# Patient Record
Sex: Female | Born: 1935 | Race: White | Hispanic: No | Smoking: Current some day smoker
Health system: Southern US, Community
[De-identification: ages and names within clinical notes are randomized; demographics above are authoritative.]

## PROBLEM LIST (undated history)

## (undated) DIAGNOSIS — N390 Urinary tract infection, site not specified: Secondary | ICD-10-CM

## (undated) DIAGNOSIS — C50919 Malignant neoplasm of unspecified site of unspecified female breast: Secondary | ICD-10-CM

## (undated) DIAGNOSIS — I10 Essential (primary) hypertension: Secondary | ICD-10-CM

## (undated) DIAGNOSIS — J449 Chronic obstructive pulmonary disease, unspecified: Secondary | ICD-10-CM

## (undated) DIAGNOSIS — K449 Diaphragmatic hernia without obstruction or gangrene: Secondary | ICD-10-CM

## (undated) DIAGNOSIS — R011 Cardiac murmur, unspecified: Secondary | ICD-10-CM

## (undated) HISTORY — DX: Diaphragmatic hernia without obstruction or gangrene: K44.9

## (undated) HISTORY — DX: Essential (primary) hypertension: I10

## (undated) HISTORY — DX: Urinary tract infection, site not specified: N39.0

## (undated) HISTORY — PX: MASTECTOMY: SHX3

## (undated) HISTORY — DX: Malignant neoplasm of unspecified site of unspecified female breast: C50.919

## (undated) HISTORY — PX: FOOT SURGERY: SHX648

## (undated) HISTORY — PX: ABDOMINAL HYSTERECTOMY: SHX81

---

## 2004-09-05 ENCOUNTER — Ambulatory Visit: Payer: Self-pay | Admitting: Oncology

## 2004-09-18 ENCOUNTER — Ambulatory Visit: Payer: Self-pay | Admitting: Oncology

## 2004-10-08 ENCOUNTER — Ambulatory Visit: Payer: Self-pay | Admitting: General Practice

## 2005-03-06 ENCOUNTER — Ambulatory Visit: Payer: Self-pay | Admitting: Oncology

## 2005-03-18 ENCOUNTER — Ambulatory Visit: Payer: Self-pay | Admitting: Oncology

## 2005-07-25 ENCOUNTER — Ambulatory Visit: Payer: Self-pay | Admitting: Internal Medicine

## 2005-09-04 ENCOUNTER — Ambulatory Visit: Payer: Self-pay | Admitting: Oncology

## 2005-09-18 ENCOUNTER — Ambulatory Visit: Payer: Self-pay | Admitting: Oncology

## 2005-10-31 ENCOUNTER — Ambulatory Visit: Payer: Self-pay | Admitting: Gastroenterology

## 2005-11-08 ENCOUNTER — Ambulatory Visit: Payer: Self-pay | Admitting: Gastroenterology

## 2006-03-03 ENCOUNTER — Ambulatory Visit: Payer: Self-pay | Admitting: Oncology

## 2006-03-18 ENCOUNTER — Ambulatory Visit: Payer: Self-pay | Admitting: Oncology

## 2006-07-29 ENCOUNTER — Ambulatory Visit: Payer: Self-pay | Admitting: Internal Medicine

## 2006-09-03 ENCOUNTER — Ambulatory Visit: Payer: Self-pay | Admitting: Oncology

## 2006-09-18 ENCOUNTER — Ambulatory Visit: Payer: Self-pay | Admitting: Oncology

## 2007-02-17 ENCOUNTER — Ambulatory Visit: Payer: Self-pay | Admitting: Oncology

## 2007-03-04 ENCOUNTER — Ambulatory Visit: Payer: Self-pay | Admitting: Oncology

## 2007-03-19 ENCOUNTER — Ambulatory Visit: Payer: Self-pay | Admitting: Oncology

## 2007-04-21 ENCOUNTER — Ambulatory Visit: Payer: Self-pay | Admitting: Internal Medicine

## 2007-08-19 ENCOUNTER — Ambulatory Visit: Payer: Self-pay | Admitting: Oncology

## 2007-08-21 ENCOUNTER — Ambulatory Visit: Payer: Self-pay | Admitting: Oncology

## 2007-08-27 ENCOUNTER — Ambulatory Visit: Payer: Self-pay | Admitting: Oncology

## 2007-09-19 ENCOUNTER — Ambulatory Visit: Payer: Self-pay | Admitting: Oncology

## 2007-09-24 ENCOUNTER — Ambulatory Visit: Payer: Self-pay | Admitting: Internal Medicine

## 2007-11-17 ENCOUNTER — Ambulatory Visit: Payer: Self-pay | Admitting: Gastroenterology

## 2008-02-23 ENCOUNTER — Ambulatory Visit: Payer: Self-pay | Admitting: Oncology

## 2008-03-18 ENCOUNTER — Ambulatory Visit: Payer: Self-pay | Admitting: Oncology

## 2008-08-24 ENCOUNTER — Ambulatory Visit: Payer: Self-pay | Admitting: Oncology

## 2008-08-30 ENCOUNTER — Ambulatory Visit: Payer: Self-pay | Admitting: Oncology

## 2008-09-18 ENCOUNTER — Ambulatory Visit: Payer: Self-pay | Admitting: Oncology

## 2009-01-09 ENCOUNTER — Ambulatory Visit: Payer: Self-pay | Admitting: Internal Medicine

## 2009-02-16 ENCOUNTER — Ambulatory Visit: Payer: Self-pay | Admitting: Oncology

## 2009-02-28 ENCOUNTER — Ambulatory Visit: Payer: Self-pay | Admitting: Oncology

## 2009-03-18 ENCOUNTER — Ambulatory Visit: Payer: Self-pay | Admitting: Oncology

## 2009-08-18 ENCOUNTER — Ambulatory Visit: Payer: Self-pay | Admitting: Oncology

## 2009-08-29 ENCOUNTER — Ambulatory Visit: Payer: Self-pay | Admitting: Oncology

## 2009-09-18 ENCOUNTER — Ambulatory Visit: Payer: Self-pay | Admitting: Oncology

## 2010-02-16 ENCOUNTER — Ambulatory Visit: Payer: Self-pay | Admitting: Oncology

## 2010-02-26 ENCOUNTER — Ambulatory Visit: Payer: Self-pay | Admitting: Oncology

## 2010-03-15 ENCOUNTER — Ambulatory Visit: Payer: Self-pay | Admitting: Internal Medicine

## 2010-03-18 ENCOUNTER — Ambulatory Visit: Payer: Self-pay | Admitting: Oncology

## 2010-08-18 ENCOUNTER — Ambulatory Visit: Payer: Self-pay | Admitting: Oncology

## 2010-08-28 ENCOUNTER — Ambulatory Visit: Payer: Self-pay | Admitting: Radiation Oncology

## 2010-09-18 ENCOUNTER — Ambulatory Visit: Payer: Self-pay | Admitting: Radiation Oncology

## 2011-03-20 ENCOUNTER — Ambulatory Visit: Payer: Self-pay | Admitting: Oncology

## 2011-03-21 ENCOUNTER — Ambulatory Visit: Payer: Self-pay | Admitting: Oncology

## 2011-04-19 ENCOUNTER — Ambulatory Visit: Payer: Self-pay | Admitting: Oncology

## 2011-05-19 ENCOUNTER — Ambulatory Visit: Payer: Self-pay | Admitting: Oncology

## 2011-06-19 ENCOUNTER — Ambulatory Visit: Payer: Self-pay | Admitting: Oncology

## 2011-07-20 ENCOUNTER — Ambulatory Visit: Payer: Self-pay | Admitting: Oncology

## 2011-08-29 ENCOUNTER — Ambulatory Visit: Payer: Self-pay | Admitting: Oncology

## 2011-09-19 ENCOUNTER — Ambulatory Visit: Payer: Self-pay | Admitting: Oncology

## 2011-10-24 ENCOUNTER — Ambulatory Visit: Payer: Self-pay | Admitting: Oncology

## 2011-11-19 ENCOUNTER — Ambulatory Visit: Payer: Self-pay | Admitting: Oncology

## 2011-12-19 DIAGNOSIS — L570 Actinic keratosis: Secondary | ICD-10-CM | POA: Diagnosis not present

## 2011-12-19 DIAGNOSIS — Z85828 Personal history of other malignant neoplasm of skin: Secondary | ICD-10-CM | POA: Diagnosis not present

## 2012-01-28 ENCOUNTER — Ambulatory Visit: Payer: Self-pay | Admitting: Oncology

## 2012-01-28 DIAGNOSIS — D649 Anemia, unspecified: Secondary | ICD-10-CM | POA: Diagnosis not present

## 2012-01-28 LAB — CANCER CENTER HEMOGLOBIN: HGB: 9.5 g/dL — ABNORMAL LOW (ref 12.0–16.0)

## 2012-02-07 DIAGNOSIS — M546 Pain in thoracic spine: Secondary | ICD-10-CM | POA: Diagnosis not present

## 2012-02-07 DIAGNOSIS — M5020 Other cervical disc displacement, unspecified cervical region: Secondary | ICD-10-CM | POA: Diagnosis not present

## 2012-02-07 DIAGNOSIS — M999 Biomechanical lesion, unspecified: Secondary | ICD-10-CM | POA: Diagnosis not present

## 2012-02-07 DIAGNOSIS — M9981 Other biomechanical lesions of cervical region: Secondary | ICD-10-CM | POA: Diagnosis not present

## 2012-02-10 DIAGNOSIS — M546 Pain in thoracic spine: Secondary | ICD-10-CM | POA: Diagnosis not present

## 2012-02-10 DIAGNOSIS — M9981 Other biomechanical lesions of cervical region: Secondary | ICD-10-CM | POA: Diagnosis not present

## 2012-02-10 DIAGNOSIS — M5020 Other cervical disc displacement, unspecified cervical region: Secondary | ICD-10-CM | POA: Diagnosis not present

## 2012-02-10 DIAGNOSIS — M999 Biomechanical lesion, unspecified: Secondary | ICD-10-CM | POA: Diagnosis not present

## 2012-02-11 DIAGNOSIS — M546 Pain in thoracic spine: Secondary | ICD-10-CM | POA: Diagnosis not present

## 2012-02-11 DIAGNOSIS — M999 Biomechanical lesion, unspecified: Secondary | ICD-10-CM | POA: Diagnosis not present

## 2012-02-11 DIAGNOSIS — M5020 Other cervical disc displacement, unspecified cervical region: Secondary | ICD-10-CM | POA: Diagnosis not present

## 2012-02-11 DIAGNOSIS — M9981 Other biomechanical lesions of cervical region: Secondary | ICD-10-CM | POA: Diagnosis not present

## 2012-02-12 DIAGNOSIS — M546 Pain in thoracic spine: Secondary | ICD-10-CM | POA: Diagnosis not present

## 2012-02-12 DIAGNOSIS — M999 Biomechanical lesion, unspecified: Secondary | ICD-10-CM | POA: Diagnosis not present

## 2012-02-12 DIAGNOSIS — M5020 Other cervical disc displacement, unspecified cervical region: Secondary | ICD-10-CM | POA: Diagnosis not present

## 2012-02-12 DIAGNOSIS — M9981 Other biomechanical lesions of cervical region: Secondary | ICD-10-CM | POA: Diagnosis not present

## 2012-02-17 ENCOUNTER — Ambulatory Visit: Payer: Self-pay | Admitting: Oncology

## 2012-02-18 DIAGNOSIS — M9981 Other biomechanical lesions of cervical region: Secondary | ICD-10-CM | POA: Diagnosis not present

## 2012-02-18 DIAGNOSIS — M546 Pain in thoracic spine: Secondary | ICD-10-CM | POA: Diagnosis not present

## 2012-02-18 DIAGNOSIS — M5020 Other cervical disc displacement, unspecified cervical region: Secondary | ICD-10-CM | POA: Diagnosis not present

## 2012-02-18 DIAGNOSIS — M999 Biomechanical lesion, unspecified: Secondary | ICD-10-CM | POA: Diagnosis not present

## 2012-02-19 DIAGNOSIS — M546 Pain in thoracic spine: Secondary | ICD-10-CM | POA: Diagnosis not present

## 2012-02-19 DIAGNOSIS — M9981 Other biomechanical lesions of cervical region: Secondary | ICD-10-CM | POA: Diagnosis not present

## 2012-02-19 DIAGNOSIS — M999 Biomechanical lesion, unspecified: Secondary | ICD-10-CM | POA: Diagnosis not present

## 2012-02-19 DIAGNOSIS — M5020 Other cervical disc displacement, unspecified cervical region: Secondary | ICD-10-CM | POA: Diagnosis not present

## 2012-02-21 DIAGNOSIS — M9981 Other biomechanical lesions of cervical region: Secondary | ICD-10-CM | POA: Diagnosis not present

## 2012-02-21 DIAGNOSIS — M999 Biomechanical lesion, unspecified: Secondary | ICD-10-CM | POA: Diagnosis not present

## 2012-02-21 DIAGNOSIS — M546 Pain in thoracic spine: Secondary | ICD-10-CM | POA: Diagnosis not present

## 2012-02-21 DIAGNOSIS — M5020 Other cervical disc displacement, unspecified cervical region: Secondary | ICD-10-CM | POA: Diagnosis not present

## 2012-02-24 DIAGNOSIS — M9981 Other biomechanical lesions of cervical region: Secondary | ICD-10-CM | POA: Diagnosis not present

## 2012-02-24 DIAGNOSIS — M999 Biomechanical lesion, unspecified: Secondary | ICD-10-CM | POA: Diagnosis not present

## 2012-02-24 DIAGNOSIS — M546 Pain in thoracic spine: Secondary | ICD-10-CM | POA: Diagnosis not present

## 2012-02-24 DIAGNOSIS — M5020 Other cervical disc displacement, unspecified cervical region: Secondary | ICD-10-CM | POA: Diagnosis not present

## 2012-02-26 DIAGNOSIS — M5020 Other cervical disc displacement, unspecified cervical region: Secondary | ICD-10-CM | POA: Diagnosis not present

## 2012-02-26 DIAGNOSIS — M546 Pain in thoracic spine: Secondary | ICD-10-CM | POA: Diagnosis not present

## 2012-02-26 DIAGNOSIS — M999 Biomechanical lesion, unspecified: Secondary | ICD-10-CM | POA: Diagnosis not present

## 2012-02-26 DIAGNOSIS — M9981 Other biomechanical lesions of cervical region: Secondary | ICD-10-CM | POA: Diagnosis not present

## 2012-02-28 DIAGNOSIS — M5020 Other cervical disc displacement, unspecified cervical region: Secondary | ICD-10-CM | POA: Diagnosis not present

## 2012-02-28 DIAGNOSIS — M546 Pain in thoracic spine: Secondary | ICD-10-CM | POA: Diagnosis not present

## 2012-02-28 DIAGNOSIS — M9981 Other biomechanical lesions of cervical region: Secondary | ICD-10-CM | POA: Diagnosis not present

## 2012-02-28 DIAGNOSIS — M999 Biomechanical lesion, unspecified: Secondary | ICD-10-CM | POA: Diagnosis not present

## 2012-03-02 DIAGNOSIS — M5020 Other cervical disc displacement, unspecified cervical region: Secondary | ICD-10-CM | POA: Diagnosis not present

## 2012-03-02 DIAGNOSIS — M9981 Other biomechanical lesions of cervical region: Secondary | ICD-10-CM | POA: Diagnosis not present

## 2012-03-02 DIAGNOSIS — M546 Pain in thoracic spine: Secondary | ICD-10-CM | POA: Diagnosis not present

## 2012-03-02 DIAGNOSIS — M999 Biomechanical lesion, unspecified: Secondary | ICD-10-CM | POA: Diagnosis not present

## 2012-03-04 DIAGNOSIS — M999 Biomechanical lesion, unspecified: Secondary | ICD-10-CM | POA: Diagnosis not present

## 2012-03-04 DIAGNOSIS — D649 Anemia, unspecified: Secondary | ICD-10-CM | POA: Diagnosis not present

## 2012-03-04 DIAGNOSIS — M5020 Other cervical disc displacement, unspecified cervical region: Secondary | ICD-10-CM | POA: Diagnosis not present

## 2012-03-04 DIAGNOSIS — M546 Pain in thoracic spine: Secondary | ICD-10-CM | POA: Diagnosis not present

## 2012-03-04 DIAGNOSIS — E785 Hyperlipidemia, unspecified: Secondary | ICD-10-CM | POA: Diagnosis not present

## 2012-03-04 DIAGNOSIS — E878 Other disorders of electrolyte and fluid balance, not elsewhere classified: Secondary | ICD-10-CM | POA: Diagnosis not present

## 2012-03-04 DIAGNOSIS — E039 Hypothyroidism, unspecified: Secondary | ICD-10-CM | POA: Diagnosis not present

## 2012-03-04 DIAGNOSIS — M9981 Other biomechanical lesions of cervical region: Secondary | ICD-10-CM | POA: Diagnosis not present

## 2012-03-04 DIAGNOSIS — E7800 Pure hypercholesterolemia, unspecified: Secondary | ICD-10-CM | POA: Diagnosis not present

## 2012-03-04 DIAGNOSIS — M1990 Unspecified osteoarthritis, unspecified site: Secondary | ICD-10-CM | POA: Diagnosis not present

## 2012-03-06 DIAGNOSIS — M999 Biomechanical lesion, unspecified: Secondary | ICD-10-CM | POA: Diagnosis not present

## 2012-03-06 DIAGNOSIS — M5020 Other cervical disc displacement, unspecified cervical region: Secondary | ICD-10-CM | POA: Diagnosis not present

## 2012-03-06 DIAGNOSIS — M9981 Other biomechanical lesions of cervical region: Secondary | ICD-10-CM | POA: Diagnosis not present

## 2012-03-06 DIAGNOSIS — M546 Pain in thoracic spine: Secondary | ICD-10-CM | POA: Diagnosis not present

## 2012-03-09 DIAGNOSIS — M5020 Other cervical disc displacement, unspecified cervical region: Secondary | ICD-10-CM | POA: Diagnosis not present

## 2012-03-09 DIAGNOSIS — M9981 Other biomechanical lesions of cervical region: Secondary | ICD-10-CM | POA: Diagnosis not present

## 2012-03-09 DIAGNOSIS — M999 Biomechanical lesion, unspecified: Secondary | ICD-10-CM | POA: Diagnosis not present

## 2012-03-09 DIAGNOSIS — M546 Pain in thoracic spine: Secondary | ICD-10-CM | POA: Diagnosis not present

## 2012-03-11 DIAGNOSIS — M999 Biomechanical lesion, unspecified: Secondary | ICD-10-CM | POA: Diagnosis not present

## 2012-03-11 DIAGNOSIS — M5020 Other cervical disc displacement, unspecified cervical region: Secondary | ICD-10-CM | POA: Diagnosis not present

## 2012-03-11 DIAGNOSIS — M9981 Other biomechanical lesions of cervical region: Secondary | ICD-10-CM | POA: Diagnosis not present

## 2012-03-11 DIAGNOSIS — M546 Pain in thoracic spine: Secondary | ICD-10-CM | POA: Diagnosis not present

## 2012-03-13 DIAGNOSIS — M5020 Other cervical disc displacement, unspecified cervical region: Secondary | ICD-10-CM | POA: Diagnosis not present

## 2012-03-13 DIAGNOSIS — M546 Pain in thoracic spine: Secondary | ICD-10-CM | POA: Diagnosis not present

## 2012-03-13 DIAGNOSIS — M9981 Other biomechanical lesions of cervical region: Secondary | ICD-10-CM | POA: Diagnosis not present

## 2012-03-13 DIAGNOSIS — M999 Biomechanical lesion, unspecified: Secondary | ICD-10-CM | POA: Diagnosis not present

## 2012-03-16 DIAGNOSIS — M9981 Other biomechanical lesions of cervical region: Secondary | ICD-10-CM | POA: Diagnosis not present

## 2012-03-16 DIAGNOSIS — M546 Pain in thoracic spine: Secondary | ICD-10-CM | POA: Diagnosis not present

## 2012-03-16 DIAGNOSIS — M5020 Other cervical disc displacement, unspecified cervical region: Secondary | ICD-10-CM | POA: Diagnosis not present

## 2012-03-16 DIAGNOSIS — M999 Biomechanical lesion, unspecified: Secondary | ICD-10-CM | POA: Diagnosis not present

## 2012-03-18 DIAGNOSIS — M546 Pain in thoracic spine: Secondary | ICD-10-CM | POA: Diagnosis not present

## 2012-03-18 DIAGNOSIS — M5020 Other cervical disc displacement, unspecified cervical region: Secondary | ICD-10-CM | POA: Diagnosis not present

## 2012-03-18 DIAGNOSIS — M999 Biomechanical lesion, unspecified: Secondary | ICD-10-CM | POA: Diagnosis not present

## 2012-03-18 DIAGNOSIS — M9981 Other biomechanical lesions of cervical region: Secondary | ICD-10-CM | POA: Diagnosis not present

## 2012-03-20 DIAGNOSIS — M999 Biomechanical lesion, unspecified: Secondary | ICD-10-CM | POA: Diagnosis not present

## 2012-03-20 DIAGNOSIS — M9981 Other biomechanical lesions of cervical region: Secondary | ICD-10-CM | POA: Diagnosis not present

## 2012-03-20 DIAGNOSIS — M546 Pain in thoracic spine: Secondary | ICD-10-CM | POA: Diagnosis not present

## 2012-03-20 DIAGNOSIS — M5020 Other cervical disc displacement, unspecified cervical region: Secondary | ICD-10-CM | POA: Diagnosis not present

## 2012-03-23 DIAGNOSIS — M999 Biomechanical lesion, unspecified: Secondary | ICD-10-CM | POA: Diagnosis not present

## 2012-03-23 DIAGNOSIS — M9981 Other biomechanical lesions of cervical region: Secondary | ICD-10-CM | POA: Diagnosis not present

## 2012-03-23 DIAGNOSIS — M5020 Other cervical disc displacement, unspecified cervical region: Secondary | ICD-10-CM | POA: Diagnosis not present

## 2012-03-23 DIAGNOSIS — M546 Pain in thoracic spine: Secondary | ICD-10-CM | POA: Diagnosis not present

## 2012-03-25 DIAGNOSIS — M9981 Other biomechanical lesions of cervical region: Secondary | ICD-10-CM | POA: Diagnosis not present

## 2012-03-25 DIAGNOSIS — M546 Pain in thoracic spine: Secondary | ICD-10-CM | POA: Diagnosis not present

## 2012-03-25 DIAGNOSIS — M999 Biomechanical lesion, unspecified: Secondary | ICD-10-CM | POA: Diagnosis not present

## 2012-03-25 DIAGNOSIS — M5020 Other cervical disc displacement, unspecified cervical region: Secondary | ICD-10-CM | POA: Diagnosis not present

## 2012-03-27 DIAGNOSIS — M9981 Other biomechanical lesions of cervical region: Secondary | ICD-10-CM | POA: Diagnosis not present

## 2012-03-27 DIAGNOSIS — M5020 Other cervical disc displacement, unspecified cervical region: Secondary | ICD-10-CM | POA: Diagnosis not present

## 2012-03-27 DIAGNOSIS — M546 Pain in thoracic spine: Secondary | ICD-10-CM | POA: Diagnosis not present

## 2012-03-27 DIAGNOSIS — M999 Biomechanical lesion, unspecified: Secondary | ICD-10-CM | POA: Diagnosis not present

## 2012-03-30 DIAGNOSIS — M5020 Other cervical disc displacement, unspecified cervical region: Secondary | ICD-10-CM | POA: Diagnosis not present

## 2012-03-30 DIAGNOSIS — M999 Biomechanical lesion, unspecified: Secondary | ICD-10-CM | POA: Diagnosis not present

## 2012-03-30 DIAGNOSIS — M9981 Other biomechanical lesions of cervical region: Secondary | ICD-10-CM | POA: Diagnosis not present

## 2012-03-30 DIAGNOSIS — M546 Pain in thoracic spine: Secondary | ICD-10-CM | POA: Diagnosis not present

## 2012-03-31 DIAGNOSIS — N3946 Mixed incontinence: Secondary | ICD-10-CM | POA: Diagnosis not present

## 2012-03-31 DIAGNOSIS — N8111 Cystocele, midline: Secondary | ICD-10-CM | POA: Diagnosis not present

## 2012-03-31 DIAGNOSIS — R351 Nocturia: Secondary | ICD-10-CM | POA: Diagnosis not present

## 2012-03-31 DIAGNOSIS — R339 Retention of urine, unspecified: Secondary | ICD-10-CM | POA: Diagnosis not present

## 2012-04-01 DIAGNOSIS — M9981 Other biomechanical lesions of cervical region: Secondary | ICD-10-CM | POA: Diagnosis not present

## 2012-04-01 DIAGNOSIS — M5020 Other cervical disc displacement, unspecified cervical region: Secondary | ICD-10-CM | POA: Diagnosis not present

## 2012-04-01 DIAGNOSIS — M999 Biomechanical lesion, unspecified: Secondary | ICD-10-CM | POA: Diagnosis not present

## 2012-04-01 DIAGNOSIS — M546 Pain in thoracic spine: Secondary | ICD-10-CM | POA: Diagnosis not present

## 2012-04-23 ENCOUNTER — Ambulatory Visit: Payer: Self-pay | Admitting: Oncology

## 2012-04-23 DIAGNOSIS — I10 Essential (primary) hypertension: Secondary | ICD-10-CM | POA: Diagnosis not present

## 2012-04-23 DIAGNOSIS — Z9223 Personal history of estrogen therapy: Secondary | ICD-10-CM | POA: Diagnosis not present

## 2012-04-23 DIAGNOSIS — Z9010 Acquired absence of unspecified breast and nipple: Secondary | ICD-10-CM | POA: Diagnosis not present

## 2012-04-23 DIAGNOSIS — D649 Anemia, unspecified: Secondary | ICD-10-CM | POA: Diagnosis not present

## 2012-04-23 DIAGNOSIS — Z853 Personal history of malignant neoplasm of breast: Secondary | ICD-10-CM | POA: Diagnosis not present

## 2012-04-23 DIAGNOSIS — Z79899 Other long term (current) drug therapy: Secondary | ICD-10-CM | POA: Diagnosis not present

## 2012-04-23 LAB — CBC CANCER CENTER
Basophil #: 0 x10 3/mm (ref 0.0–0.1)
Basophil %: 0.3 %
Eosinophil %: 0.4 %
HCT: 28.8 % — ABNORMAL LOW (ref 35.0–47.0)
Lymphocyte #: 1 x10 3/mm (ref 1.0–3.6)
Lymphocyte %: 39.2 %
MCV: 87 fL (ref 80–100)
Monocyte #: 0.3 x10 3/mm (ref 0.2–0.9)
RDW: 14.8 % — ABNORMAL HIGH (ref 11.5–14.5)
WBC: 2.5 x10 3/mm — ABNORMAL LOW (ref 3.6–11.0)

## 2012-04-24 DIAGNOSIS — D638 Anemia in other chronic diseases classified elsewhere: Secondary | ICD-10-CM | POA: Diagnosis not present

## 2012-04-24 DIAGNOSIS — Q842 Other congenital malformations of hair: Secondary | ICD-10-CM | POA: Diagnosis not present

## 2012-04-24 DIAGNOSIS — M5030 Other cervical disc degeneration, unspecified cervical region: Secondary | ICD-10-CM | POA: Diagnosis not present

## 2012-05-14 ENCOUNTER — Ambulatory Visit: Payer: Self-pay | Admitting: Internal Medicine

## 2012-05-14 DIAGNOSIS — Z1231 Encounter for screening mammogram for malignant neoplasm of breast: Secondary | ICD-10-CM | POA: Diagnosis not present

## 2012-05-14 DIAGNOSIS — Z853 Personal history of malignant neoplasm of breast: Secondary | ICD-10-CM | POA: Diagnosis not present

## 2012-05-18 ENCOUNTER — Ambulatory Visit: Payer: Self-pay | Admitting: Oncology

## 2012-06-25 DIAGNOSIS — R5381 Other malaise: Secondary | ICD-10-CM | POA: Diagnosis not present

## 2012-06-25 DIAGNOSIS — G4700 Insomnia, unspecified: Secondary | ICD-10-CM | POA: Diagnosis not present

## 2012-06-25 DIAGNOSIS — D638 Anemia in other chronic diseases classified elsewhere: Secondary | ICD-10-CM | POA: Diagnosis not present

## 2012-07-30 ENCOUNTER — Ambulatory Visit: Payer: Self-pay | Admitting: Oncology

## 2012-07-30 DIAGNOSIS — Z9010 Acquired absence of unspecified breast and nipple: Secondary | ICD-10-CM | POA: Diagnosis not present

## 2012-07-30 DIAGNOSIS — Z79899 Other long term (current) drug therapy: Secondary | ICD-10-CM | POA: Diagnosis not present

## 2012-07-30 DIAGNOSIS — Z853 Personal history of malignant neoplasm of breast: Secondary | ICD-10-CM | POA: Diagnosis not present

## 2012-07-30 DIAGNOSIS — Z9223 Personal history of estrogen therapy: Secondary | ICD-10-CM | POA: Diagnosis not present

## 2012-07-30 DIAGNOSIS — D649 Anemia, unspecified: Secondary | ICD-10-CM | POA: Diagnosis not present

## 2012-07-30 DIAGNOSIS — I10 Essential (primary) hypertension: Secondary | ICD-10-CM | POA: Diagnosis not present

## 2012-07-30 LAB — CANCER CENTER HEMOGLOBIN: HGB: 9.1 g/dL — ABNORMAL LOW (ref 12.0–16.0)

## 2012-08-06 DIAGNOSIS — H2510 Age-related nuclear cataract, unspecified eye: Secondary | ICD-10-CM | POA: Diagnosis not present

## 2012-08-18 ENCOUNTER — Ambulatory Visit: Payer: Self-pay | Admitting: Oncology

## 2012-08-18 DIAGNOSIS — Z85828 Personal history of other malignant neoplasm of skin: Secondary | ICD-10-CM | POA: Diagnosis not present

## 2012-08-18 DIAGNOSIS — D649 Anemia, unspecified: Secondary | ICD-10-CM | POA: Diagnosis not present

## 2012-08-18 DIAGNOSIS — Z853 Personal history of malignant neoplasm of breast: Secondary | ICD-10-CM | POA: Diagnosis not present

## 2012-08-18 DIAGNOSIS — D485 Neoplasm of uncertain behavior of skin: Secondary | ICD-10-CM | POA: Diagnosis not present

## 2012-09-10 LAB — CANCER CENTER HEMOGLOBIN: HGB: 10.1 g/dL — ABNORMAL LOW (ref 12.0–16.0)

## 2012-09-18 ENCOUNTER — Ambulatory Visit: Payer: Self-pay | Admitting: Oncology

## 2012-09-21 DIAGNOSIS — C44611 Basal cell carcinoma of skin of unspecified upper limb, including shoulder: Secondary | ICD-10-CM | POA: Diagnosis not present

## 2012-09-21 DIAGNOSIS — L570 Actinic keratosis: Secondary | ICD-10-CM | POA: Diagnosis not present

## 2012-09-21 DIAGNOSIS — C44320 Squamous cell carcinoma of skin of unspecified parts of face: Secondary | ICD-10-CM | POA: Diagnosis not present

## 2012-09-22 DIAGNOSIS — M5030 Other cervical disc degeneration, unspecified cervical region: Secondary | ICD-10-CM | POA: Diagnosis not present

## 2012-09-22 DIAGNOSIS — Z23 Encounter for immunization: Secondary | ICD-10-CM | POA: Diagnosis not present

## 2012-09-22 DIAGNOSIS — IMO0001 Reserved for inherently not codable concepts without codable children: Secondary | ICD-10-CM | POA: Diagnosis not present

## 2012-09-22 DIAGNOSIS — I10 Essential (primary) hypertension: Secondary | ICD-10-CM | POA: Diagnosis not present

## 2012-09-22 DIAGNOSIS — D638 Anemia in other chronic diseases classified elsewhere: Secondary | ICD-10-CM | POA: Diagnosis not present

## 2012-09-29 DIAGNOSIS — R82998 Other abnormal findings in urine: Secondary | ICD-10-CM | POA: Diagnosis not present

## 2012-09-29 DIAGNOSIS — N3946 Mixed incontinence: Secondary | ICD-10-CM | POA: Diagnosis not present

## 2012-09-29 DIAGNOSIS — R339 Retention of urine, unspecified: Secondary | ICD-10-CM | POA: Diagnosis not present

## 2012-10-22 ENCOUNTER — Ambulatory Visit: Payer: Self-pay | Admitting: Oncology

## 2012-10-22 DIAGNOSIS — Z853 Personal history of malignant neoplasm of breast: Secondary | ICD-10-CM | POA: Diagnosis not present

## 2012-10-22 DIAGNOSIS — D649 Anemia, unspecified: Secondary | ICD-10-CM | POA: Diagnosis not present

## 2012-11-18 ENCOUNTER — Ambulatory Visit: Payer: Self-pay | Admitting: Oncology

## 2012-11-18 DIAGNOSIS — D649 Anemia, unspecified: Secondary | ICD-10-CM | POA: Diagnosis not present

## 2012-11-25 DIAGNOSIS — M5020 Other cervical disc displacement, unspecified cervical region: Secondary | ICD-10-CM | POA: Diagnosis not present

## 2012-11-25 DIAGNOSIS — M999 Biomechanical lesion, unspecified: Secondary | ICD-10-CM | POA: Diagnosis not present

## 2012-11-25 DIAGNOSIS — M9981 Other biomechanical lesions of cervical region: Secondary | ICD-10-CM | POA: Diagnosis not present

## 2012-11-25 DIAGNOSIS — M5126 Other intervertebral disc displacement, lumbar region: Secondary | ICD-10-CM | POA: Diagnosis not present

## 2012-11-30 DIAGNOSIS — M5020 Other cervical disc displacement, unspecified cervical region: Secondary | ICD-10-CM | POA: Diagnosis not present

## 2012-11-30 DIAGNOSIS — M5126 Other intervertebral disc displacement, lumbar region: Secondary | ICD-10-CM | POA: Diagnosis not present

## 2012-11-30 DIAGNOSIS — M9981 Other biomechanical lesions of cervical region: Secondary | ICD-10-CM | POA: Diagnosis not present

## 2012-11-30 DIAGNOSIS — M999 Biomechanical lesion, unspecified: Secondary | ICD-10-CM | POA: Diagnosis not present

## 2012-11-30 DIAGNOSIS — M546 Pain in thoracic spine: Secondary | ICD-10-CM | POA: Diagnosis not present

## 2012-12-02 DIAGNOSIS — M9981 Other biomechanical lesions of cervical region: Secondary | ICD-10-CM | POA: Diagnosis not present

## 2012-12-02 DIAGNOSIS — M999 Biomechanical lesion, unspecified: Secondary | ICD-10-CM | POA: Diagnosis not present

## 2012-12-02 DIAGNOSIS — M5126 Other intervertebral disc displacement, lumbar region: Secondary | ICD-10-CM | POA: Diagnosis not present

## 2012-12-02 DIAGNOSIS — M5020 Other cervical disc displacement, unspecified cervical region: Secondary | ICD-10-CM | POA: Diagnosis not present

## 2012-12-03 LAB — CANCER CENTER HEMOGLOBIN: HGB: 9.8 g/dL — ABNORMAL LOW (ref 12.0–16.0)

## 2012-12-09 DIAGNOSIS — M5020 Other cervical disc displacement, unspecified cervical region: Secondary | ICD-10-CM | POA: Diagnosis not present

## 2012-12-09 DIAGNOSIS — M5126 Other intervertebral disc displacement, lumbar region: Secondary | ICD-10-CM | POA: Diagnosis not present

## 2012-12-09 DIAGNOSIS — M9981 Other biomechanical lesions of cervical region: Secondary | ICD-10-CM | POA: Diagnosis not present

## 2012-12-09 DIAGNOSIS — M999 Biomechanical lesion, unspecified: Secondary | ICD-10-CM | POA: Diagnosis not present

## 2012-12-18 DIAGNOSIS — M5020 Other cervical disc displacement, unspecified cervical region: Secondary | ICD-10-CM | POA: Diagnosis not present

## 2012-12-18 DIAGNOSIS — M5126 Other intervertebral disc displacement, lumbar region: Secondary | ICD-10-CM | POA: Diagnosis not present

## 2012-12-18 DIAGNOSIS — M999 Biomechanical lesion, unspecified: Secondary | ICD-10-CM | POA: Diagnosis not present

## 2012-12-18 DIAGNOSIS — M9981 Other biomechanical lesions of cervical region: Secondary | ICD-10-CM | POA: Diagnosis not present

## 2012-12-19 ENCOUNTER — Ambulatory Visit: Payer: Self-pay | Admitting: Oncology

## 2012-12-19 DIAGNOSIS — D649 Anemia, unspecified: Secondary | ICD-10-CM | POA: Diagnosis not present

## 2012-12-21 DIAGNOSIS — M5126 Other intervertebral disc displacement, lumbar region: Secondary | ICD-10-CM | POA: Diagnosis not present

## 2012-12-21 DIAGNOSIS — M999 Biomechanical lesion, unspecified: Secondary | ICD-10-CM | POA: Diagnosis not present

## 2012-12-21 DIAGNOSIS — M9981 Other biomechanical lesions of cervical region: Secondary | ICD-10-CM | POA: Diagnosis not present

## 2012-12-21 DIAGNOSIS — M5020 Other cervical disc displacement, unspecified cervical region: Secondary | ICD-10-CM | POA: Diagnosis not present

## 2012-12-22 DIAGNOSIS — L570 Actinic keratosis: Secondary | ICD-10-CM | POA: Diagnosis not present

## 2012-12-22 DIAGNOSIS — M5030 Other cervical disc degeneration, unspecified cervical region: Secondary | ICD-10-CM | POA: Diagnosis not present

## 2012-12-22 DIAGNOSIS — D638 Anemia in other chronic diseases classified elsewhere: Secondary | ICD-10-CM | POA: Diagnosis not present

## 2012-12-22 DIAGNOSIS — I10 Essential (primary) hypertension: Secondary | ICD-10-CM | POA: Diagnosis not present

## 2012-12-22 DIAGNOSIS — Z85828 Personal history of other malignant neoplasm of skin: Secondary | ICD-10-CM | POA: Diagnosis not present

## 2012-12-23 DIAGNOSIS — M5126 Other intervertebral disc displacement, lumbar region: Secondary | ICD-10-CM | POA: Diagnosis not present

## 2012-12-23 DIAGNOSIS — M5020 Other cervical disc displacement, unspecified cervical region: Secondary | ICD-10-CM | POA: Diagnosis not present

## 2012-12-23 DIAGNOSIS — M9981 Other biomechanical lesions of cervical region: Secondary | ICD-10-CM | POA: Diagnosis not present

## 2012-12-23 DIAGNOSIS — M999 Biomechanical lesion, unspecified: Secondary | ICD-10-CM | POA: Diagnosis not present

## 2012-12-28 DIAGNOSIS — M9981 Other biomechanical lesions of cervical region: Secondary | ICD-10-CM | POA: Diagnosis not present

## 2012-12-28 DIAGNOSIS — M999 Biomechanical lesion, unspecified: Secondary | ICD-10-CM | POA: Diagnosis not present

## 2012-12-28 DIAGNOSIS — M5126 Other intervertebral disc displacement, lumbar region: Secondary | ICD-10-CM | POA: Diagnosis not present

## 2012-12-28 DIAGNOSIS — M5020 Other cervical disc displacement, unspecified cervical region: Secondary | ICD-10-CM | POA: Diagnosis not present

## 2012-12-30 DIAGNOSIS — M5020 Other cervical disc displacement, unspecified cervical region: Secondary | ICD-10-CM | POA: Diagnosis not present

## 2012-12-30 DIAGNOSIS — M999 Biomechanical lesion, unspecified: Secondary | ICD-10-CM | POA: Diagnosis not present

## 2012-12-30 DIAGNOSIS — M5126 Other intervertebral disc displacement, lumbar region: Secondary | ICD-10-CM | POA: Diagnosis not present

## 2012-12-30 DIAGNOSIS — M9981 Other biomechanical lesions of cervical region: Secondary | ICD-10-CM | POA: Diagnosis not present

## 2013-01-04 DIAGNOSIS — M999 Biomechanical lesion, unspecified: Secondary | ICD-10-CM | POA: Diagnosis not present

## 2013-01-04 DIAGNOSIS — M9981 Other biomechanical lesions of cervical region: Secondary | ICD-10-CM | POA: Diagnosis not present

## 2013-01-04 DIAGNOSIS — M5126 Other intervertebral disc displacement, lumbar region: Secondary | ICD-10-CM | POA: Diagnosis not present

## 2013-01-04 DIAGNOSIS — M5020 Other cervical disc displacement, unspecified cervical region: Secondary | ICD-10-CM | POA: Diagnosis not present

## 2013-01-06 DIAGNOSIS — M9981 Other biomechanical lesions of cervical region: Secondary | ICD-10-CM | POA: Diagnosis not present

## 2013-01-06 DIAGNOSIS — M5126 Other intervertebral disc displacement, lumbar region: Secondary | ICD-10-CM | POA: Diagnosis not present

## 2013-01-06 DIAGNOSIS — M999 Biomechanical lesion, unspecified: Secondary | ICD-10-CM | POA: Diagnosis not present

## 2013-01-06 DIAGNOSIS — M5020 Other cervical disc displacement, unspecified cervical region: Secondary | ICD-10-CM | POA: Diagnosis not present

## 2013-01-12 DIAGNOSIS — M2040 Other hammer toe(s) (acquired), unspecified foot: Secondary | ICD-10-CM | POA: Diagnosis not present

## 2013-01-12 DIAGNOSIS — M216X9 Other acquired deformities of unspecified foot: Secondary | ICD-10-CM | POA: Diagnosis not present

## 2013-01-13 DIAGNOSIS — M5020 Other cervical disc displacement, unspecified cervical region: Secondary | ICD-10-CM | POA: Diagnosis not present

## 2013-01-13 DIAGNOSIS — M999 Biomechanical lesion, unspecified: Secondary | ICD-10-CM | POA: Diagnosis not present

## 2013-01-13 DIAGNOSIS — M5126 Other intervertebral disc displacement, lumbar region: Secondary | ICD-10-CM | POA: Diagnosis not present

## 2013-01-13 DIAGNOSIS — M9981 Other biomechanical lesions of cervical region: Secondary | ICD-10-CM | POA: Diagnosis not present

## 2013-01-14 LAB — CANCER CENTER HEMOGLOBIN: HGB: 9.9 g/dL — ABNORMAL LOW (ref 12.0–16.0)

## 2013-01-16 ENCOUNTER — Ambulatory Visit: Payer: Self-pay | Admitting: Oncology

## 2013-01-16 DIAGNOSIS — Z9010 Acquired absence of unspecified breast and nipple: Secondary | ICD-10-CM | POA: Diagnosis not present

## 2013-01-16 DIAGNOSIS — D649 Anemia, unspecified: Secondary | ICD-10-CM | POA: Diagnosis not present

## 2013-01-16 DIAGNOSIS — Z79899 Other long term (current) drug therapy: Secondary | ICD-10-CM | POA: Diagnosis not present

## 2013-01-16 DIAGNOSIS — Z853 Personal history of malignant neoplasm of breast: Secondary | ICD-10-CM | POA: Diagnosis not present

## 2013-01-16 DIAGNOSIS — Z90710 Acquired absence of both cervix and uterus: Secondary | ICD-10-CM | POA: Diagnosis not present

## 2013-01-16 DIAGNOSIS — I10 Essential (primary) hypertension: Secondary | ICD-10-CM | POA: Diagnosis not present

## 2013-01-26 DIAGNOSIS — K219 Gastro-esophageal reflux disease without esophagitis: Secondary | ICD-10-CM | POA: Diagnosis not present

## 2013-01-26 DIAGNOSIS — M2040 Other hammer toe(s) (acquired), unspecified foot: Secondary | ICD-10-CM | POA: Diagnosis not present

## 2013-02-02 DIAGNOSIS — M2040 Other hammer toe(s) (acquired), unspecified foot: Secondary | ICD-10-CM | POA: Diagnosis not present

## 2013-02-10 DIAGNOSIS — H2510 Age-related nuclear cataract, unspecified eye: Secondary | ICD-10-CM | POA: Diagnosis not present

## 2013-02-11 DIAGNOSIS — Z853 Personal history of malignant neoplasm of breast: Secondary | ICD-10-CM | POA: Diagnosis not present

## 2013-02-11 DIAGNOSIS — D649 Anemia, unspecified: Secondary | ICD-10-CM | POA: Diagnosis not present

## 2013-02-11 DIAGNOSIS — Z9010 Acquired absence of unspecified breast and nipple: Secondary | ICD-10-CM | POA: Diagnosis not present

## 2013-02-16 ENCOUNTER — Ambulatory Visit: Payer: Self-pay | Admitting: Oncology

## 2013-03-02 DIAGNOSIS — M2040 Other hammer toe(s) (acquired), unspecified foot: Secondary | ICD-10-CM | POA: Diagnosis not present

## 2013-03-12 ENCOUNTER — Encounter: Payer: Self-pay | Admitting: Podiatry

## 2013-03-23 DIAGNOSIS — IMO0001 Reserved for inherently not codable concepts without codable children: Secondary | ICD-10-CM | POA: Diagnosis not present

## 2013-03-23 DIAGNOSIS — D638 Anemia in other chronic diseases classified elsewhere: Secondary | ICD-10-CM | POA: Diagnosis not present

## 2013-03-23 DIAGNOSIS — M5030 Other cervical disc degeneration, unspecified cervical region: Secondary | ICD-10-CM | POA: Diagnosis not present

## 2013-03-25 ENCOUNTER — Ambulatory Visit: Payer: Self-pay | Admitting: Oncology

## 2013-03-25 DIAGNOSIS — D638 Anemia in other chronic diseases classified elsewhere: Secondary | ICD-10-CM | POA: Diagnosis not present

## 2013-03-25 LAB — CANCER CENTER HEMOGLOBIN: HGB: 9.8 g/dL — ABNORMAL LOW (ref 12.0–16.0)

## 2013-03-29 ENCOUNTER — Ambulatory Visit: Payer: Self-pay | Admitting: Internal Medicine

## 2013-03-29 DIAGNOSIS — M949 Disorder of cartilage, unspecified: Secondary | ICD-10-CM | POA: Diagnosis not present

## 2013-03-29 DIAGNOSIS — M899 Disorder of bone, unspecified: Secondary | ICD-10-CM | POA: Diagnosis not present

## 2013-03-30 DIAGNOSIS — R339 Retention of urine, unspecified: Secondary | ICD-10-CM | POA: Diagnosis not present

## 2013-03-30 DIAGNOSIS — R82998 Other abnormal findings in urine: Secondary | ICD-10-CM | POA: Diagnosis not present

## 2013-04-06 DIAGNOSIS — M2040 Other hammer toe(s) (acquired), unspecified foot: Secondary | ICD-10-CM | POA: Diagnosis not present

## 2013-04-06 DIAGNOSIS — M79609 Pain in unspecified limb: Secondary | ICD-10-CM | POA: Diagnosis not present

## 2013-04-06 DIAGNOSIS — B351 Tinea unguium: Secondary | ICD-10-CM | POA: Diagnosis not present

## 2013-04-18 ENCOUNTER — Ambulatory Visit: Payer: Self-pay | Admitting: Oncology

## 2013-04-18 DIAGNOSIS — Z79899 Other long term (current) drug therapy: Secondary | ICD-10-CM | POA: Diagnosis not present

## 2013-04-18 DIAGNOSIS — D638 Anemia in other chronic diseases classified elsewhere: Secondary | ICD-10-CM | POA: Diagnosis not present

## 2013-04-26 DIAGNOSIS — D638 Anemia in other chronic diseases classified elsewhere: Secondary | ICD-10-CM | POA: Diagnosis not present

## 2013-04-26 DIAGNOSIS — N3020 Other chronic cystitis without hematuria: Secondary | ICD-10-CM | POA: Diagnosis not present

## 2013-04-26 DIAGNOSIS — M5030 Other cervical disc degeneration, unspecified cervical region: Secondary | ICD-10-CM | POA: Diagnosis not present

## 2013-05-06 LAB — CANCER CENTER HEMOGLOBIN: HGB: 9.4 g/dL — ABNORMAL LOW (ref 12.0–16.0)

## 2013-05-17 DIAGNOSIS — D638 Anemia in other chronic diseases classified elsewhere: Secondary | ICD-10-CM | POA: Diagnosis not present

## 2013-05-17 DIAGNOSIS — M5030 Other cervical disc degeneration, unspecified cervical region: Secondary | ICD-10-CM | POA: Diagnosis not present

## 2013-05-17 DIAGNOSIS — I10 Essential (primary) hypertension: Secondary | ICD-10-CM | POA: Diagnosis not present

## 2013-05-17 DIAGNOSIS — G4700 Insomnia, unspecified: Secondary | ICD-10-CM | POA: Diagnosis not present

## 2013-05-24 ENCOUNTER — Ambulatory Visit: Payer: Self-pay | Admitting: Internal Medicine

## 2013-05-24 DIAGNOSIS — Z853 Personal history of malignant neoplasm of breast: Secondary | ICD-10-CM | POA: Diagnosis not present

## 2013-05-24 DIAGNOSIS — N63 Unspecified lump in unspecified breast: Secondary | ICD-10-CM | POA: Diagnosis not present

## 2013-06-17 ENCOUNTER — Ambulatory Visit: Payer: Self-pay | Admitting: Oncology

## 2013-06-17 DIAGNOSIS — D638 Anemia in other chronic diseases classified elsewhere: Secondary | ICD-10-CM | POA: Diagnosis not present

## 2013-06-17 LAB — CANCER CENTER HEMOGLOBIN: HGB: 9.4 g/dL — ABNORMAL LOW (ref 12.0–16.0)

## 2013-06-18 ENCOUNTER — Ambulatory Visit: Payer: Self-pay | Admitting: Oncology

## 2013-07-29 ENCOUNTER — Ambulatory Visit: Payer: Self-pay | Admitting: Oncology

## 2013-07-29 DIAGNOSIS — D638 Anemia in other chronic diseases classified elsewhere: Secondary | ICD-10-CM | POA: Diagnosis not present

## 2013-07-29 DIAGNOSIS — Z79899 Other long term (current) drug therapy: Secondary | ICD-10-CM | POA: Diagnosis not present

## 2013-07-29 LAB — CANCER CENTER HEMOGLOBIN: HGB: 9.2 g/dL — ABNORMAL LOW (ref 12.0–16.0)

## 2013-08-17 DIAGNOSIS — Z23 Encounter for immunization: Secondary | ICD-10-CM | POA: Diagnosis not present

## 2013-08-17 DIAGNOSIS — M5030 Other cervical disc degeneration, unspecified cervical region: Secondary | ICD-10-CM | POA: Diagnosis not present

## 2013-08-17 DIAGNOSIS — N3020 Other chronic cystitis without hematuria: Secondary | ICD-10-CM | POA: Diagnosis not present

## 2013-08-17 DIAGNOSIS — D638 Anemia in other chronic diseases classified elsewhere: Secondary | ICD-10-CM | POA: Diagnosis not present

## 2013-08-18 ENCOUNTER — Ambulatory Visit: Payer: Self-pay | Admitting: Oncology

## 2013-08-18 DIAGNOSIS — Z87891 Personal history of nicotine dependence: Secondary | ICD-10-CM | POA: Diagnosis not present

## 2013-08-18 DIAGNOSIS — Z853 Personal history of malignant neoplasm of breast: Secondary | ICD-10-CM | POA: Diagnosis not present

## 2013-08-18 DIAGNOSIS — Z90710 Acquired absence of both cervix and uterus: Secondary | ICD-10-CM | POA: Diagnosis not present

## 2013-08-18 DIAGNOSIS — Z79899 Other long term (current) drug therapy: Secondary | ICD-10-CM | POA: Diagnosis not present

## 2013-08-18 DIAGNOSIS — Z7982 Long term (current) use of aspirin: Secondary | ICD-10-CM | POA: Diagnosis not present

## 2013-08-18 DIAGNOSIS — D649 Anemia, unspecified: Secondary | ICD-10-CM | POA: Diagnosis not present

## 2013-08-18 DIAGNOSIS — Z23 Encounter for immunization: Secondary | ICD-10-CM | POA: Diagnosis not present

## 2013-08-18 DIAGNOSIS — Z0000 Encounter for general adult medical examination without abnormal findings: Secondary | ICD-10-CM | POA: Diagnosis not present

## 2013-08-18 DIAGNOSIS — I10 Essential (primary) hypertension: Secondary | ICD-10-CM | POA: Diagnosis not present

## 2013-08-24 DIAGNOSIS — M5030 Other cervical disc degeneration, unspecified cervical region: Secondary | ICD-10-CM | POA: Diagnosis not present

## 2013-08-24 DIAGNOSIS — N3020 Other chronic cystitis without hematuria: Secondary | ICD-10-CM | POA: Diagnosis not present

## 2013-09-09 DIAGNOSIS — Z853 Personal history of malignant neoplasm of breast: Secondary | ICD-10-CM | POA: Diagnosis not present

## 2013-09-09 DIAGNOSIS — Z79899 Other long term (current) drug therapy: Secondary | ICD-10-CM | POA: Diagnosis not present

## 2013-09-09 DIAGNOSIS — D649 Anemia, unspecified: Secondary | ICD-10-CM | POA: Diagnosis not present

## 2013-09-18 ENCOUNTER — Ambulatory Visit: Payer: Self-pay | Admitting: Oncology

## 2013-09-18 DIAGNOSIS — D638 Anemia in other chronic diseases classified elsewhere: Secondary | ICD-10-CM | POA: Diagnosis not present

## 2013-09-18 DIAGNOSIS — Z79899 Other long term (current) drug therapy: Secondary | ICD-10-CM | POA: Diagnosis not present

## 2013-10-07 LAB — CANCER CENTER HEMOGLOBIN: HGB: 9.2 g/dL — ABNORMAL LOW (ref 12.0–16.0)

## 2013-10-18 ENCOUNTER — Ambulatory Visit: Payer: Self-pay | Admitting: Oncology

## 2013-10-18 DIAGNOSIS — Z79899 Other long term (current) drug therapy: Secondary | ICD-10-CM | POA: Diagnosis not present

## 2013-10-18 DIAGNOSIS — D638 Anemia in other chronic diseases classified elsewhere: Secondary | ICD-10-CM | POA: Diagnosis not present

## 2013-11-04 LAB — CANCER CENTER HEMOGLOBIN: HGB: 9.2 g/dL — ABNORMAL LOW (ref 12.0–16.0)

## 2013-11-10 DIAGNOSIS — H2510 Age-related nuclear cataract, unspecified eye: Secondary | ICD-10-CM | POA: Diagnosis not present

## 2013-11-10 DIAGNOSIS — Z961 Presence of intraocular lens: Secondary | ICD-10-CM | POA: Diagnosis not present

## 2013-11-18 ENCOUNTER — Ambulatory Visit: Payer: Self-pay | Admitting: Oncology

## 2013-11-18 DIAGNOSIS — I10 Essential (primary) hypertension: Secondary | ICD-10-CM | POA: Diagnosis not present

## 2013-11-18 DIAGNOSIS — Z90710 Acquired absence of both cervix and uterus: Secondary | ICD-10-CM | POA: Diagnosis not present

## 2013-11-18 DIAGNOSIS — Z87891 Personal history of nicotine dependence: Secondary | ICD-10-CM | POA: Diagnosis not present

## 2013-11-18 DIAGNOSIS — D649 Anemia, unspecified: Secondary | ICD-10-CM | POA: Diagnosis not present

## 2013-11-18 DIAGNOSIS — Z853 Personal history of malignant neoplasm of breast: Secondary | ICD-10-CM | POA: Diagnosis not present

## 2013-11-30 DIAGNOSIS — D649 Anemia, unspecified: Secondary | ICD-10-CM | POA: Diagnosis not present

## 2013-11-30 DIAGNOSIS — N181 Chronic kidney disease, stage 1: Secondary | ICD-10-CM | POA: Diagnosis not present

## 2013-11-30 DIAGNOSIS — I129 Hypertensive chronic kidney disease with stage 1 through stage 4 chronic kidney disease, or unspecified chronic kidney disease: Secondary | ICD-10-CM | POA: Diagnosis not present

## 2013-11-30 DIAGNOSIS — F17200 Nicotine dependence, unspecified, uncomplicated: Secondary | ICD-10-CM | POA: Diagnosis not present

## 2013-12-02 DIAGNOSIS — Z853 Personal history of malignant neoplasm of breast: Secondary | ICD-10-CM | POA: Diagnosis not present

## 2013-12-02 DIAGNOSIS — D649 Anemia, unspecified: Secondary | ICD-10-CM | POA: Diagnosis not present

## 2013-12-02 LAB — CBC CANCER CENTER
BASOS ABS: 0 x10 3/mm (ref 0.0–0.1)
Basophil %: 0.3 %
Eosinophil #: 0 x10 3/mm (ref 0.0–0.7)
Eosinophil %: 0.1 %
HCT: 26.3 % — AB (ref 35.0–47.0)
HGB: 8.9 g/dL — AB (ref 12.0–16.0)
LYMPHS ABS: 1.3 x10 3/mm (ref 1.0–3.6)
LYMPHS PCT: 34.7 %
MCH: 31.4 pg (ref 26.0–34.0)
MCHC: 33.9 g/dL (ref 32.0–36.0)
MCV: 93 fL (ref 80–100)
MONOS PCT: 7.3 %
Monocyte #: 0.3 x10 3/mm (ref 0.2–0.9)
NEUTROS ABS: 2.1 x10 3/mm (ref 1.4–6.5)
NEUTROS PCT: 57.6 %
Platelet: 168 x10 3/mm (ref 150–440)
RBC: 2.84 10*6/uL — AB (ref 3.80–5.20)
RDW: 16.3 % — AB (ref 11.5–14.5)
WBC: 3.6 x10 3/mm (ref 3.6–11.0)

## 2013-12-19 ENCOUNTER — Ambulatory Visit: Payer: Self-pay | Admitting: Oncology

## 2013-12-19 ENCOUNTER — Ambulatory Visit: Payer: Self-pay

## 2013-12-19 DIAGNOSIS — D649 Anemia, unspecified: Secondary | ICD-10-CM | POA: Diagnosis not present

## 2013-12-22 DIAGNOSIS — L570 Actinic keratosis: Secondary | ICD-10-CM | POA: Diagnosis not present

## 2013-12-22 DIAGNOSIS — R209 Unspecified disturbances of skin sensation: Secondary | ICD-10-CM | POA: Diagnosis not present

## 2013-12-22 DIAGNOSIS — Z85828 Personal history of other malignant neoplasm of skin: Secondary | ICD-10-CM | POA: Diagnosis not present

## 2013-12-30 LAB — CANCER CENTER HEMOGLOBIN: HGB: 8.8 g/dL — ABNORMAL LOW (ref 12.0–16.0)

## 2014-01-16 ENCOUNTER — Ambulatory Visit: Payer: Self-pay | Admitting: Oncology

## 2014-01-16 DIAGNOSIS — D649 Anemia, unspecified: Secondary | ICD-10-CM | POA: Diagnosis not present

## 2014-01-27 LAB — CANCER CENTER HEMOGLOBIN: HGB: 8.6 g/dL — AB (ref 12.0–16.0)

## 2014-02-16 ENCOUNTER — Ambulatory Visit: Payer: Self-pay | Admitting: Oncology

## 2014-02-16 DIAGNOSIS — Z90710 Acquired absence of both cervix and uterus: Secondary | ICD-10-CM | POA: Diagnosis not present

## 2014-02-16 DIAGNOSIS — Z79899 Other long term (current) drug therapy: Secondary | ICD-10-CM | POA: Diagnosis not present

## 2014-02-16 DIAGNOSIS — Z853 Personal history of malignant neoplasm of breast: Secondary | ICD-10-CM | POA: Diagnosis not present

## 2014-02-16 DIAGNOSIS — Z7982 Long term (current) use of aspirin: Secondary | ICD-10-CM | POA: Diagnosis not present

## 2014-02-16 DIAGNOSIS — Z801 Family history of malignant neoplasm of trachea, bronchus and lung: Secondary | ICD-10-CM | POA: Diagnosis not present

## 2014-02-16 DIAGNOSIS — Z807 Family history of other malignant neoplasms of lymphoid, hematopoietic and related tissues: Secondary | ICD-10-CM | POA: Diagnosis not present

## 2014-02-16 DIAGNOSIS — Z832 Family history of diseases of the blood and blood-forming organs and certain disorders involving the immune mechanism: Secondary | ICD-10-CM | POA: Diagnosis not present

## 2014-02-16 DIAGNOSIS — D649 Anemia, unspecified: Secondary | ICD-10-CM | POA: Diagnosis not present

## 2014-02-16 DIAGNOSIS — I10 Essential (primary) hypertension: Secondary | ICD-10-CM | POA: Diagnosis not present

## 2014-02-16 DIAGNOSIS — K449 Diaphragmatic hernia without obstruction or gangrene: Secondary | ICD-10-CM | POA: Diagnosis not present

## 2014-02-16 DIAGNOSIS — Z8249 Family history of ischemic heart disease and other diseases of the circulatory system: Secondary | ICD-10-CM | POA: Diagnosis not present

## 2014-02-16 DIAGNOSIS — Z9010 Acquired absence of unspecified breast and nipple: Secondary | ICD-10-CM | POA: Diagnosis not present

## 2014-02-24 DIAGNOSIS — D649 Anemia, unspecified: Secondary | ICD-10-CM | POA: Diagnosis not present

## 2014-02-24 LAB — CBC CANCER CENTER
BASOS ABS: 0 x10 3/mm (ref 0.0–0.1)
Basophil %: 0.3 %
EOS ABS: 0 x10 3/mm (ref 0.0–0.7)
EOS PCT: 0.1 %
HCT: 25.8 % — ABNORMAL LOW (ref 35.0–47.0)
HGB: 8.4 g/dL — ABNORMAL LOW (ref 12.0–16.0)
LYMPHS ABS: 1.4 x10 3/mm (ref 1.0–3.6)
LYMPHS PCT: 51.1 %
MCH: 30.9 pg (ref 26.0–34.0)
MCHC: 32.7 g/dL (ref 32.0–36.0)
MCV: 95 fL (ref 80–100)
Monocyte #: 0.2 x10 3/mm (ref 0.2–0.9)
Monocyte %: 7.4 %
Neutrophil #: 1.1 x10 3/mm — ABNORMAL LOW (ref 1.4–6.5)
Neutrophil %: 41.1 %
Platelet: 151 x10 3/mm (ref 150–440)
RBC: 2.73 10*6/uL — ABNORMAL LOW (ref 3.80–5.20)
RDW: 16 % — ABNORMAL HIGH (ref 11.5–14.5)
WBC: 2.7 x10 3/mm — AB (ref 3.6–11.0)

## 2014-02-24 LAB — IRON AND TIBC
Iron Bind.Cap.(Total): 283 ug/dL (ref 250–450)
Iron Saturation: 37 %
Iron: 104 ug/dL (ref 50–170)
Unbound Iron-Bind.Cap.: 179 ug/dL

## 2014-02-24 LAB — FERRITIN: Ferritin (ARMC): 218 ng/mL (ref 8–388)

## 2014-03-14 DIAGNOSIS — IMO0001 Reserved for inherently not codable concepts without codable children: Secondary | ICD-10-CM | POA: Diagnosis not present

## 2014-03-14 DIAGNOSIS — M5030 Other cervical disc degeneration, unspecified cervical region: Secondary | ICD-10-CM | POA: Diagnosis not present

## 2014-03-14 DIAGNOSIS — D638 Anemia in other chronic diseases classified elsewhere: Secondary | ICD-10-CM | POA: Diagnosis not present

## 2014-03-14 DIAGNOSIS — N3020 Other chronic cystitis without hematuria: Secondary | ICD-10-CM | POA: Diagnosis not present

## 2014-03-17 LAB — CANCER CENTER HEMOGLOBIN: HGB: 8.6 g/dL — ABNORMAL LOW (ref 12.0–16.0)

## 2014-03-18 ENCOUNTER — Ambulatory Visit: Payer: Self-pay | Admitting: Oncology

## 2014-03-18 DIAGNOSIS — D649 Anemia, unspecified: Secondary | ICD-10-CM | POA: Diagnosis not present

## 2014-04-07 LAB — CANCER CENTER HEMOGLOBIN: HGB: 9 g/dL — ABNORMAL LOW (ref 12.0–16.0)

## 2014-04-12 DIAGNOSIS — IMO0001 Reserved for inherently not codable concepts without codable children: Secondary | ICD-10-CM | POA: Diagnosis not present

## 2014-04-12 DIAGNOSIS — N3020 Other chronic cystitis without hematuria: Secondary | ICD-10-CM | POA: Diagnosis not present

## 2014-04-12 DIAGNOSIS — I10 Essential (primary) hypertension: Secondary | ICD-10-CM | POA: Diagnosis not present

## 2014-04-12 DIAGNOSIS — R197 Diarrhea, unspecified: Secondary | ICD-10-CM | POA: Diagnosis not present

## 2014-04-18 ENCOUNTER — Ambulatory Visit: Payer: Self-pay | Admitting: Oncology

## 2014-04-18 DIAGNOSIS — D649 Anemia, unspecified: Secondary | ICD-10-CM | POA: Diagnosis not present

## 2014-04-28 LAB — CANCER CENTER HEMOGLOBIN: HGB: 8.7 g/dL — ABNORMAL LOW (ref 12.0–16.0)

## 2014-05-18 ENCOUNTER — Ambulatory Visit: Payer: Self-pay | Admitting: Oncology

## 2014-05-18 ENCOUNTER — Ambulatory Visit: Payer: Self-pay | Admitting: Hematology and Oncology

## 2014-05-18 DIAGNOSIS — D649 Anemia, unspecified: Secondary | ICD-10-CM | POA: Diagnosis not present

## 2014-05-18 DIAGNOSIS — Z90710 Acquired absence of both cervix and uterus: Secondary | ICD-10-CM | POA: Diagnosis not present

## 2014-05-18 DIAGNOSIS — F17200 Nicotine dependence, unspecified, uncomplicated: Secondary | ICD-10-CM | POA: Diagnosis not present

## 2014-05-18 DIAGNOSIS — Z832 Family history of diseases of the blood and blood-forming organs and certain disorders involving the immune mechanism: Secondary | ICD-10-CM | POA: Diagnosis not present

## 2014-05-18 DIAGNOSIS — Z87440 Personal history of urinary (tract) infections: Secondary | ICD-10-CM | POA: Diagnosis not present

## 2014-05-18 DIAGNOSIS — R059 Cough, unspecified: Secondary | ICD-10-CM | POA: Diagnosis not present

## 2014-05-18 DIAGNOSIS — Z79899 Other long term (current) drug therapy: Secondary | ICD-10-CM | POA: Diagnosis not present

## 2014-05-18 DIAGNOSIS — K449 Diaphragmatic hernia without obstruction or gangrene: Secondary | ICD-10-CM | POA: Diagnosis not present

## 2014-05-18 DIAGNOSIS — Z7982 Long term (current) use of aspirin: Secondary | ICD-10-CM | POA: Diagnosis not present

## 2014-05-18 DIAGNOSIS — J449 Chronic obstructive pulmonary disease, unspecified: Secondary | ICD-10-CM | POA: Diagnosis not present

## 2014-05-18 DIAGNOSIS — Z9010 Acquired absence of unspecified breast and nipple: Secondary | ICD-10-CM | POA: Diagnosis not present

## 2014-05-18 DIAGNOSIS — R634 Abnormal weight loss: Secondary | ICD-10-CM | POA: Diagnosis not present

## 2014-05-18 DIAGNOSIS — Z801 Family history of malignant neoplasm of trachea, bronchus and lung: Secondary | ICD-10-CM | POA: Diagnosis not present

## 2014-05-18 DIAGNOSIS — Z808 Family history of malignant neoplasm of other organs or systems: Secondary | ICD-10-CM | POA: Diagnosis not present

## 2014-05-18 DIAGNOSIS — Z8249 Family history of ischemic heart disease and other diseases of the circulatory system: Secondary | ICD-10-CM | POA: Diagnosis not present

## 2014-05-18 DIAGNOSIS — Z853 Personal history of malignant neoplasm of breast: Secondary | ICD-10-CM | POA: Diagnosis not present

## 2014-05-18 DIAGNOSIS — M549 Dorsalgia, unspecified: Secondary | ICD-10-CM | POA: Diagnosis not present

## 2014-05-18 DIAGNOSIS — I10 Essential (primary) hypertension: Secondary | ICD-10-CM | POA: Diagnosis not present

## 2014-05-18 DIAGNOSIS — R05 Cough: Secondary | ICD-10-CM | POA: Diagnosis not present

## 2014-05-18 DIAGNOSIS — R5381 Other malaise: Secondary | ICD-10-CM | POA: Diagnosis not present

## 2014-05-27 ENCOUNTER — Ambulatory Visit: Payer: Self-pay | Admitting: Internal Medicine

## 2014-05-27 DIAGNOSIS — R634 Abnormal weight loss: Secondary | ICD-10-CM | POA: Diagnosis not present

## 2014-05-27 DIAGNOSIS — M549 Dorsalgia, unspecified: Secondary | ICD-10-CM | POA: Diagnosis not present

## 2014-05-27 DIAGNOSIS — Z7982 Long term (current) use of aspirin: Secondary | ICD-10-CM | POA: Diagnosis not present

## 2014-05-27 DIAGNOSIS — R079 Chest pain, unspecified: Secondary | ICD-10-CM | POA: Diagnosis not present

## 2014-05-27 DIAGNOSIS — R05 Cough: Secondary | ICD-10-CM | POA: Diagnosis not present

## 2014-05-27 DIAGNOSIS — R059 Cough, unspecified: Secondary | ICD-10-CM | POA: Diagnosis not present

## 2014-05-27 DIAGNOSIS — M47817 Spondylosis without myelopathy or radiculopathy, lumbosacral region: Secondary | ICD-10-CM | POA: Diagnosis not present

## 2014-05-27 DIAGNOSIS — Z79899 Other long term (current) drug therapy: Secondary | ICD-10-CM | POA: Diagnosis not present

## 2014-05-27 DIAGNOSIS — D649 Anemia, unspecified: Secondary | ICD-10-CM | POA: Diagnosis not present

## 2014-05-27 DIAGNOSIS — J449 Chronic obstructive pulmonary disease, unspecified: Secondary | ICD-10-CM | POA: Diagnosis not present

## 2014-05-27 DIAGNOSIS — R627 Adult failure to thrive: Secondary | ICD-10-CM | POA: Diagnosis not present

## 2014-05-27 DIAGNOSIS — I10 Essential (primary) hypertension: Secondary | ICD-10-CM | POA: Diagnosis not present

## 2014-05-27 LAB — CBC CANCER CENTER
Basophil #: 0.2 x10 3/mm — ABNORMAL HIGH (ref 0.0–0.1)
Basophil %: 1.3 %
Eosinophil #: 0 x10 3/mm (ref 0.0–0.7)
Eosinophil %: 0.1 %
HCT: 25.7 % — ABNORMAL LOW (ref 35.0–47.0)
HGB: 8.6 g/dL — ABNORMAL LOW (ref 12.0–16.0)
Lymphocyte #: 0.7 x10 3/mm — ABNORMAL LOW (ref 1.0–3.6)
Lymphocyte %: 5.5 %
MCH: 31 pg (ref 26.0–34.0)
MCHC: 33.4 g/dL (ref 32.0–36.0)
MCV: 93 fL (ref 80–100)
Monocyte #: 0.4 x10 3/mm (ref 0.2–0.9)
Monocyte %: 3.1 %
NEUTROS PCT: 90 %
Neutrophil #: 11.4 x10 3/mm — ABNORMAL HIGH (ref 1.4–6.5)
PLATELETS: 133 x10 3/mm — AB (ref 150–440)
RBC: 2.78 10*6/uL — AB (ref 3.80–5.20)
RDW: 17.2 % — ABNORMAL HIGH (ref 11.5–14.5)
WBC: 12.7 x10 3/mm — AB (ref 3.6–11.0)

## 2014-05-30 DIAGNOSIS — I10 Essential (primary) hypertension: Secondary | ICD-10-CM | POA: Diagnosis not present

## 2014-05-30 DIAGNOSIS — S2239XA Fracture of one rib, unspecified side, initial encounter for closed fracture: Secondary | ICD-10-CM | POA: Diagnosis not present

## 2014-05-30 DIAGNOSIS — M5030 Other cervical disc degeneration, unspecified cervical region: Secondary | ICD-10-CM | POA: Diagnosis not present

## 2014-05-30 DIAGNOSIS — D638 Anemia in other chronic diseases classified elsewhere: Secondary | ICD-10-CM | POA: Diagnosis not present

## 2014-05-31 DIAGNOSIS — R634 Abnormal weight loss: Secondary | ICD-10-CM | POA: Diagnosis not present

## 2014-05-31 DIAGNOSIS — R059 Cough, unspecified: Secondary | ICD-10-CM | POA: Diagnosis not present

## 2014-05-31 DIAGNOSIS — Z79899 Other long term (current) drug therapy: Secondary | ICD-10-CM | POA: Diagnosis not present

## 2014-05-31 DIAGNOSIS — R05 Cough: Secondary | ICD-10-CM | POA: Diagnosis not present

## 2014-05-31 DIAGNOSIS — M549 Dorsalgia, unspecified: Secondary | ICD-10-CM | POA: Diagnosis not present

## 2014-05-31 DIAGNOSIS — D649 Anemia, unspecified: Secondary | ICD-10-CM | POA: Diagnosis not present

## 2014-05-31 DIAGNOSIS — J984 Other disorders of lung: Secondary | ICD-10-CM | POA: Diagnosis not present

## 2014-05-31 DIAGNOSIS — Z7982 Long term (current) use of aspirin: Secondary | ICD-10-CM | POA: Diagnosis not present

## 2014-06-02 DIAGNOSIS — I10 Essential (primary) hypertension: Secondary | ICD-10-CM | POA: Diagnosis not present

## 2014-06-02 DIAGNOSIS — J159 Unspecified bacterial pneumonia: Secondary | ICD-10-CM | POA: Diagnosis not present

## 2014-06-02 DIAGNOSIS — D638 Anemia in other chronic diseases classified elsewhere: Secondary | ICD-10-CM | POA: Diagnosis not present

## 2014-06-02 DIAGNOSIS — M5030 Other cervical disc degeneration, unspecified cervical region: Secondary | ICD-10-CM | POA: Diagnosis not present

## 2014-06-05 ENCOUNTER — Inpatient Hospital Stay: Payer: Self-pay | Admitting: Specialist

## 2014-06-05 DIAGNOSIS — D469 Myelodysplastic syndrome, unspecified: Secondary | ICD-10-CM | POA: Diagnosis not present

## 2014-06-05 DIAGNOSIS — Z880 Allergy status to penicillin: Secondary | ICD-10-CM | POA: Diagnosis not present

## 2014-06-05 DIAGNOSIS — E43 Unspecified severe protein-calorie malnutrition: Secondary | ICD-10-CM | POA: Diagnosis not present

## 2014-06-05 DIAGNOSIS — J189 Pneumonia, unspecified organism: Secondary | ICD-10-CM | POA: Diagnosis not present

## 2014-06-05 DIAGNOSIS — R0602 Shortness of breath: Secondary | ICD-10-CM | POA: Diagnosis not present

## 2014-06-05 DIAGNOSIS — Q068 Other specified congenital malformations of spinal cord: Secondary | ICD-10-CM | POA: Diagnosis not present

## 2014-06-05 DIAGNOSIS — E871 Hypo-osmolality and hyponatremia: Secondary | ICD-10-CM | POA: Diagnosis not present

## 2014-06-05 DIAGNOSIS — Z87891 Personal history of nicotine dependence: Secondary | ICD-10-CM | POA: Diagnosis not present

## 2014-06-05 DIAGNOSIS — IMO0002 Reserved for concepts with insufficient information to code with codable children: Secondary | ICD-10-CM | POA: Diagnosis not present

## 2014-06-05 DIAGNOSIS — R0902 Hypoxemia: Secondary | ICD-10-CM | POA: Diagnosis not present

## 2014-06-05 DIAGNOSIS — Z882 Allergy status to sulfonamides status: Secondary | ICD-10-CM | POA: Diagnosis not present

## 2014-06-05 DIAGNOSIS — I10 Essential (primary) hypertension: Secondary | ICD-10-CM | POA: Diagnosis not present

## 2014-06-05 DIAGNOSIS — D649 Anemia, unspecified: Secondary | ICD-10-CM | POA: Diagnosis not present

## 2014-06-05 DIAGNOSIS — G589 Mononeuropathy, unspecified: Secondary | ICD-10-CM | POA: Diagnosis present

## 2014-06-05 DIAGNOSIS — Z853 Personal history of malignant neoplasm of breast: Secondary | ICD-10-CM | POA: Diagnosis not present

## 2014-06-05 LAB — COMPREHENSIVE METABOLIC PANEL
ALBUMIN: 2.2 g/dL — AB (ref 3.4–5.0)
Alkaline Phosphatase: 68 U/L
Anion Gap: 8 (ref 7–16)
BUN: 17 mg/dL (ref 7–18)
Bilirubin,Total: 0.6 mg/dL (ref 0.2–1.0)
CALCIUM: 9.6 mg/dL (ref 8.5–10.1)
CHLORIDE: 93 mmol/L — AB (ref 98–107)
CO2: 27 mmol/L (ref 21–32)
Creatinine: 1.12 mg/dL (ref 0.60–1.30)
EGFR (African American): 52 — ABNORMAL LOW
GFR CALC NON AF AMER: 45 — AB
GLUCOSE: 94 mg/dL (ref 65–99)
Osmolality: 258 (ref 275–301)
Potassium: 4 mmol/L (ref 3.5–5.1)
SGOT(AST): 21 U/L (ref 15–37)
SGPT (ALT): 16 U/L (ref 12–78)
Sodium: 128 mmol/L — ABNORMAL LOW (ref 136–145)
TOTAL PROTEIN: 6.8 g/dL (ref 6.4–8.2)

## 2014-06-05 LAB — CBC
HCT: 21.2 % — AB (ref 35.0–47.0)
HGB: 7.3 g/dL — ABNORMAL LOW (ref 12.0–16.0)
MCH: 31.2 pg (ref 26.0–34.0)
MCHC: 34.5 g/dL (ref 32.0–36.0)
MCV: 90 fL (ref 80–100)
Platelet: 215 10*3/uL (ref 150–440)
RBC: 2.35 10*6/uL — ABNORMAL LOW (ref 3.80–5.20)
RDW: 17.5 % — AB (ref 11.5–14.5)
WBC: 4.5 10*3/uL (ref 3.6–11.0)

## 2014-06-05 LAB — URINALYSIS, COMPLETE
BACTERIA: NONE SEEN
BLOOD: NEGATIVE
Bilirubin,UR: NEGATIVE
Glucose,UR: NEGATIVE mg/dL (ref 0–75)
Ketone: NEGATIVE
LEUKOCYTE ESTERASE: NEGATIVE
NITRITE: NEGATIVE
PH: 6 (ref 4.5–8.0)
Protein: NEGATIVE
RBC,UR: 1 /HPF (ref 0–5)
Specific Gravity: 1.015 (ref 1.003–1.030)
WBC UR: 4 /HPF (ref 0–5)

## 2014-06-05 LAB — TROPONIN I

## 2014-06-05 LAB — CK TOTAL AND CKMB (NOT AT ARMC)
CK, TOTAL: 31 U/L
CK-MB: 0.8 ng/mL (ref 0.5–3.6)

## 2014-06-06 LAB — BASIC METABOLIC PANEL
ANION GAP: 7 (ref 7–16)
BUN: 15 mg/dL (ref 7–18)
Calcium, Total: 9 mg/dL (ref 8.5–10.1)
Chloride: 94 mmol/L — ABNORMAL LOW (ref 98–107)
Co2: 28 mmol/L (ref 21–32)
Creatinine: 1.04 mg/dL (ref 0.60–1.30)
EGFR (Non-African Amer.): 49 — ABNORMAL LOW
GFR CALC AF AMER: 57 — AB
GLUCOSE: 91 mg/dL (ref 65–99)
Osmolality: 259 (ref 275–301)
Potassium: 3.8 mmol/L (ref 3.5–5.1)
SODIUM: 129 mmol/L — AB (ref 136–145)

## 2014-06-06 LAB — CBC WITH DIFFERENTIAL/PLATELET
BASOS ABS: 0 10*3/uL (ref 0.0–0.1)
Basophil %: 0.2 %
EOS PCT: 0.1 %
Eosinophil #: 0 10*3/uL (ref 0.0–0.7)
HCT: 18.6 % — ABNORMAL LOW (ref 35.0–47.0)
HGB: 6.2 g/dL — ABNORMAL LOW (ref 12.0–16.0)
LYMPHS PCT: 35.5 %
Lymphocyte #: 1 10*3/uL (ref 1.0–3.6)
MCH: 30.8 pg (ref 26.0–34.0)
MCHC: 33.5 g/dL (ref 32.0–36.0)
MCV: 92 fL (ref 80–100)
MONOS PCT: 11 %
Monocyte #: 0.3 x10 3/mm (ref 0.2–0.9)
NEUTROS ABS: 1.5 10*3/uL (ref 1.4–6.5)
NEUTROS PCT: 53.2 %
PLATELETS: 175 10*3/uL (ref 150–440)
RBC: 2.02 10*6/uL — AB (ref 3.80–5.20)
RDW: 16.8 % — ABNORMAL HIGH (ref 11.5–14.5)
WBC: 2.8 10*3/uL — ABNORMAL LOW (ref 3.6–11.0)

## 2014-06-07 LAB — BASIC METABOLIC PANEL
ANION GAP: 7 (ref 7–16)
BUN: 12 mg/dL (ref 7–18)
CO2: 28 mmol/L (ref 21–32)
Calcium, Total: 9.2 mg/dL (ref 8.5–10.1)
Chloride: 96 mmol/L — ABNORMAL LOW (ref 98–107)
Creatinine: 0.97 mg/dL (ref 0.60–1.30)
EGFR (African American): >60
EGFR (Non-African Amer.): 53 — ABNORMAL LOW
Glucose: 90 mg/dL (ref 65–99)
Osmolality: 262 (ref 275–301)
Potassium: 3.9 mmol/L (ref 3.5–5.1)
Sodium: 131 mmol/L — ABNORMAL LOW (ref 136–145)

## 2014-06-07 LAB — CBC WITH DIFFERENTIAL/PLATELET
BASOS PCT: 0.7 %
Basophil #: 0 10*3/uL (ref 0.0–0.1)
EOS ABS: 0 10*3/uL (ref 0.0–0.7)
Eosinophil %: 0 %
HCT: 22.9 % — AB (ref 35.0–47.0)
HGB: 7.7 g/dL — AB (ref 12.0–16.0)
LYMPHS ABS: 1 10*3/uL (ref 1.0–3.6)
LYMPHS PCT: 42.3 %
MCH: 30.3 pg (ref 26.0–34.0)
MCHC: 33.8 g/dL (ref 32.0–36.0)
MCV: 90 fL (ref 80–100)
MONO ABS: 0.3 x10 3/mm (ref 0.2–0.9)
MONOS PCT: 10.9 %
NEUTROS ABS: 1.1 10*3/uL — AB (ref 1.4–6.5)
Neutrophil %: 45.9 %
Platelet: 163 10*3/uL (ref 150–440)
RBC: 2.55 10*6/uL — AB (ref 3.80–5.20)
RDW: 16.7 % — AB (ref 11.5–14.5)
WBC: 2.4 10*3/uL — ABNORMAL LOW (ref 3.6–11.0)

## 2014-06-07 LAB — OCCULT BLOOD X 1 CARD TO LAB, STOOL: OCCULT BLOOD, FECES: NEGATIVE

## 2014-06-08 LAB — CBC WITH DIFFERENTIAL/PLATELET
BASOS ABS: 0 10*3/uL (ref 0.0–0.1)
Basophil %: 0.3 %
Eosinophil #: 0 10*3/uL (ref 0.0–0.7)
Eosinophil %: 0 %
HCT: 23.3 % — ABNORMAL LOW (ref 35.0–47.0)
HGB: 8.1 g/dL — ABNORMAL LOW (ref 12.0–16.0)
LYMPHS ABS: 1.2 10*3/uL (ref 1.0–3.6)
Lymphocyte %: 36.3 %
MCH: 30.9 pg (ref 26.0–34.0)
MCHC: 34.8 g/dL (ref 32.0–36.0)
MCV: 89 fL (ref 80–100)
MONO ABS: 0.4 x10 3/mm (ref 0.2–0.9)
Monocyte %: 13.1 %
Neutrophil #: 1.6 10*3/uL (ref 1.4–6.5)
Neutrophil %: 50.3 %
Platelet: 151 10*3/uL (ref 150–440)
RBC: 2.63 10*6/uL — AB (ref 3.80–5.20)
RDW: 16.5 % — ABNORMAL HIGH (ref 11.5–14.5)
WBC: 3.2 10*3/uL — ABNORMAL LOW (ref 3.6–11.0)

## 2014-06-09 DIAGNOSIS — J189 Pneumonia, unspecified organism: Secondary | ICD-10-CM | POA: Diagnosis not present

## 2014-06-09 DIAGNOSIS — M6281 Muscle weakness (generalized): Secondary | ICD-10-CM | POA: Diagnosis not present

## 2014-06-09 DIAGNOSIS — R262 Difficulty in walking, not elsewhere classified: Secondary | ICD-10-CM | POA: Diagnosis not present

## 2014-06-09 DIAGNOSIS — I10 Essential (primary) hypertension: Secondary | ICD-10-CM | POA: Diagnosis not present

## 2014-06-09 DIAGNOSIS — D469 Myelodysplastic syndrome, unspecified: Secondary | ICD-10-CM | POA: Diagnosis not present

## 2014-06-10 DIAGNOSIS — R059 Cough, unspecified: Secondary | ICD-10-CM | POA: Diagnosis not present

## 2014-06-10 DIAGNOSIS — Z7982 Long term (current) use of aspirin: Secondary | ICD-10-CM | POA: Diagnosis not present

## 2014-06-10 DIAGNOSIS — D649 Anemia, unspecified: Secondary | ICD-10-CM | POA: Diagnosis not present

## 2014-06-10 DIAGNOSIS — M549 Dorsalgia, unspecified: Secondary | ICD-10-CM | POA: Diagnosis not present

## 2014-06-10 DIAGNOSIS — Z79899 Other long term (current) drug therapy: Secondary | ICD-10-CM | POA: Diagnosis not present

## 2014-06-10 DIAGNOSIS — R634 Abnormal weight loss: Secondary | ICD-10-CM | POA: Diagnosis not present

## 2014-06-10 DIAGNOSIS — R05 Cough: Secondary | ICD-10-CM | POA: Diagnosis not present

## 2014-06-10 LAB — CBC CANCER CENTER
BASOS ABS: 0 x10 3/mm (ref 0.0–0.1)
BASOS PCT: 0.3 %
Eosinophil #: 0 x10 3/mm (ref 0.0–0.7)
Eosinophil %: 0.1 %
HCT: 25.7 % — AB (ref 35.0–47.0)
HGB: 8.4 g/dL — AB (ref 12.0–16.0)
LYMPHS PCT: 15.3 %
Lymphocyte #: 0.9 x10 3/mm — ABNORMAL LOW (ref 1.0–3.6)
MCH: 29.7 pg (ref 26.0–34.0)
MCHC: 32.8 g/dL (ref 32.0–36.0)
MCV: 91 fL (ref 80–100)
MONO ABS: 0.4 x10 3/mm (ref 0.2–0.9)
MONOS PCT: 7.3 %
Neutrophil #: 4.7 x10 3/mm (ref 1.4–6.5)
Neutrophil %: 77 %
PLATELETS: 197 x10 3/mm (ref 150–440)
RBC: 2.83 10*6/uL — ABNORMAL LOW (ref 3.80–5.20)
RDW: 16.6 % — ABNORMAL HIGH (ref 11.5–14.5)
WBC: 6.1 x10 3/mm (ref 3.6–11.0)

## 2014-06-10 LAB — COMPREHENSIVE METABOLIC PANEL
ALBUMIN: 2.3 g/dL — AB (ref 3.4–5.0)
ANION GAP: 10 (ref 7–16)
Alkaline Phosphatase: 70 U/L
BUN: 22 mg/dL — AB (ref 7–18)
Bilirubin,Total: 0.4 mg/dL (ref 0.2–1.0)
CHLORIDE: 96 mmol/L — AB (ref 98–107)
CREATININE: 1.24 mg/dL (ref 0.60–1.30)
Calcium, Total: 10.3 mg/dL — ABNORMAL HIGH (ref 8.5–10.1)
Co2: 23 mmol/L (ref 21–32)
EGFR (African American): 46 — ABNORMAL LOW
GFR CALC NON AF AMER: 40 — AB
Glucose: 140 mg/dL — ABNORMAL HIGH (ref 65–99)
Osmolality: 265 (ref 275–301)
Potassium: 4.3 mmol/L (ref 3.5–5.1)
SGOT(AST): 11 U/L — ABNORMAL LOW (ref 15–37)
SGPT (ALT): 13 U/L — ABNORMAL LOW
Sodium: 129 mmol/L — ABNORMAL LOW (ref 136–145)
TOTAL PROTEIN: 7.1 g/dL (ref 6.4–8.2)

## 2014-06-10 LAB — IRON AND TIBC
Iron Bind.Cap.(Total): 208 ug/dL — ABNORMAL LOW (ref 250–450)
Iron Saturation: 13 %
Iron: 26 ug/dL — ABNORMAL LOW (ref 50–170)
UNBOUND IRON-BIND. CAP.: 182 ug/dL

## 2014-06-10 LAB — FERRITIN: Ferritin (ARMC): 347 ng/mL (ref 8–388)

## 2014-06-10 LAB — CULTURE, BLOOD (SINGLE)

## 2014-06-10 LAB — TSH: Thyroid Stimulating Horm: 0.571 u[IU]/mL

## 2014-06-13 ENCOUNTER — Ambulatory Visit: Payer: Self-pay | Admitting: Internal Medicine

## 2014-06-13 DIAGNOSIS — IMO0002 Reserved for concepts with insufficient information to code with codable children: Secondary | ICD-10-CM | POA: Diagnosis not present

## 2014-06-13 DIAGNOSIS — J189 Pneumonia, unspecified organism: Secondary | ICD-10-CM | POA: Diagnosis not present

## 2014-06-13 DIAGNOSIS — F341 Dysthymic disorder: Secondary | ICD-10-CM | POA: Diagnosis not present

## 2014-06-13 DIAGNOSIS — R918 Other nonspecific abnormal finding of lung field: Secondary | ICD-10-CM | POA: Diagnosis not present

## 2014-06-13 DIAGNOSIS — M1710 Unilateral primary osteoarthritis, unspecified knee: Secondary | ICD-10-CM | POA: Diagnosis not present

## 2014-06-13 DIAGNOSIS — F29 Unspecified psychosis not due to a substance or known physiological condition: Secondary | ICD-10-CM | POA: Diagnosis not present

## 2014-06-13 DIAGNOSIS — K589 Irritable bowel syndrome without diarrhea: Secondary | ICD-10-CM | POA: Diagnosis not present

## 2014-06-13 DIAGNOSIS — D649 Anemia, unspecified: Secondary | ICD-10-CM | POA: Diagnosis not present

## 2014-06-13 DIAGNOSIS — M5030 Other cervical disc degeneration, unspecified cervical region: Secondary | ICD-10-CM | POA: Diagnosis not present

## 2014-06-14 DIAGNOSIS — I10 Essential (primary) hypertension: Secondary | ICD-10-CM | POA: Diagnosis not present

## 2014-06-14 DIAGNOSIS — R262 Difficulty in walking, not elsewhere classified: Secondary | ICD-10-CM | POA: Diagnosis not present

## 2014-06-14 DIAGNOSIS — M6281 Muscle weakness (generalized): Secondary | ICD-10-CM | POA: Diagnosis not present

## 2014-06-14 DIAGNOSIS — J189 Pneumonia, unspecified organism: Secondary | ICD-10-CM | POA: Diagnosis not present

## 2014-06-14 DIAGNOSIS — D469 Myelodysplastic syndrome, unspecified: Secondary | ICD-10-CM | POA: Diagnosis not present

## 2014-06-15 DIAGNOSIS — N184 Chronic kidney disease, stage 4 (severe): Secondary | ICD-10-CM | POA: Diagnosis not present

## 2014-06-15 DIAGNOSIS — M6281 Muscle weakness (generalized): Secondary | ICD-10-CM | POA: Diagnosis not present

## 2014-06-15 DIAGNOSIS — M1710 Unilateral primary osteoarthritis, unspecified knee: Secondary | ICD-10-CM | POA: Diagnosis not present

## 2014-06-15 DIAGNOSIS — J159 Unspecified bacterial pneumonia: Secondary | ICD-10-CM | POA: Diagnosis not present

## 2014-06-15 DIAGNOSIS — M25569 Pain in unspecified knee: Secondary | ICD-10-CM | POA: Diagnosis not present

## 2014-06-15 DIAGNOSIS — R262 Difficulty in walking, not elsewhere classified: Secondary | ICD-10-CM | POA: Diagnosis not present

## 2014-06-15 DIAGNOSIS — J189 Pneumonia, unspecified organism: Secondary | ICD-10-CM | POA: Diagnosis not present

## 2014-06-15 DIAGNOSIS — D469 Myelodysplastic syndrome, unspecified: Secondary | ICD-10-CM | POA: Diagnosis not present

## 2014-06-15 DIAGNOSIS — I10 Essential (primary) hypertension: Secondary | ICD-10-CM | POA: Diagnosis not present

## 2014-06-17 DIAGNOSIS — I10 Essential (primary) hypertension: Secondary | ICD-10-CM | POA: Diagnosis not present

## 2014-06-17 DIAGNOSIS — M6281 Muscle weakness (generalized): Secondary | ICD-10-CM | POA: Diagnosis not present

## 2014-06-17 DIAGNOSIS — R262 Difficulty in walking, not elsewhere classified: Secondary | ICD-10-CM | POA: Diagnosis not present

## 2014-06-17 DIAGNOSIS — J189 Pneumonia, unspecified organism: Secondary | ICD-10-CM | POA: Diagnosis not present

## 2014-06-17 DIAGNOSIS — D469 Myelodysplastic syndrome, unspecified: Secondary | ICD-10-CM | POA: Diagnosis not present

## 2014-06-18 ENCOUNTER — Ambulatory Visit: Payer: Self-pay | Admitting: Hematology and Oncology

## 2014-06-20 ENCOUNTER — Ambulatory Visit: Payer: Self-pay | Admitting: Internal Medicine

## 2014-06-20 DIAGNOSIS — M6281 Muscle weakness (generalized): Secondary | ICD-10-CM | POA: Diagnosis not present

## 2014-06-20 DIAGNOSIS — I10 Essential (primary) hypertension: Secondary | ICD-10-CM | POA: Diagnosis not present

## 2014-06-20 DIAGNOSIS — J90 Pleural effusion, not elsewhere classified: Secondary | ICD-10-CM | POA: Diagnosis not present

## 2014-06-20 DIAGNOSIS — J189 Pneumonia, unspecified organism: Secondary | ICD-10-CM | POA: Diagnosis not present

## 2014-06-20 DIAGNOSIS — D469 Myelodysplastic syndrome, unspecified: Secondary | ICD-10-CM | POA: Diagnosis not present

## 2014-06-20 DIAGNOSIS — R262 Difficulty in walking, not elsewhere classified: Secondary | ICD-10-CM | POA: Diagnosis not present

## 2014-06-21 DIAGNOSIS — J159 Unspecified bacterial pneumonia: Secondary | ICD-10-CM | POA: Diagnosis not present

## 2014-06-21 DIAGNOSIS — R262 Difficulty in walking, not elsewhere classified: Secondary | ICD-10-CM | POA: Diagnosis not present

## 2014-06-21 DIAGNOSIS — I10 Essential (primary) hypertension: Secondary | ICD-10-CM | POA: Diagnosis not present

## 2014-06-21 DIAGNOSIS — F341 Dysthymic disorder: Secondary | ICD-10-CM | POA: Diagnosis not present

## 2014-06-21 DIAGNOSIS — N184 Chronic kidney disease, stage 4 (severe): Secondary | ICD-10-CM | POA: Diagnosis not present

## 2014-06-21 DIAGNOSIS — J189 Pneumonia, unspecified organism: Secondary | ICD-10-CM | POA: Diagnosis not present

## 2014-06-21 DIAGNOSIS — D469 Myelodysplastic syndrome, unspecified: Secondary | ICD-10-CM | POA: Diagnosis not present

## 2014-06-21 DIAGNOSIS — M6281 Muscle weakness (generalized): Secondary | ICD-10-CM | POA: Diagnosis not present

## 2014-06-22 ENCOUNTER — Ambulatory Visit: Payer: Self-pay | Admitting: Oncology

## 2014-06-22 DIAGNOSIS — Z808 Family history of malignant neoplasm of other organs or systems: Secondary | ICD-10-CM | POA: Diagnosis not present

## 2014-06-22 DIAGNOSIS — R059 Cough, unspecified: Secondary | ICD-10-CM | POA: Diagnosis not present

## 2014-06-22 DIAGNOSIS — Z87440 Personal history of urinary (tract) infections: Secondary | ICD-10-CM | POA: Diagnosis not present

## 2014-06-22 DIAGNOSIS — K449 Diaphragmatic hernia without obstruction or gangrene: Secondary | ICD-10-CM | POA: Diagnosis not present

## 2014-06-22 DIAGNOSIS — I10 Essential (primary) hypertension: Secondary | ICD-10-CM | POA: Diagnosis not present

## 2014-06-22 DIAGNOSIS — Z832 Family history of diseases of the blood and blood-forming organs and certain disorders involving the immune mechanism: Secondary | ICD-10-CM | POA: Diagnosis not present

## 2014-06-22 DIAGNOSIS — Z801 Family history of malignant neoplasm of trachea, bronchus and lung: Secondary | ICD-10-CM | POA: Diagnosis not present

## 2014-06-22 DIAGNOSIS — Z8249 Family history of ischemic heart disease and other diseases of the circulatory system: Secondary | ICD-10-CM | POA: Diagnosis not present

## 2014-06-22 DIAGNOSIS — Z79899 Other long term (current) drug therapy: Secondary | ICD-10-CM | POA: Diagnosis not present

## 2014-06-22 DIAGNOSIS — Z7982 Long term (current) use of aspirin: Secondary | ICD-10-CM | POA: Diagnosis not present

## 2014-06-22 DIAGNOSIS — Z853 Personal history of malignant neoplasm of breast: Secondary | ICD-10-CM | POA: Diagnosis not present

## 2014-06-22 DIAGNOSIS — D649 Anemia, unspecified: Secondary | ICD-10-CM | POA: Diagnosis not present

## 2014-06-22 DIAGNOSIS — R634 Abnormal weight loss: Secondary | ICD-10-CM | POA: Diagnosis not present

## 2014-06-22 DIAGNOSIS — Z90710 Acquired absence of both cervix and uterus: Secondary | ICD-10-CM | POA: Diagnosis not present

## 2014-06-22 DIAGNOSIS — Z9010 Acquired absence of unspecified breast and nipple: Secondary | ICD-10-CM | POA: Diagnosis not present

## 2014-06-22 DIAGNOSIS — J449 Chronic obstructive pulmonary disease, unspecified: Secondary | ICD-10-CM | POA: Diagnosis not present

## 2014-06-22 DIAGNOSIS — M549 Dorsalgia, unspecified: Secondary | ICD-10-CM | POA: Diagnosis not present

## 2014-06-22 DIAGNOSIS — F17200 Nicotine dependence, unspecified, uncomplicated: Secondary | ICD-10-CM | POA: Diagnosis not present

## 2014-06-22 DIAGNOSIS — R5381 Other malaise: Secondary | ICD-10-CM | POA: Diagnosis not present

## 2014-06-23 DIAGNOSIS — R262 Difficulty in walking, not elsewhere classified: Secondary | ICD-10-CM | POA: Diagnosis not present

## 2014-06-23 DIAGNOSIS — M6281 Muscle weakness (generalized): Secondary | ICD-10-CM | POA: Diagnosis not present

## 2014-06-23 DIAGNOSIS — D469 Myelodysplastic syndrome, unspecified: Secondary | ICD-10-CM | POA: Diagnosis not present

## 2014-06-23 DIAGNOSIS — I10 Essential (primary) hypertension: Secondary | ICD-10-CM | POA: Diagnosis not present

## 2014-06-23 DIAGNOSIS — J189 Pneumonia, unspecified organism: Secondary | ICD-10-CM | POA: Diagnosis not present

## 2014-06-24 ENCOUNTER — Ambulatory Visit: Payer: Self-pay | Admitting: Internal Medicine

## 2014-06-24 DIAGNOSIS — R0989 Other specified symptoms and signs involving the circulatory and respiratory systems: Secondary | ICD-10-CM | POA: Diagnosis not present

## 2014-06-24 DIAGNOSIS — R05 Cough: Secondary | ICD-10-CM | POA: Diagnosis not present

## 2014-06-24 DIAGNOSIS — R059 Cough, unspecified: Secondary | ICD-10-CM | POA: Diagnosis not present

## 2014-06-24 LAB — CANCER CENTER HEMOGLOBIN: HGB: 7.4 g/dL — AB (ref 12.0–16.0)

## 2014-06-27 DIAGNOSIS — D638 Anemia in other chronic diseases classified elsewhere: Secondary | ICD-10-CM | POA: Diagnosis not present

## 2014-06-27 DIAGNOSIS — J159 Unspecified bacterial pneumonia: Secondary | ICD-10-CM | POA: Diagnosis not present

## 2014-06-27 DIAGNOSIS — D469 Myelodysplastic syndrome, unspecified: Secondary | ICD-10-CM | POA: Diagnosis not present

## 2014-06-27 DIAGNOSIS — N184 Chronic kidney disease, stage 4 (severe): Secondary | ICD-10-CM | POA: Diagnosis not present

## 2014-06-28 DIAGNOSIS — R262 Difficulty in walking, not elsewhere classified: Secondary | ICD-10-CM | POA: Diagnosis not present

## 2014-06-28 DIAGNOSIS — J189 Pneumonia, unspecified organism: Secondary | ICD-10-CM | POA: Diagnosis not present

## 2014-06-28 DIAGNOSIS — D469 Myelodysplastic syndrome, unspecified: Secondary | ICD-10-CM | POA: Diagnosis not present

## 2014-06-28 DIAGNOSIS — I10 Essential (primary) hypertension: Secondary | ICD-10-CM | POA: Diagnosis not present

## 2014-06-28 DIAGNOSIS — M6281 Muscle weakness (generalized): Secondary | ICD-10-CM | POA: Diagnosis not present

## 2014-06-29 DIAGNOSIS — I10 Essential (primary) hypertension: Secondary | ICD-10-CM | POA: Diagnosis not present

## 2014-06-29 DIAGNOSIS — J189 Pneumonia, unspecified organism: Secondary | ICD-10-CM | POA: Diagnosis not present

## 2014-06-29 DIAGNOSIS — D469 Myelodysplastic syndrome, unspecified: Secondary | ICD-10-CM | POA: Diagnosis not present

## 2014-06-29 DIAGNOSIS — R262 Difficulty in walking, not elsewhere classified: Secondary | ICD-10-CM | POA: Diagnosis not present

## 2014-06-29 DIAGNOSIS — M6281 Muscle weakness (generalized): Secondary | ICD-10-CM | POA: Diagnosis not present

## 2014-07-02 DIAGNOSIS — M6281 Muscle weakness (generalized): Secondary | ICD-10-CM | POA: Diagnosis not present

## 2014-07-02 DIAGNOSIS — J189 Pneumonia, unspecified organism: Secondary | ICD-10-CM | POA: Diagnosis not present

## 2014-07-02 DIAGNOSIS — R262 Difficulty in walking, not elsewhere classified: Secondary | ICD-10-CM | POA: Diagnosis not present

## 2014-07-02 DIAGNOSIS — D469 Myelodysplastic syndrome, unspecified: Secondary | ICD-10-CM | POA: Diagnosis not present

## 2014-07-02 DIAGNOSIS — I10 Essential (primary) hypertension: Secondary | ICD-10-CM | POA: Diagnosis not present

## 2014-07-05 DIAGNOSIS — I10 Essential (primary) hypertension: Secondary | ICD-10-CM | POA: Diagnosis not present

## 2014-07-05 DIAGNOSIS — R262 Difficulty in walking, not elsewhere classified: Secondary | ICD-10-CM | POA: Diagnosis not present

## 2014-07-05 DIAGNOSIS — M6281 Muscle weakness (generalized): Secondary | ICD-10-CM | POA: Diagnosis not present

## 2014-07-05 DIAGNOSIS — J189 Pneumonia, unspecified organism: Secondary | ICD-10-CM | POA: Diagnosis not present

## 2014-07-05 DIAGNOSIS — D469 Myelodysplastic syndrome, unspecified: Secondary | ICD-10-CM | POA: Diagnosis not present

## 2014-07-07 DIAGNOSIS — R262 Difficulty in walking, not elsewhere classified: Secondary | ICD-10-CM | POA: Diagnosis not present

## 2014-07-07 DIAGNOSIS — D469 Myelodysplastic syndrome, unspecified: Secondary | ICD-10-CM | POA: Diagnosis not present

## 2014-07-07 DIAGNOSIS — J189 Pneumonia, unspecified organism: Secondary | ICD-10-CM | POA: Diagnosis not present

## 2014-07-07 DIAGNOSIS — I10 Essential (primary) hypertension: Secondary | ICD-10-CM | POA: Diagnosis not present

## 2014-07-07 DIAGNOSIS — M6281 Muscle weakness (generalized): Secondary | ICD-10-CM | POA: Diagnosis not present

## 2014-07-08 LAB — CANCER CENTER HEMOGLOBIN: HGB: 7.9 g/dL — ABNORMAL LOW (ref 12.0–16.0)

## 2014-07-11 DIAGNOSIS — J189 Pneumonia, unspecified organism: Secondary | ICD-10-CM | POA: Diagnosis not present

## 2014-07-11 DIAGNOSIS — D469 Myelodysplastic syndrome, unspecified: Secondary | ICD-10-CM | POA: Diagnosis not present

## 2014-07-11 DIAGNOSIS — J159 Unspecified bacterial pneumonia: Secondary | ICD-10-CM | POA: Diagnosis not present

## 2014-07-11 DIAGNOSIS — M5030 Other cervical disc degeneration, unspecified cervical region: Secondary | ICD-10-CM | POA: Diagnosis not present

## 2014-07-11 DIAGNOSIS — I10 Essential (primary) hypertension: Secondary | ICD-10-CM | POA: Diagnosis not present

## 2014-07-11 DIAGNOSIS — R262 Difficulty in walking, not elsewhere classified: Secondary | ICD-10-CM | POA: Diagnosis not present

## 2014-07-11 DIAGNOSIS — M6281 Muscle weakness (generalized): Secondary | ICD-10-CM | POA: Diagnosis not present

## 2014-07-13 DIAGNOSIS — J189 Pneumonia, unspecified organism: Secondary | ICD-10-CM | POA: Diagnosis not present

## 2014-07-13 DIAGNOSIS — I10 Essential (primary) hypertension: Secondary | ICD-10-CM | POA: Diagnosis not present

## 2014-07-13 DIAGNOSIS — R262 Difficulty in walking, not elsewhere classified: Secondary | ICD-10-CM | POA: Diagnosis not present

## 2014-07-13 DIAGNOSIS — M6281 Muscle weakness (generalized): Secondary | ICD-10-CM | POA: Diagnosis not present

## 2014-07-13 DIAGNOSIS — D469 Myelodysplastic syndrome, unspecified: Secondary | ICD-10-CM | POA: Diagnosis not present

## 2014-07-18 DIAGNOSIS — R262 Difficulty in walking, not elsewhere classified: Secondary | ICD-10-CM | POA: Diagnosis not present

## 2014-07-18 DIAGNOSIS — J189 Pneumonia, unspecified organism: Secondary | ICD-10-CM | POA: Diagnosis not present

## 2014-07-18 DIAGNOSIS — D469 Myelodysplastic syndrome, unspecified: Secondary | ICD-10-CM | POA: Diagnosis not present

## 2014-07-18 DIAGNOSIS — I10 Essential (primary) hypertension: Secondary | ICD-10-CM | POA: Diagnosis not present

## 2014-07-18 DIAGNOSIS — M6281 Muscle weakness (generalized): Secondary | ICD-10-CM | POA: Diagnosis not present

## 2014-07-19 ENCOUNTER — Ambulatory Visit: Payer: Self-pay | Admitting: Oncology

## 2014-07-19 DIAGNOSIS — Z79899 Other long term (current) drug therapy: Secondary | ICD-10-CM | POA: Diagnosis not present

## 2014-07-19 DIAGNOSIS — D649 Anemia, unspecified: Secondary | ICD-10-CM | POA: Diagnosis not present

## 2014-07-20 DIAGNOSIS — I10 Essential (primary) hypertension: Secondary | ICD-10-CM | POA: Diagnosis not present

## 2014-07-20 DIAGNOSIS — J189 Pneumonia, unspecified organism: Secondary | ICD-10-CM | POA: Diagnosis not present

## 2014-07-20 DIAGNOSIS — M6281 Muscle weakness (generalized): Secondary | ICD-10-CM | POA: Diagnosis not present

## 2014-07-20 DIAGNOSIS — D469 Myelodysplastic syndrome, unspecified: Secondary | ICD-10-CM | POA: Diagnosis not present

## 2014-07-20 DIAGNOSIS — R262 Difficulty in walking, not elsewhere classified: Secondary | ICD-10-CM | POA: Diagnosis not present

## 2014-07-22 LAB — CANCER CENTER HEMOGLOBIN: HGB: 8.3 g/dL — ABNORMAL LOW (ref 12.0–16.0)

## 2014-07-27 DIAGNOSIS — J189 Pneumonia, unspecified organism: Secondary | ICD-10-CM | POA: Diagnosis not present

## 2014-07-27 DIAGNOSIS — R262 Difficulty in walking, not elsewhere classified: Secondary | ICD-10-CM | POA: Diagnosis not present

## 2014-07-27 DIAGNOSIS — I10 Essential (primary) hypertension: Secondary | ICD-10-CM | POA: Diagnosis not present

## 2014-07-27 DIAGNOSIS — D469 Myelodysplastic syndrome, unspecified: Secondary | ICD-10-CM | POA: Diagnosis not present

## 2014-07-27 DIAGNOSIS — M6281 Muscle weakness (generalized): Secondary | ICD-10-CM | POA: Diagnosis not present

## 2014-08-03 ENCOUNTER — Ambulatory Visit: Payer: Self-pay | Admitting: Internal Medicine

## 2014-08-03 DIAGNOSIS — Z1231 Encounter for screening mammogram for malignant neoplasm of breast: Secondary | ICD-10-CM | POA: Diagnosis not present

## 2014-08-05 LAB — CANCER CENTER HEMOGLOBIN: HGB: 8.6 g/dL — ABNORMAL LOW (ref 12.0–16.0)

## 2014-08-18 ENCOUNTER — Ambulatory Visit: Payer: Self-pay | Admitting: Oncology

## 2014-08-18 DIAGNOSIS — Z79899 Other long term (current) drug therapy: Secondary | ICD-10-CM | POA: Diagnosis not present

## 2014-08-18 DIAGNOSIS — M25561 Pain in right knee: Secondary | ICD-10-CM | POA: Diagnosis not present

## 2014-08-18 DIAGNOSIS — Z87891 Personal history of nicotine dependence: Secondary | ICD-10-CM | POA: Diagnosis not present

## 2014-08-18 DIAGNOSIS — I10 Essential (primary) hypertension: Secondary | ICD-10-CM | POA: Diagnosis not present

## 2014-08-18 DIAGNOSIS — K449 Diaphragmatic hernia without obstruction or gangrene: Secondary | ICD-10-CM | POA: Diagnosis not present

## 2014-08-18 DIAGNOSIS — Z853 Personal history of malignant neoplasm of breast: Secondary | ICD-10-CM | POA: Diagnosis not present

## 2014-08-18 DIAGNOSIS — M25562 Pain in left knee: Secondary | ICD-10-CM | POA: Diagnosis not present

## 2014-08-18 DIAGNOSIS — Z801 Family history of malignant neoplasm of trachea, bronchus and lung: Secondary | ICD-10-CM | POA: Diagnosis not present

## 2014-08-18 DIAGNOSIS — D638 Anemia in other chronic diseases classified elsewhere: Secondary | ICD-10-CM | POA: Diagnosis not present

## 2014-08-18 DIAGNOSIS — Z9010 Acquired absence of unspecified breast and nipple: Secondary | ICD-10-CM | POA: Diagnosis not present

## 2014-08-18 DIAGNOSIS — Z8619 Personal history of other infectious and parasitic diseases: Secondary | ICD-10-CM | POA: Diagnosis not present

## 2014-08-18 DIAGNOSIS — Z7982 Long term (current) use of aspirin: Secondary | ICD-10-CM | POA: Diagnosis not present

## 2014-08-19 DIAGNOSIS — D638 Anemia in other chronic diseases classified elsewhere: Secondary | ICD-10-CM | POA: Diagnosis not present

## 2014-08-19 DIAGNOSIS — Z79899 Other long term (current) drug therapy: Secondary | ICD-10-CM | POA: Diagnosis not present

## 2014-08-19 DIAGNOSIS — I10 Essential (primary) hypertension: Secondary | ICD-10-CM | POA: Diagnosis not present

## 2014-08-19 LAB — CBC CANCER CENTER
BASOS ABS: 0 x10 3/mm (ref 0.0–0.1)
BASOS PCT: 0.4 %
Eosinophil #: 0 x10 3/mm (ref 0.0–0.7)
Eosinophil %: 0 %
HCT: 28.5 % — ABNORMAL LOW (ref 35.0–47.0)
HGB: 9.2 g/dL — AB (ref 12.0–16.0)
LYMPHS PCT: 68.4 %
Lymphocyte #: 1.6 x10 3/mm (ref 1.0–3.6)
MCH: 31.5 pg (ref 26.0–34.0)
MCHC: 32.3 g/dL (ref 32.0–36.0)
MCV: 98 fL (ref 80–100)
MONO ABS: 0.1 x10 3/mm — AB (ref 0.2–0.9)
Monocyte %: 6.4 %
NEUTROS ABS: 0.6 x10 3/mm — AB (ref 1.4–6.5)
Neutrophil %: 24.8 %
PLATELETS: 147 x10 3/mm — AB (ref 150–440)
RBC: 2.92 10*6/uL — ABNORMAL LOW (ref 3.80–5.20)
RDW: 19.8 % — ABNORMAL HIGH (ref 11.5–14.5)
WBC: 2.3 x10 3/mm — AB (ref 3.6–11.0)

## 2014-09-13 LAB — CANCER CENTER HEMOGLOBIN: HGB: 9.4 g/dL — ABNORMAL LOW (ref 12.0–16.0)

## 2014-09-18 ENCOUNTER — Ambulatory Visit: Payer: Self-pay | Admitting: Oncology

## 2014-09-18 DIAGNOSIS — Z853 Personal history of malignant neoplasm of breast: Secondary | ICD-10-CM | POA: Diagnosis not present

## 2014-09-18 DIAGNOSIS — D638 Anemia in other chronic diseases classified elsewhere: Secondary | ICD-10-CM | POA: Diagnosis not present

## 2014-09-18 DIAGNOSIS — I10 Essential (primary) hypertension: Secondary | ICD-10-CM | POA: Diagnosis not present

## 2014-09-18 DIAGNOSIS — Z79899 Other long term (current) drug therapy: Secondary | ICD-10-CM | POA: Diagnosis not present

## 2014-09-30 LAB — CANCER CENTER HEMOGLOBIN: HGB: 9.1 g/dL — AB (ref 12.0–16.0)

## 2014-10-18 ENCOUNTER — Ambulatory Visit: Payer: Self-pay | Admitting: Oncology

## 2014-10-18 DIAGNOSIS — I10 Essential (primary) hypertension: Secondary | ICD-10-CM | POA: Diagnosis not present

## 2014-10-18 DIAGNOSIS — D638 Anemia in other chronic diseases classified elsewhere: Secondary | ICD-10-CM | POA: Diagnosis not present

## 2014-10-21 LAB — CANCER CENTER HEMOGLOBIN: HGB: 9.7 g/dL — AB (ref 12.0–16.0)

## 2014-10-27 DIAGNOSIS — H918X9 Other specified hearing loss, unspecified ear: Secondary | ICD-10-CM | POA: Diagnosis not present

## 2014-10-27 DIAGNOSIS — H6120 Impacted cerumen, unspecified ear: Secondary | ICD-10-CM | POA: Diagnosis not present

## 2014-10-27 DIAGNOSIS — F17200 Nicotine dependence, unspecified, uncomplicated: Secondary | ICD-10-CM | POA: Diagnosis not present

## 2014-10-27 DIAGNOSIS — Z1389 Encounter for screening for other disorder: Secondary | ICD-10-CM | POA: Diagnosis not present

## 2014-10-27 DIAGNOSIS — Z720 Tobacco use: Secondary | ICD-10-CM | POA: Diagnosis not present

## 2014-10-28 DIAGNOSIS — Z23 Encounter for immunization: Secondary | ICD-10-CM | POA: Diagnosis not present

## 2014-11-03 DIAGNOSIS — H6120 Impacted cerumen, unspecified ear: Secondary | ICD-10-CM | POA: Diagnosis not present

## 2014-11-03 DIAGNOSIS — M791 Myalgia: Secondary | ICD-10-CM | POA: Diagnosis not present

## 2014-11-03 DIAGNOSIS — H938X9 Other specified disorders of ear, unspecified ear: Secondary | ICD-10-CM | POA: Diagnosis not present

## 2014-11-03 DIAGNOSIS — M609 Myositis, unspecified: Secondary | ICD-10-CM | POA: Diagnosis not present

## 2014-11-10 LAB — CANCER CENTER HEMOGLOBIN: HGB: 9.5 g/dL — AB (ref 12.0–16.0)

## 2014-11-18 ENCOUNTER — Ambulatory Visit: Payer: Self-pay | Admitting: Oncology

## 2014-11-18 DIAGNOSIS — Z79899 Other long term (current) drug therapy: Secondary | ICD-10-CM | POA: Diagnosis not present

## 2014-11-18 DIAGNOSIS — Z87891 Personal history of nicotine dependence: Secondary | ICD-10-CM | POA: Diagnosis not present

## 2014-11-18 DIAGNOSIS — Z8619 Personal history of other infectious and parasitic diseases: Secondary | ICD-10-CM | POA: Diagnosis not present

## 2014-11-18 DIAGNOSIS — Z7982 Long term (current) use of aspirin: Secondary | ICD-10-CM | POA: Diagnosis not present

## 2014-11-18 DIAGNOSIS — D638 Anemia in other chronic diseases classified elsewhere: Secondary | ICD-10-CM | POA: Diagnosis not present

## 2014-11-18 DIAGNOSIS — Z9010 Acquired absence of unspecified breast and nipple: Secondary | ICD-10-CM | POA: Diagnosis not present

## 2014-11-18 DIAGNOSIS — Z853 Personal history of malignant neoplasm of breast: Secondary | ICD-10-CM | POA: Diagnosis not present

## 2014-11-18 DIAGNOSIS — K449 Diaphragmatic hernia without obstruction or gangrene: Secondary | ICD-10-CM | POA: Diagnosis not present

## 2014-11-18 DIAGNOSIS — Z801 Family history of malignant neoplasm of trachea, bronchus and lung: Secondary | ICD-10-CM | POA: Diagnosis not present

## 2014-11-18 DIAGNOSIS — I10 Essential (primary) hypertension: Secondary | ICD-10-CM | POA: Diagnosis not present

## 2014-11-18 DIAGNOSIS — Z90710 Acquired absence of both cervix and uterus: Secondary | ICD-10-CM | POA: Diagnosis not present

## 2014-12-01 DIAGNOSIS — Z79899 Other long term (current) drug therapy: Secondary | ICD-10-CM | POA: Diagnosis not present

## 2014-12-01 DIAGNOSIS — Z8619 Personal history of other infectious and parasitic diseases: Secondary | ICD-10-CM | POA: Diagnosis not present

## 2014-12-01 DIAGNOSIS — K449 Diaphragmatic hernia without obstruction or gangrene: Secondary | ICD-10-CM | POA: Diagnosis not present

## 2014-12-01 DIAGNOSIS — Z7982 Long term (current) use of aspirin: Secondary | ICD-10-CM | POA: Diagnosis not present

## 2014-12-01 DIAGNOSIS — I10 Essential (primary) hypertension: Secondary | ICD-10-CM | POA: Diagnosis not present

## 2014-12-01 DIAGNOSIS — D638 Anemia in other chronic diseases classified elsewhere: Secondary | ICD-10-CM | POA: Diagnosis not present

## 2014-12-01 LAB — CBC CANCER CENTER
Basophil #: 0 x10 3/mm (ref 0.0–0.1)
Basophil %: 0.4 %
Eosinophil #: 0 x10 3/mm (ref 0.0–0.7)
Eosinophil %: 0.1 %
HCT: 29.1 % — ABNORMAL LOW (ref 35.0–47.0)
HGB: 9.7 g/dL — ABNORMAL LOW (ref 12.0–16.0)
Lymphocyte #: 1.9 x10 3/mm (ref 1.0–3.6)
Lymphocyte %: 57.8 %
MCH: 30.9 pg (ref 26.0–34.0)
MCHC: 33.5 g/dL (ref 32.0–36.0)
MCV: 92 fL (ref 80–100)
Monocyte #: 0.2 x10 3/mm (ref 0.2–0.9)
Monocyte %: 4.7 %
Neutrophil #: 1.2 x10 3/mm — ABNORMAL LOW (ref 1.4–6.5)
Neutrophil %: 37 %
Platelet: 171 x10 3/mm (ref 150–440)
RBC: 3.15 10*6/uL — ABNORMAL LOW (ref 3.80–5.20)
RDW: 18.6 % — ABNORMAL HIGH (ref 11.5–14.5)
WBC: 3.2 x10 3/mm — ABNORMAL LOW (ref 3.6–11.0)

## 2014-12-19 ENCOUNTER — Ambulatory Visit: Payer: Self-pay | Admitting: Oncology

## 2014-12-22 DIAGNOSIS — L821 Other seborrheic keratosis: Secondary | ICD-10-CM | POA: Diagnosis not present

## 2014-12-22 DIAGNOSIS — L570 Actinic keratosis: Secondary | ICD-10-CM | POA: Diagnosis not present

## 2014-12-22 DIAGNOSIS — Z85828 Personal history of other malignant neoplasm of skin: Secondary | ICD-10-CM | POA: Diagnosis not present

## 2015-01-18 DIAGNOSIS — Z961 Presence of intraocular lens: Secondary | ICD-10-CM | POA: Diagnosis not present

## 2015-02-02 DIAGNOSIS — D469 Myelodysplastic syndrome, unspecified: Secondary | ICD-10-CM | POA: Diagnosis not present

## 2015-02-02 DIAGNOSIS — I10 Essential (primary) hypertension: Secondary | ICD-10-CM | POA: Diagnosis not present

## 2015-02-02 DIAGNOSIS — R5381 Other malaise: Secondary | ICD-10-CM | POA: Diagnosis not present

## 2015-02-02 DIAGNOSIS — E784 Other hyperlipidemia: Secondary | ICD-10-CM | POA: Diagnosis not present

## 2015-02-21 DIAGNOSIS — D469 Myelodysplastic syndrome, unspecified: Secondary | ICD-10-CM | POA: Diagnosis not present

## 2015-02-21 DIAGNOSIS — D638 Anemia in other chronic diseases classified elsewhere: Secondary | ICD-10-CM | POA: Diagnosis not present

## 2015-02-21 DIAGNOSIS — M5093 Cervical disc disorder, unspecified, cervicothoracic region: Secondary | ICD-10-CM | POA: Diagnosis not present

## 2015-02-21 DIAGNOSIS — N184 Chronic kidney disease, stage 4 (severe): Secondary | ICD-10-CM | POA: Diagnosis not present

## 2015-03-01 ENCOUNTER — Ambulatory Visit
Admit: 2015-03-01 | Disposition: A | Discharge status: Home / Self Care | Payer: Self-pay | Attending: Oncology | Admitting: Oncology

## 2015-03-01 DIAGNOSIS — Z9010 Acquired absence of unspecified breast and nipple: Secondary | ICD-10-CM | POA: Diagnosis not present

## 2015-03-01 DIAGNOSIS — Z853 Personal history of malignant neoplasm of breast: Secondary | ICD-10-CM | POA: Diagnosis not present

## 2015-03-01 DIAGNOSIS — K449 Diaphragmatic hernia without obstruction or gangrene: Secondary | ICD-10-CM | POA: Diagnosis not present

## 2015-03-01 DIAGNOSIS — D638 Anemia in other chronic diseases classified elsewhere: Secondary | ICD-10-CM | POA: Diagnosis not present

## 2015-03-01 DIAGNOSIS — Z79899 Other long term (current) drug therapy: Secondary | ICD-10-CM | POA: Diagnosis not present

## 2015-03-01 DIAGNOSIS — R531 Weakness: Secondary | ICD-10-CM | POA: Diagnosis not present

## 2015-03-01 DIAGNOSIS — I10 Essential (primary) hypertension: Secondary | ICD-10-CM | POA: Diagnosis not present

## 2015-03-01 DIAGNOSIS — Z7982 Long term (current) use of aspirin: Secondary | ICD-10-CM | POA: Diagnosis not present

## 2015-03-01 DIAGNOSIS — Z90710 Acquired absence of both cervix and uterus: Secondary | ICD-10-CM | POA: Diagnosis not present

## 2015-03-01 DIAGNOSIS — Z801 Family history of malignant neoplasm of trachea, bronchus and lung: Secondary | ICD-10-CM | POA: Diagnosis not present

## 2015-03-01 DIAGNOSIS — Z8619 Personal history of other infectious and parasitic diseases: Secondary | ICD-10-CM | POA: Diagnosis not present

## 2015-03-01 DIAGNOSIS — Z87891 Personal history of nicotine dependence: Secondary | ICD-10-CM | POA: Diagnosis not present

## 2015-03-01 DIAGNOSIS — R5383 Other fatigue: Secondary | ICD-10-CM | POA: Diagnosis not present

## 2015-03-01 LAB — CBC CANCER CENTER
Basophil #: 0 x10 3/mm (ref 0.0–0.1)
Basophil %: 0.4 %
Eosinophil #: 0 x10 3/mm (ref 0.0–0.7)
Eosinophil %: 0.2 %
HCT: 21.6 % — ABNORMAL LOW (ref 35.0–47.0)
HGB: 7.3 g/dL — ABNORMAL LOW (ref 12.0–16.0)
LYMPHS ABS: 0.9 x10 3/mm — AB (ref 1.0–3.6)
Lymphocyte %: 47 %
MCH: 32.4 pg (ref 26.0–34.0)
MCHC: 33.9 g/dL (ref 32.0–36.0)
MCV: 96 fL (ref 80–100)
Monocyte #: 0.1 x10 3/mm — ABNORMAL LOW (ref 0.2–0.9)
Monocyte %: 5.4 %
Neutrophil #: 0.9 x10 3/mm — ABNORMAL LOW (ref 1.4–6.5)
Neutrophil %: 47 %
Platelet: 166 x10 3/mm (ref 150–440)
RBC: 2.27 10*6/uL — AB (ref 3.80–5.20)
RDW: 18.8 % — AB (ref 11.5–14.5)
WBC: 1.9 x10 3/mm — CL (ref 3.6–11.0)

## 2015-03-01 LAB — IRON AND TIBC
IRON BIND. CAP.(TOTAL): 279 (ref 250–450)
Iron Saturation: 29
Iron: 81 ug/dL
Unbound Iron-Bind.Cap.: 198

## 2015-03-01 LAB — FERRITIN: Ferritin (ARMC): 255 ng/mL

## 2015-03-07 DIAGNOSIS — I10 Essential (primary) hypertension: Secondary | ICD-10-CM | POA: Diagnosis not present

## 2015-03-07 DIAGNOSIS — Z79899 Other long term (current) drug therapy: Secondary | ICD-10-CM | POA: Diagnosis not present

## 2015-03-07 DIAGNOSIS — Z7982 Long term (current) use of aspirin: Secondary | ICD-10-CM | POA: Diagnosis not present

## 2015-03-07 DIAGNOSIS — R5383 Other fatigue: Secondary | ICD-10-CM | POA: Diagnosis not present

## 2015-03-07 DIAGNOSIS — R531 Weakness: Secondary | ICD-10-CM | POA: Diagnosis not present

## 2015-03-07 DIAGNOSIS — D638 Anemia in other chronic diseases classified elsewhere: Secondary | ICD-10-CM | POA: Diagnosis not present

## 2015-03-07 IMAGING — CR DG KNEE COMPLETE 4+V*L*
1 series · 4 of 4 positions shown · non-contrast
Comparison: None.

CLINICAL DATA: Left knee pain

EXAM:
LEFT KNEE - COMPLETE 4+ VIEW

[Series 1: t knee ap left · 0.14mm/px · 4 of 4 slices shown]
[im 1/4]
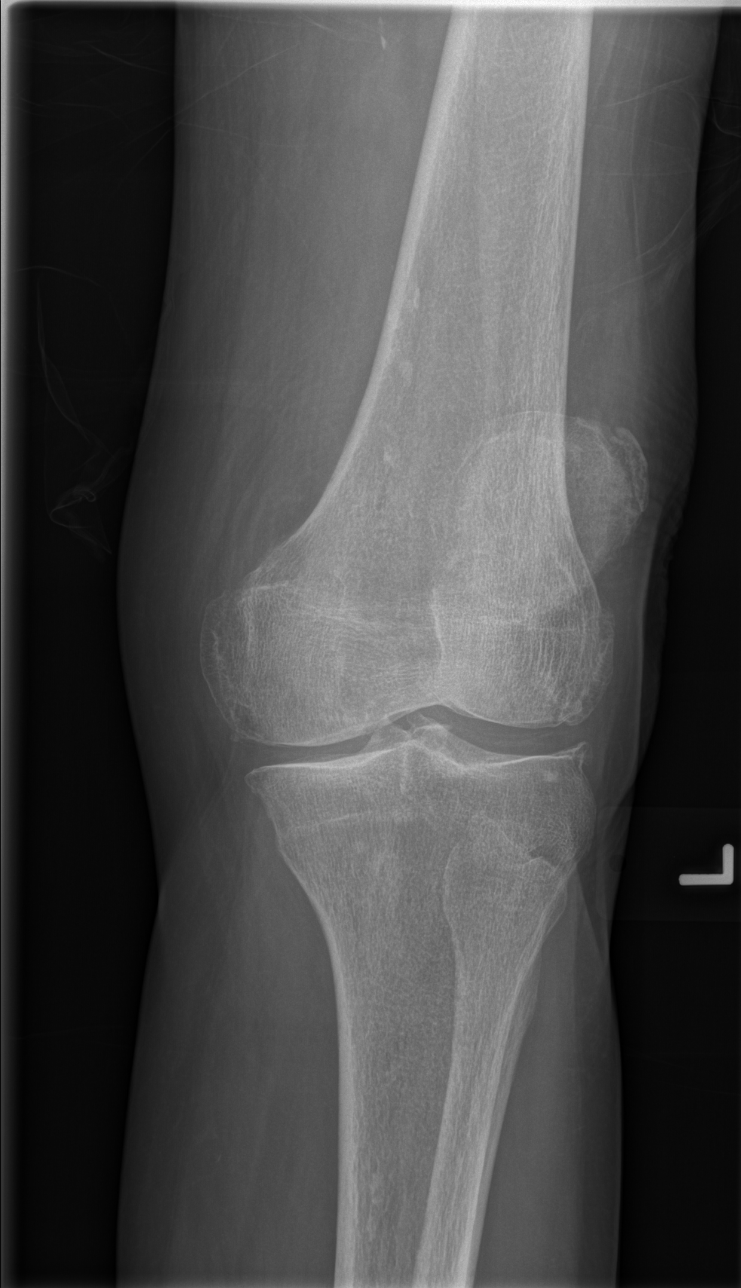
[im 2/4]
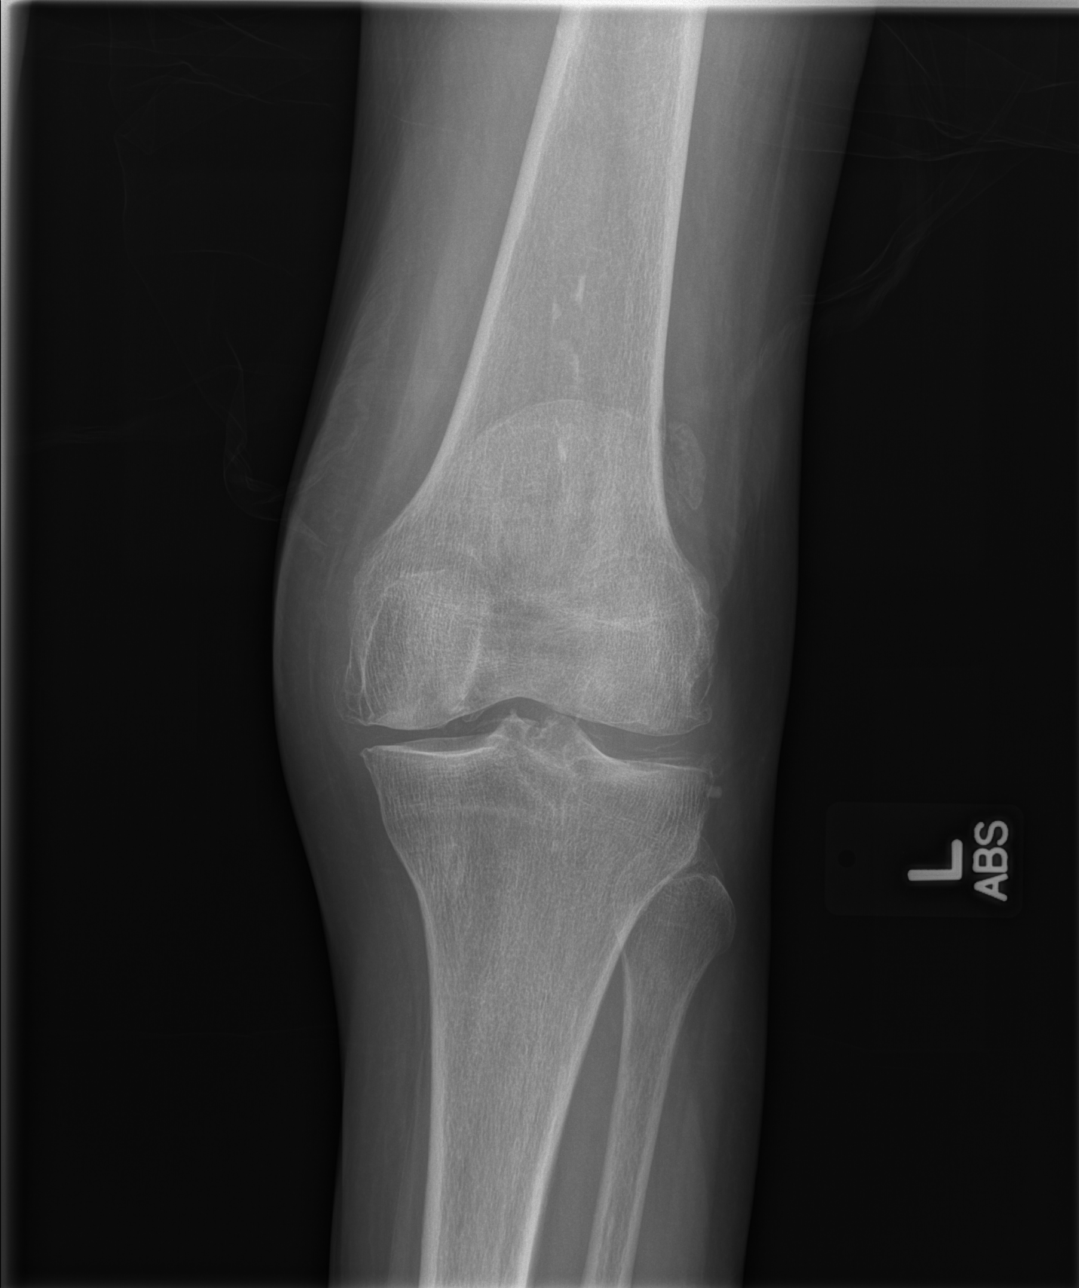
[im 3/4]
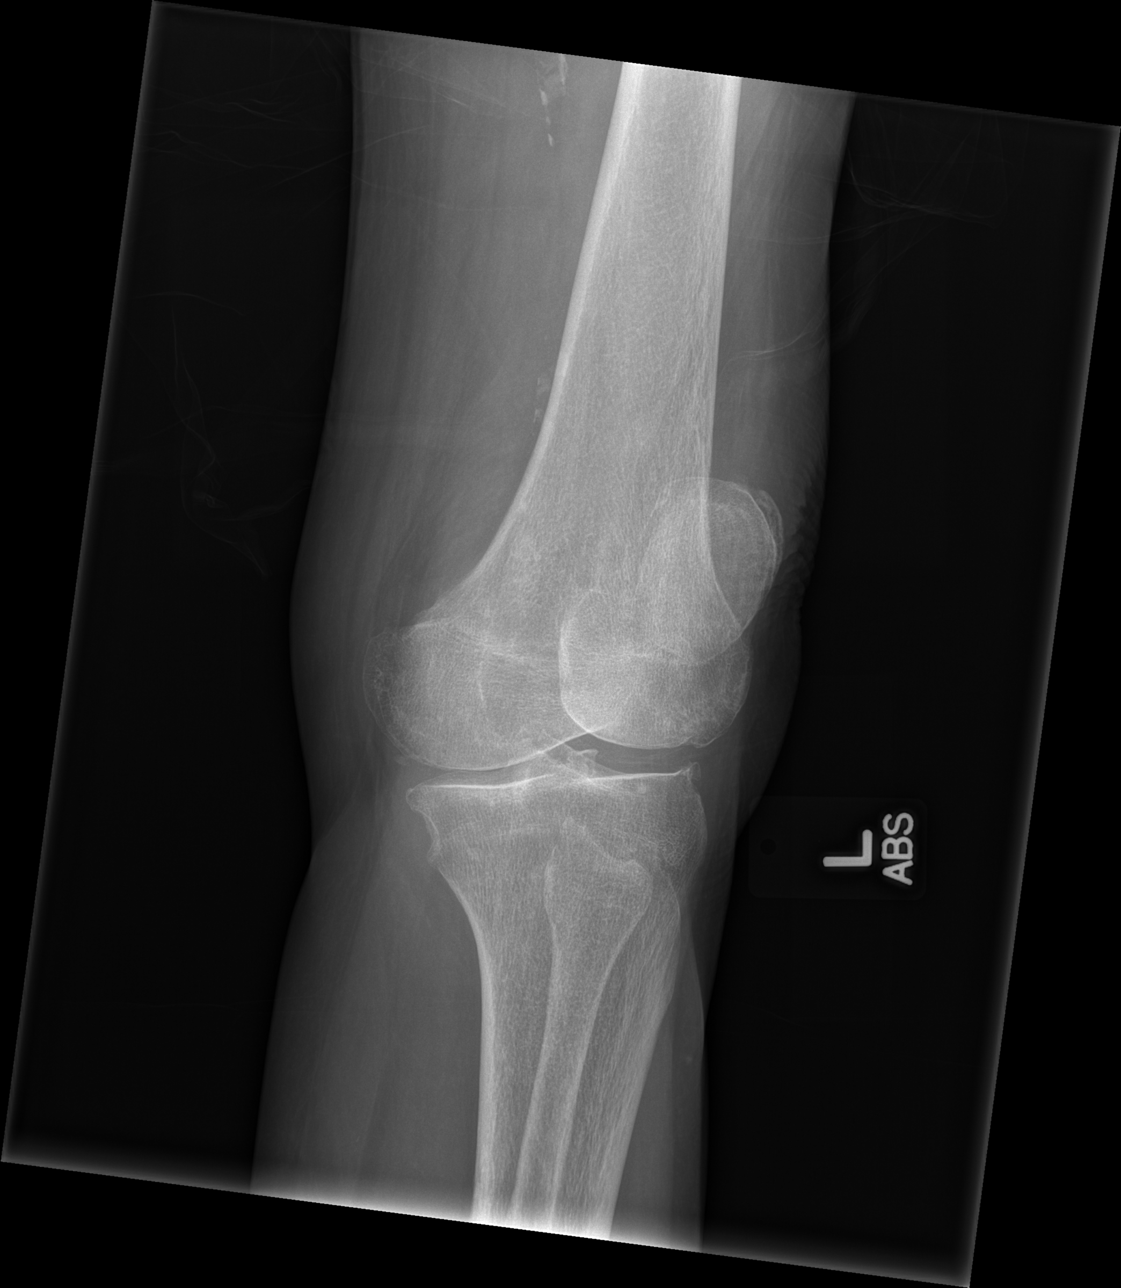
[im 4/4]
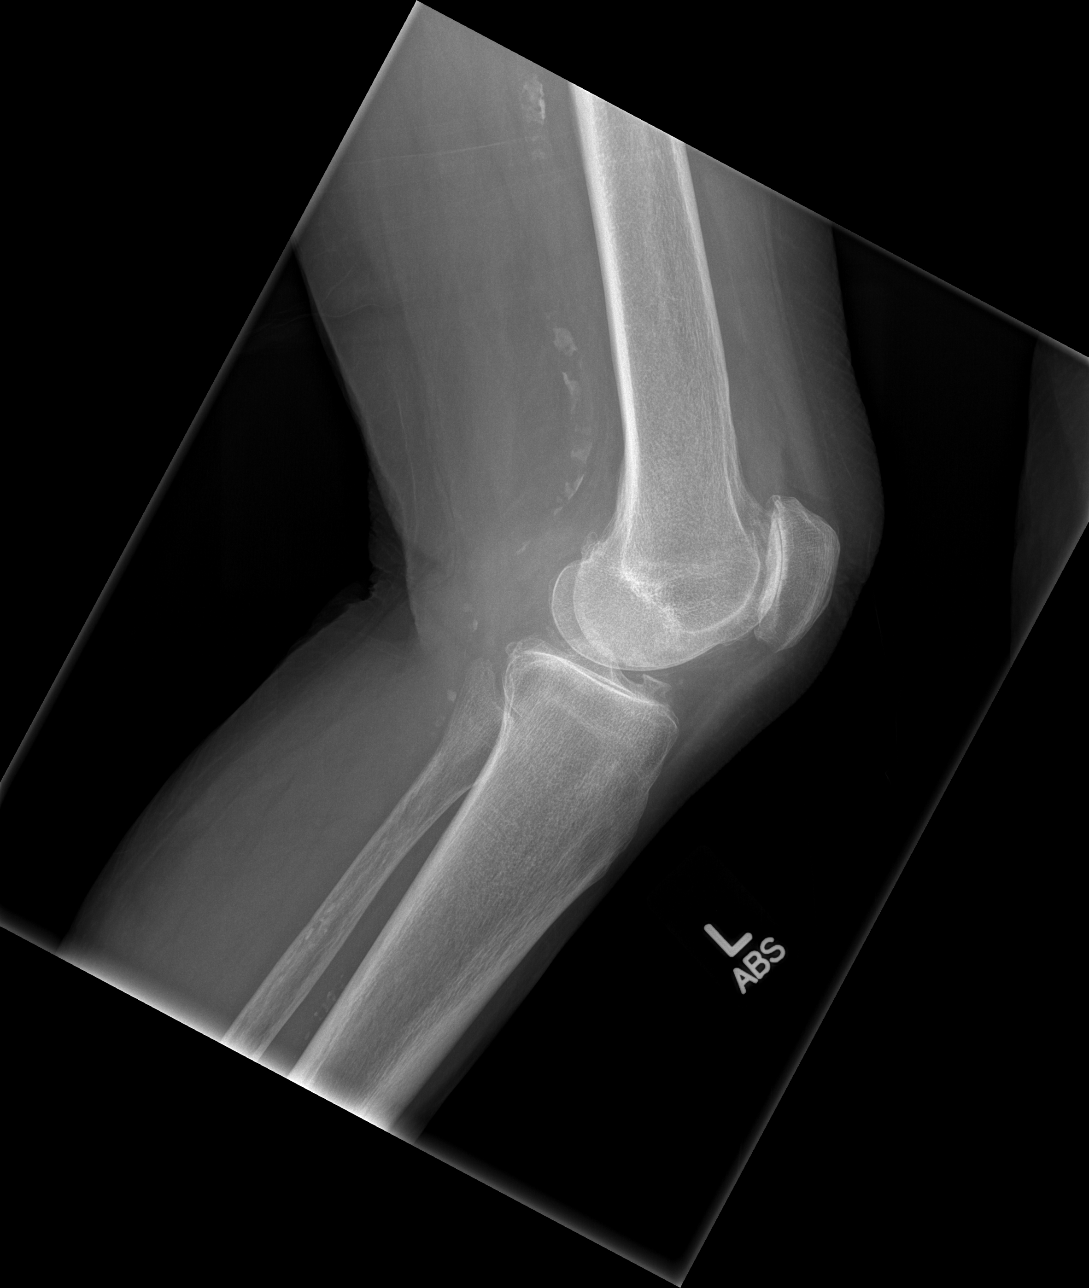

[4 of 4 positions shown; findings below may reference images not displayed]

FINDINGS: The bones are osteopenic. There is no acute fracture nor
dislocation. There is beaking of the tibial spines. A marginal
osteophyte is noted from the lateral tibial condyle. There is mild
degenerative change of the patellofemoral joint. There is may be a
small joint effusion.
IMPRESSION: There are degenerative changes of the left knee. There is no acute
fracture nor dislocation.

## 2015-03-07 IMAGING — CR DG CHEST 2V
1 series · 2 of 2 positions shown · non-contrast
Comparison: 06/05/2014 and 05/27/2014, CT chest abdomen and pelvis
06/01/1999

CLINICAL DATA: Right-sided pneumonia.

EXAM:
CHEST  2 VIEW

[Series 1: w chest pa · 0.14mm/px · 2 of 2 slices shown]
[im 1/2]
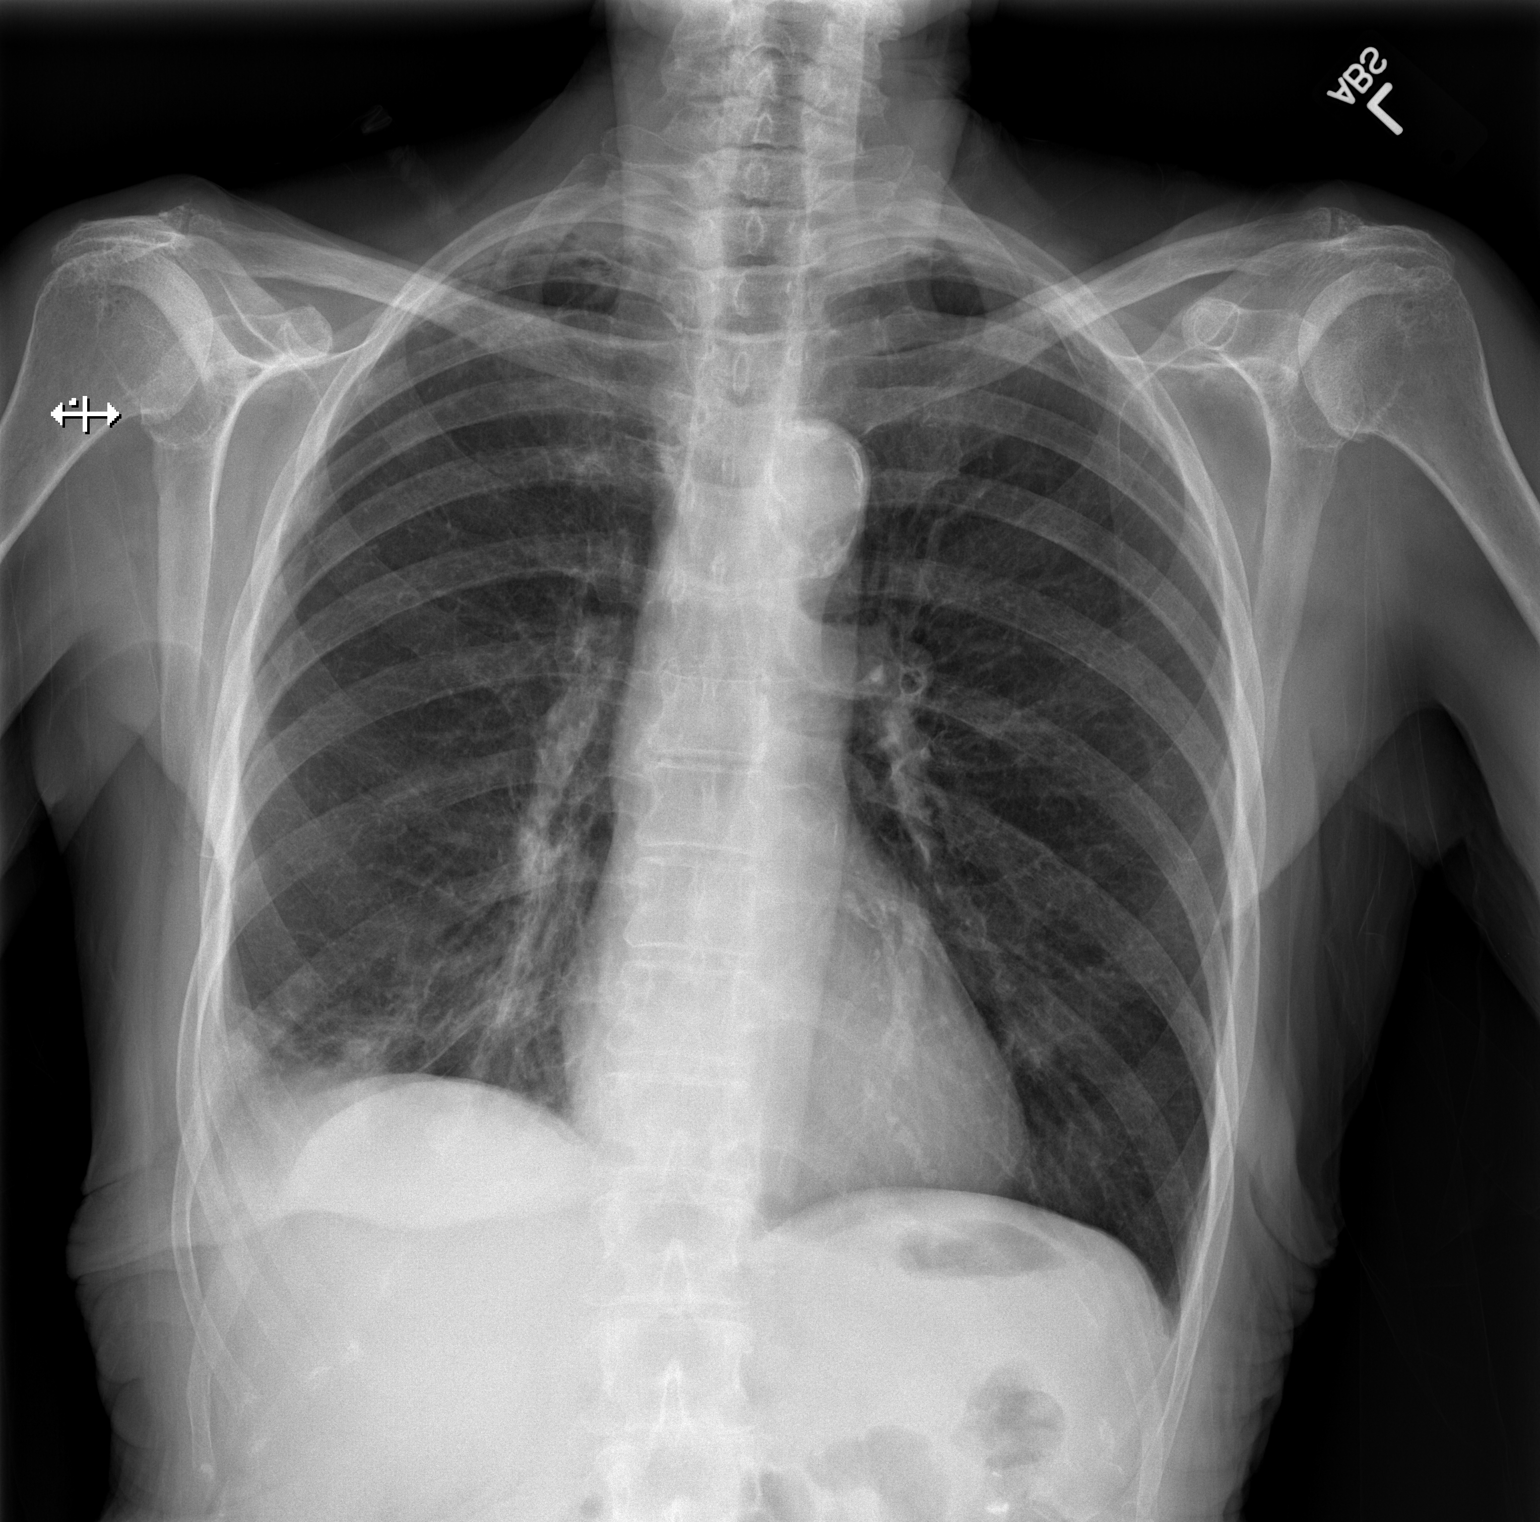
[im 2/2]
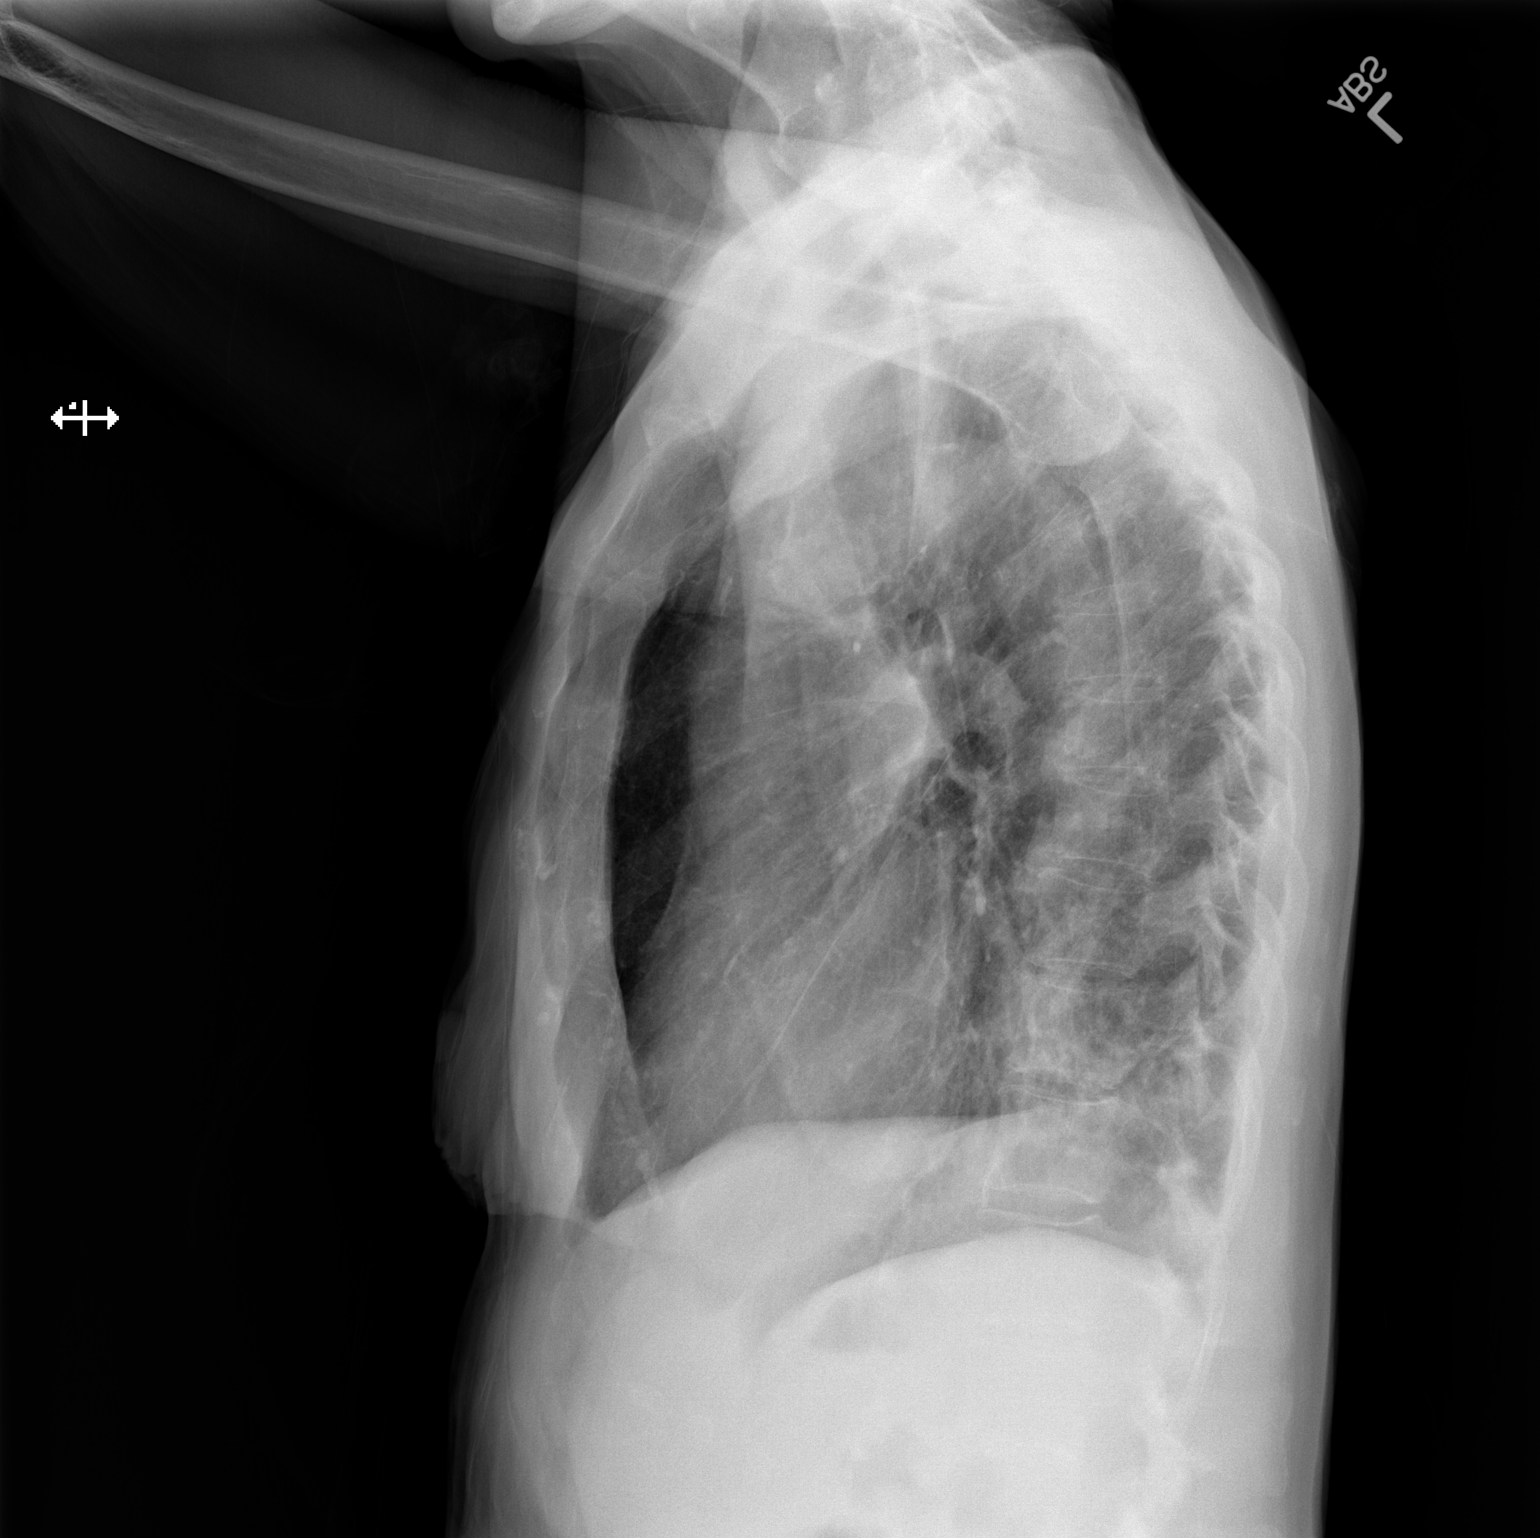

[2 of 2 positions shown; findings below may reference images not displayed]

FINDINGS: The heart size and mediastinal contours are within normal limits.
There is atherosclerotic calcification of the of thoracic aortic
arch. Pleural parenchymal scarring in the lung apices noted.

There is a persistent small area of focal airspace disease in the
peripheral right lower lobe, likely reflecting pneumonia. No new
areas of airspace disease are identified in either lung. Left lung
remains clear. No definite pleural effusions. The visualized
skeletal structures are unremarkable.
IMPRESSION: Persistent small opacity in the posterior right lower lobe. Given
appearances on CT chest abdomen pelvis 05/31/2014, likely reflects
residual/persistent pneumonia. No new focal abnormalities are
identified.

## 2015-03-11 NOTE — H&P (Signed)
PATIENT NAME:  Chelsea Horne, KNODEL MR#:  [id number] DATE OF BIRTH:  05-04-1927  DATE OF ADMISSION:  06/05/2014  PRIMARY CARE PHYSICIAN: Cletis Athens, MD  CHIEF COMPLAINT: Right-sided chest pain along with weakness and fatigue.   HISTORY OF PRESENT ILLNESS: This is an 81 year old female who presents to the hospital due to fatigue, weakness and worsening right-sided chest/back pain. The patient has been having these symptoms now for about week to 2 weeks. She saw her primary care physician, who ordered a chest x-ray and also a lumbar x-ray on her about 8 to 9 days ago, who diagnosed her with suspected pneumonia and put her on some oral Levaquin. The patient has been on oral Levaquin for now 4 days but has not improved at all. She continues to have a cough which is productive in nature but no fever and continues to have some right-sided chest pain. She has also had poor p.o. intake now for the past few days. She came to the ER for further evaluation and was noted to be hyponatremic and also noted to have a right lower lobe infiltrate consistent with acute pneumonia. Hospitalist services were contacted for further treatment and evaluation.   REVIEW OF SYSTEMS:    CONSTITUTIONAL: No documented fever. Positive weakness and fatigue. No weight gain or weight loss.  EYES: No blurry or double vision.  ENT: No tinnitus. No postnasal drip. No redness of the oropharynx.  RESPIRATORY: Positive cough. No wheeze. No hemoptysis. Positive dyspnea.  CARDIOVASCULAR: Positive right-sided chest pain. No orthopnea. No palpitations, no syncope.  GASTROINTESTINAL: No nausea, no vomiting. No diarrhea. No abdominal pain. No melena or hematochezia.  GENITOURINARY: No dysuria or hematuria.  ENDOCRINE: No polyuria or nocturia. No heat or nocturia cold intolerance.  HEMATOLOGIC: No anemia, no bruising, no bleeding.  INTEGUMENTARY: No rashes or lesions.  MUSCULOSKELETAL: No arthritis, no swelling, no gout.  NEUROLOGIC: No  numbness or tingling. No ataxia. No seizure-type activity. PSYCHIATRIC: No anxiety. No insomnia. No attention deficit disorder.    PAST MEDICAL HISTORY: Consistent with a history of myelodysplasia with chronic anemia, hypertension, breast cancer.   ALLERGIES:  PENICILLIN AND SULFA DRUGS, BOTH OF WHICH CAUSED HIVES.   SOCIAL HISTORY: The patient has a 30 to 40 pack-year smoking history but quit about 2 months ago. No alcohol abuse. No illicit drug abuse.  Lives with her daughter.   FAMILY HISTORY: Mother and father are both deceased. Mother died from childbirth. Father died from old age.   CURRENT MEDICATIONS: As follows: Advair 250/50 one puff b.i.d., Diovan 40 mg daily, calcium with vitamin D 1 tab daily, aspirin 81 mg daily, Fosamax 70 mg weekly, gabapentin 100 mg daily, Levaquin 500 mg daily, tramadol 50 mg b.i.d., vitamin C daily.   PHYSICAL EXAMINATION: Presently is as follows:  VITAL SIGNS: Temperature 97.9, pulse 75, respirations 18, blood pressure 129/50, sats 100% on 2 liters nasal cannula.  GENERAL: She is a pleasant-appearing female in no apparent distress.  HEAD, EYES, EARS, NOSE, THROAT: She is atraumatic, normocephalic. Her extraocular muscles are intact.  Pupils equal and reactive to light. Sclerae anicteric. No conjunctival injection. No oropharyngeal erythema.  NECK: Supple. There is no jugular venous distention. No bruits, lymphadenopathy or thyromegaly.  HEART: Regular rate and rhythm. No murmurs. No rubs, no clicks.  LUNGS: She has some coarse bibasilar crackles but no other wheezing or rhonchi. Negative use of accessory muscles. No dullness to percussion.  ABDOMEN: Soft, flat, nontender, nondistended. Has good bowel sounds. No hepatosplenomegaly appreciated.  EXTREMITIES: No evidence of any cyanosis, clubbing or peripheral edema. Has +2 pedal and radial pulses bilaterally.  NEUROLOGICAL: The patient is alert, awake and oriented x 3 with no focal motor or sensory deficits  appreciated bilaterally.  SKIN: Moist and warm with no rashes appreciated.  LYMPHATIC: There is no cervical or axillary lymphadenopathy.   LABORATORY, DIAGNOSTIC AND RADIOLOGICAL DATA:   1.  Serum glucose of 94, BUN 17, creatinine 1.12, sodium 128, potassium 4, chloride 93, bicarbonate 27. The patient's LFTs are within normal limits. Troponin less than 0.02. White cell count 4.5, hemoglobin 7.3, hematocrit 21.2, platelet count 215. Urinalysis within normal limits.  2.  The patient did have a chest x-ray done which showed right lung base opacity increased from prior surgery. This may reflect atelectasis, scarring or pneumonia.   ASSESSMENT AND PLAN: This is an 81 year old female with a history of mild dysphagia, hypertension, breast cancer, presented to the hospital due to fatigue, weakness and also right-sided pleuritic chest pain, noted to have acute pneumonia.  1.  Pneumonia: This is likely the cause of the patient's weakness, fatigue and right-sided pleuritic chest pain. The patient has failed outpatient oral antibiotic therapy with Levaquin as she has not improved despite taking that for 4 days. For now, I will admit her to the hospital, start her on IV ceftriaxone and Zithromax. Follow her sputum and blood cultures. Follow her clinically. She is currently afebrile. Her white cell count is normal.  2.  Hyponatremia.  This is likely hypovolemic hyponatremia due to poor p.o. intake. I will gently hydrate her with IV fluids, follow sodium. There is also some concern of possible syndrome of inappropriate secretion of antidiuretic hormone due to pneumonia. Therefore, we will follow sodium closely.  3.  Anemia. This is chronic due to her myelodysplasia. Her baseline hemoglobin is around 8.3 to 8.5, currently down to 7.3. There is no other evidence of acute bleeding. She does not meet any acute criteria for transfusion, therefore, I follow her hemoglobin for now. Continue her Procrit shots.  Will consult  with hematology if needed.  4.  Hypertension. Continue losartan.  5.  History of breast cancer: This is currently in remission and the patient is followed by Dr. Grayland Ormond.  6.  CODE STATUS: The patient is a full code.   TIME SPENT: 50 minutes.    ____________________________ Belia Heman. Verdell Carmine, MD vjs:cs D: 06/05/2014 16:47:17 ET T: 06/05/2014 18:07:36 ET JOB#: [id number]  cc: Belia Heman. Verdell Carmine, MD, <Dictator> Henreitta Leber MD ELECTRONICALLY SIGNED 06/16/2014 14:05

## 2015-03-11 NOTE — Discharge Summary (Signed)
PATIENT NAME:  Chelsea Horne, STOCKBURGER MR#:  [id number] DATE OF BIRTH:  [birth year]  DATE OF ADMISSION:  06/05/2014 DATE OF DISCHARGE:  06/08/2014  For a detailed note, please see the history and physical done on admission.  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   DIET:  The patient is being discharged on a low-sodium diet.   ACTIVITY: As tolerated.   FOLLOWUP:  Follow up with Dr. Rebecka Apley in the next 1-2 weeks.   DISCHARGE MEDICATIONS: Aspirin 81 mg daily, vitamin C 500 mg daily, calcium vitamin D 1 tablet daily, Diovan 40 mg daily, gabapentin 100 mg daily, Fosamax 70 mg weekly, Advair 250 one puff b.i.d., tramadol 50 mg b.i.d. and doxycycline 100 mg b.i.d. x7 days.   PERTINENT STUDIES DONE DURING THE HOSPITAL COURSE: A chest x-ray done on admission showing right lung base opacity increased from prior study. This may reflect atelectasis, scarring or pneumonia.   The patient's blood cultures noted to be negative.   HOSPITAL COURSE: This is an 81 year old female with medical problems as mentioned above, presented to the hospital with shortness of breath and weakness and noted to have an acute pneumonia and also noted to be hyponatremic.  1.  Pneumonia. This was likely the cause of the patient's weakness, fatigue and right-sided chest pain. The patient apparently had failed outpatient antibiotic therapy with Levaquin.  She had taken 4 days of Levaquin prior to coming to the Emergency Room. The patient was admitted to the hospital, started on IV ceftriaxone and Zithromax. After a few days of IV antibiotic therapy, patient's clinical symptoms have significantly improved. Blood cultures are negative. She is currently afebrile for the past 48 hours and hemodynamically stable. Her shortness of breath and right-sided pleuritic chest pain has improved. She is therefore being discharged on oral doxycycline presently.  2.  Hyponatremia. This is  likely hypovolemic hyponatremia due to her poor p.o. intake. She received some IV fluids and sodium did improve. Sodium on the day prior to discharge was 131. 3.  Anemia.  The most likely cause of patient's anemia was myelodysplasia. The patient has a history of this. We did do a Hemoccult which was negative. The patient's hemoglobin did drop down to as low as 6.2. She normally lives with a hemoglobin around 8. She was transfused 1 unit of packed red blood cells. Her hemoglobin has remained stable post transfusion. The patient will continue to follow up at the cancer center and continue her Procrit shots every 2 weeks.  4.  Hypertension. The patient was maintained on valsartan.  She will resume that.  5.  History of breast cancer: This is currently in remission. The patient follows up with Dr. Grayland Ormond at the cancer center.   CODE STATUS:  The patient is a full code.   DISPOSITION:  The patient is being discharged home with home health physical therapy services.   TIME SPENT: 40 minutes.    ____________________________ Belia Heman. Verdell Carmine, MD vjs:ds D: 06/08/2014 13:43:25 ET T: 06/08/2014 15:02:14 ET JOB#: [id number]  cc: Belia Heman. Verdell Carmine, MD, <Dictator> Cletis Athens, MD Henreitta Leber MD ELECTRONICALLY SIGNED 06/16/2014 14:06

## 2015-03-13 ENCOUNTER — Other Ambulatory Visit: Payer: Self-pay | Admitting: Oncology

## 2015-03-13 DIAGNOSIS — D638 Anemia in other chronic diseases classified elsewhere: Secondary | ICD-10-CM

## 2015-03-13 DIAGNOSIS — D469 Myelodysplastic syndrome, unspecified: Secondary | ICD-10-CM | POA: Insufficient documentation

## 2015-03-14 IMAGING — CR DG CHEST 2V
1 series · 2 of 2 positions shown · non-contrast
Comparison: 06/13/2014.

CLINICAL DATA: Followup pulmonary infiltrate.

EXAM:
CHEST  2 VIEW

[Series 7: w chest pa · 0.14mm/px · 2 of 2 slices shown]
[im 1/2]
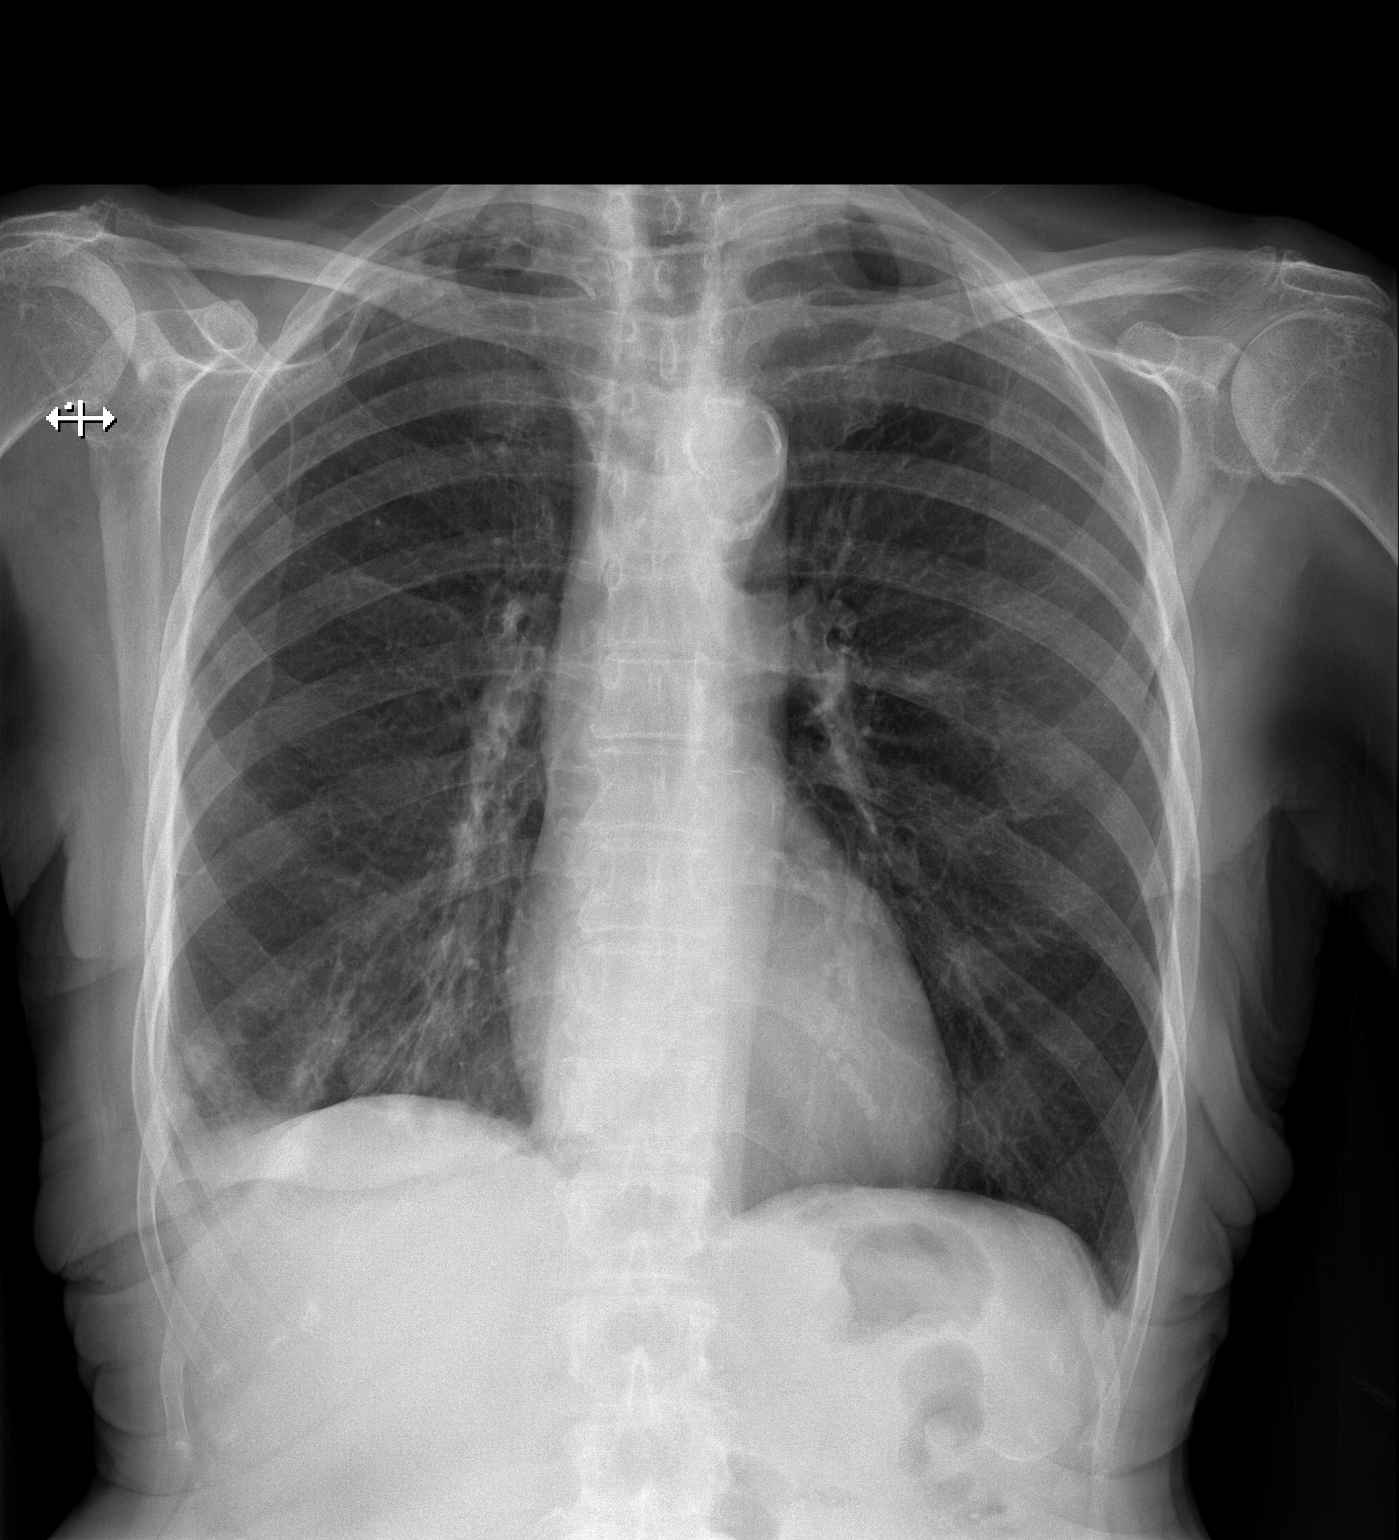
[im 2/2]
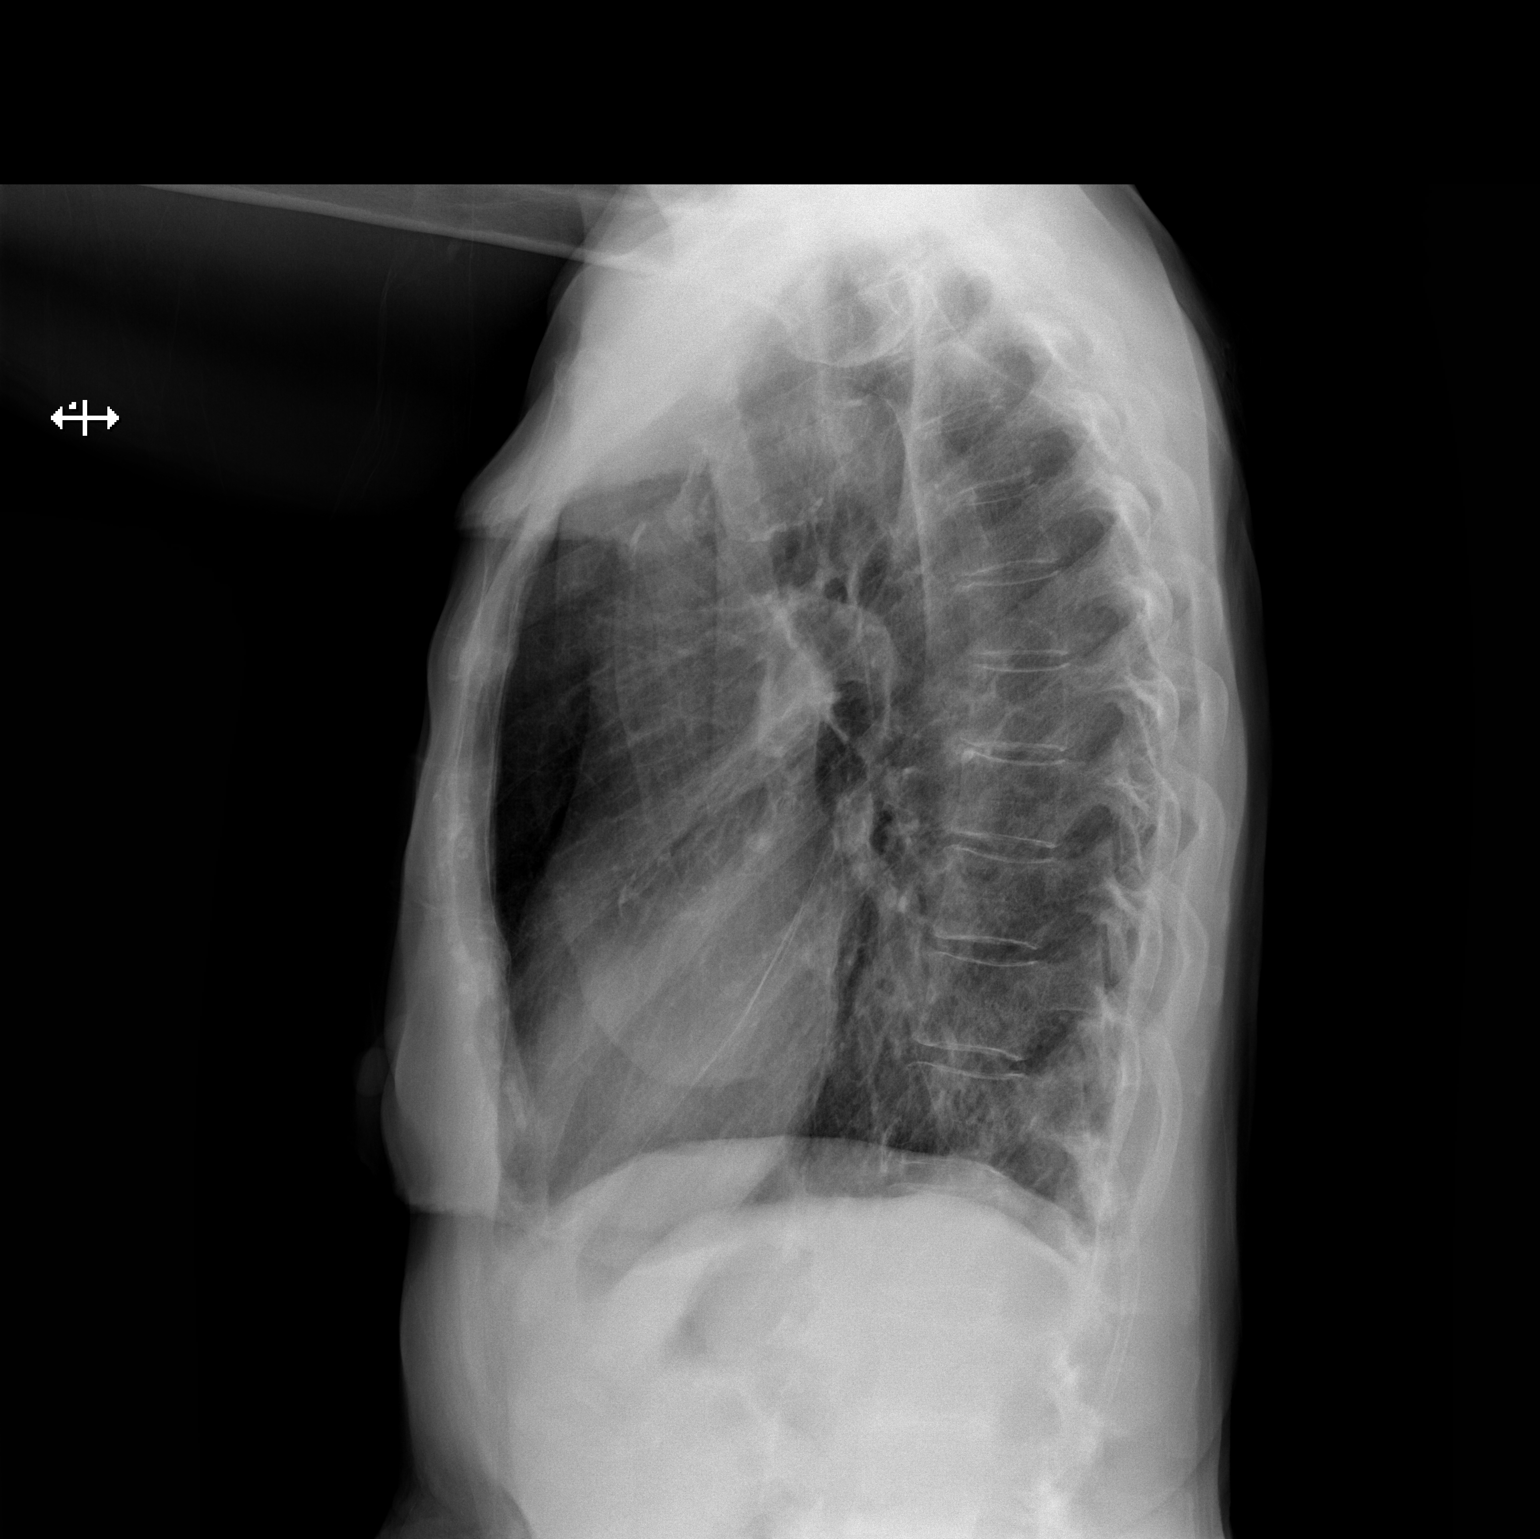

[2 of 2 positions shown; findings below may reference images not displayed]

FINDINGS: The cardiac silhouette, mediastinal and hilar contours are normal
and stable. There is mild tortuosity and calcification of the
thoracic aorta. There are chronic lung changes with biapical
scarring the right basilar process is slightly improved. There is a
persistent small effusion and probable post pneumonic scarring and
atelectasis.
IMPRESSION: Resolving right lower lobe process.

## 2015-03-18 ENCOUNTER — Other Ambulatory Visit: Payer: Self-pay | Admitting: Oncology

## 2015-03-22 ENCOUNTER — Inpatient Hospital Stay: Payer: Medicare Other

## 2015-03-22 ENCOUNTER — Inpatient Hospital Stay: Payer: Medicare Other | Attending: Oncology

## 2015-03-22 VITALS — BP 155/64 | HR 64 | Resp 18

## 2015-03-22 DIAGNOSIS — D638 Anemia in other chronic diseases classified elsewhere: Secondary | ICD-10-CM

## 2015-03-22 DIAGNOSIS — I10 Essential (primary) hypertension: Secondary | ICD-10-CM | POA: Insufficient documentation

## 2015-03-22 DIAGNOSIS — Z79899 Other long term (current) drug therapy: Secondary | ICD-10-CM | POA: Insufficient documentation

## 2015-03-22 LAB — CBC
HCT: 27.7 % — ABNORMAL LOW (ref 35.0–47.0)
Hemoglobin: 9.3 g/dL — ABNORMAL LOW (ref 12.0–16.0)
MCH: 32.4 pg (ref 26.0–34.0)
MCHC: 33.7 g/dL (ref 32.0–36.0)
MCV: 96.2 fL (ref 80.0–100.0)
Platelets: 146 10*3/uL — ABNORMAL LOW (ref 150–440)
RBC: 2.88 MIL/uL — AB (ref 3.80–5.20)
RDW: 16.9 % — ABNORMAL HIGH (ref 11.5–14.5)
WBC: 2.2 10*3/uL — ABNORMAL LOW (ref 3.6–11.0)

## 2015-03-22 MED ORDER — EPOETIN ALFA 40000 UNIT/ML IJ SOLN
40000.0000 [IU] | Freq: Once | INTRAMUSCULAR | Status: AC
  Administered 2015-03-22: 40000 [IU] via SUBCUTANEOUS
  Filled 2015-03-22: qty 1

## 2015-04-11 ENCOUNTER — Other Ambulatory Visit: Payer: Self-pay | Admitting: *Deleted

## 2015-04-11 DIAGNOSIS — D638 Anemia in other chronic diseases classified elsewhere: Secondary | ICD-10-CM

## 2015-04-12 ENCOUNTER — Inpatient Hospital Stay: Payer: Medicare Other

## 2015-04-12 VITALS — BP 124/70 | HR 67 | Resp 18

## 2015-04-12 DIAGNOSIS — Z79899 Other long term (current) drug therapy: Secondary | ICD-10-CM | POA: Diagnosis not present

## 2015-04-12 DIAGNOSIS — D638 Anemia in other chronic diseases classified elsewhere: Secondary | ICD-10-CM | POA: Diagnosis not present

## 2015-04-12 DIAGNOSIS — I10 Essential (primary) hypertension: Secondary | ICD-10-CM | POA: Diagnosis not present

## 2015-04-12 LAB — HEMOGLOBIN: Hemoglobin: 8 g/dL — ABNORMAL LOW (ref 12.0–16.0)

## 2015-04-12 MED ORDER — EPOETIN ALFA 40000 UNIT/ML IJ SOLN
40000.0000 [IU] | Freq: Once | INTRAMUSCULAR | Status: AC
  Administered 2015-04-12: 40000 [IU] via SUBCUTANEOUS
  Filled 2015-04-12: qty 1

## 2015-05-03 ENCOUNTER — Inpatient Hospital Stay: Payer: Medicare Other | Attending: Oncology

## 2015-05-03 ENCOUNTER — Ambulatory Visit: Payer: Medicare Other

## 2015-05-03 VITALS — BP 128/67 | HR 70 | Resp 18

## 2015-05-03 DIAGNOSIS — D638 Anemia in other chronic diseases classified elsewhere: Secondary | ICD-10-CM | POA: Insufficient documentation

## 2015-05-03 LAB — HEMOGLOBIN: Hemoglobin: 8.1 g/dL — ABNORMAL LOW (ref 12.0–16.0)

## 2015-05-03 MED ORDER — EPOETIN ALFA 40000 UNIT/ML IJ SOLN
40000.0000 [IU] | Freq: Once | INTRAMUSCULAR | Status: AC
  Administered 2015-05-03: 40000 [IU] via SUBCUTANEOUS
  Filled 2015-05-03: qty 1

## 2015-05-10 ENCOUNTER — Inpatient Hospital Stay: Payer: Medicare Other

## 2015-05-10 DIAGNOSIS — D638 Anemia in other chronic diseases classified elsewhere: Secondary | ICD-10-CM

## 2015-05-10 LAB — CBC
HEMATOCRIT: 25.7 % — AB (ref 35.0–47.0)
Hemoglobin: 8.6 g/dL — ABNORMAL LOW (ref 12.0–16.0)
MCH: 32.1 pg (ref 26.0–34.0)
MCHC: 33.6 g/dL (ref 32.0–36.0)
MCV: 95.6 fL (ref 80.0–100.0)
Platelets: 194 10*3/uL (ref 150–440)
RBC: 2.69 MIL/uL — ABNORMAL LOW (ref 3.80–5.20)
RDW: 18.2 % — ABNORMAL HIGH (ref 11.5–14.5)
WBC: 2.6 10*3/uL — AB (ref 3.6–11.0)

## 2015-05-24 ENCOUNTER — Inpatient Hospital Stay: Payer: Medicare Other

## 2015-05-24 ENCOUNTER — Inpatient Hospital Stay: Payer: Medicare Other | Attending: Oncology

## 2015-05-24 VITALS — BP 154/68 | HR 80 | Resp 20

## 2015-05-24 DIAGNOSIS — R5383 Other fatigue: Secondary | ICD-10-CM | POA: Insufficient documentation

## 2015-05-24 DIAGNOSIS — D638 Anemia in other chronic diseases classified elsewhere: Secondary | ICD-10-CM

## 2015-05-24 DIAGNOSIS — Z90710 Acquired absence of both cervix and uterus: Secondary | ICD-10-CM | POA: Insufficient documentation

## 2015-05-24 DIAGNOSIS — K449 Diaphragmatic hernia without obstruction or gangrene: Secondary | ICD-10-CM | POA: Diagnosis not present

## 2015-05-24 DIAGNOSIS — R531 Weakness: Secondary | ICD-10-CM | POA: Insufficient documentation

## 2015-05-24 DIAGNOSIS — Z801 Family history of malignant neoplasm of trachea, bronchus and lung: Secondary | ICD-10-CM | POA: Diagnosis not present

## 2015-05-24 DIAGNOSIS — Z9013 Acquired absence of bilateral breasts and nipples: Secondary | ICD-10-CM | POA: Insufficient documentation

## 2015-05-24 DIAGNOSIS — Z87440 Personal history of urinary (tract) infections: Secondary | ICD-10-CM | POA: Insufficient documentation

## 2015-05-24 DIAGNOSIS — F0390 Unspecified dementia without behavioral disturbance: Secondary | ICD-10-CM | POA: Diagnosis not present

## 2015-05-24 DIAGNOSIS — I10 Essential (primary) hypertension: Secondary | ICD-10-CM | POA: Insufficient documentation

## 2015-05-24 DIAGNOSIS — Z853 Personal history of malignant neoplasm of breast: Secondary | ICD-10-CM | POA: Diagnosis not present

## 2015-05-24 LAB — HEMOGLOBIN: Hemoglobin: 7.9 g/dL — ABNORMAL LOW (ref 12.0–16.0)

## 2015-05-24 MED ORDER — EPOETIN ALFA 40000 UNIT/ML IJ SOLN
40000.0000 [IU] | Freq: Once | INTRAMUSCULAR | Status: AC
Start: 2015-05-24 — End: 2015-05-24
  Administered 2015-05-24: 40000 [IU] via SUBCUTANEOUS
  Filled 2015-05-24: qty 1

## 2015-06-14 ENCOUNTER — Inpatient Hospital Stay: Payer: Medicare Other

## 2015-06-14 ENCOUNTER — Inpatient Hospital Stay (HOSPITAL_BASED_OUTPATIENT_CLINIC_OR_DEPARTMENT_OTHER): Payer: Medicare Other | Admitting: Oncology

## 2015-06-14 ENCOUNTER — Encounter: Payer: Self-pay | Admitting: Oncology

## 2015-06-14 VITALS — BP 145/65 | HR 76 | Resp 18

## 2015-06-14 DIAGNOSIS — R5383 Other fatigue: Secondary | ICD-10-CM

## 2015-06-14 DIAGNOSIS — R531 Weakness: Secondary | ICD-10-CM

## 2015-06-14 DIAGNOSIS — I10 Essential (primary) hypertension: Secondary | ICD-10-CM | POA: Diagnosis not present

## 2015-06-14 DIAGNOSIS — D638 Anemia in other chronic diseases classified elsewhere: Secondary | ICD-10-CM

## 2015-06-14 DIAGNOSIS — F0390 Unspecified dementia without behavioral disturbance: Secondary | ICD-10-CM

## 2015-06-14 DIAGNOSIS — Z9013 Acquired absence of bilateral breasts and nipples: Secondary | ICD-10-CM

## 2015-06-14 DIAGNOSIS — Z801 Family history of malignant neoplasm of trachea, bronchus and lung: Secondary | ICD-10-CM

## 2015-06-14 DIAGNOSIS — Z853 Personal history of malignant neoplasm of breast: Secondary | ICD-10-CM

## 2015-06-14 DIAGNOSIS — K449 Diaphragmatic hernia without obstruction or gangrene: Secondary | ICD-10-CM | POA: Diagnosis not present

## 2015-06-14 DIAGNOSIS — Z90710 Acquired absence of both cervix and uterus: Secondary | ICD-10-CM

## 2015-06-14 DIAGNOSIS — Z87440 Personal history of urinary (tract) infections: Secondary | ICD-10-CM

## 2015-06-14 LAB — HEMOGLOBIN: Hemoglobin: 8.3 g/dL — ABNORMAL LOW (ref 12.0–16.0)

## 2015-06-14 MED ORDER — EPOETIN ALFA 20000 UNIT/ML IJ SOLN
60000.0000 [IU] | Freq: Once | INTRAMUSCULAR | Status: DC
  Filled 2015-06-14: qty 3

## 2015-06-14 MED ORDER — EPOETIN ALFA 20000 UNIT/ML IJ SOLN
20000.0000 [IU] | Freq: Once | INTRAMUSCULAR | Status: AC
Start: 2015-06-14 — End: 2015-06-14
  Administered 2015-06-14: 20000 [IU] via SUBCUTANEOUS

## 2015-06-14 MED ORDER — EPOETIN ALFA 40000 UNIT/ML IJ SOLN
40000.0000 [IU] | Freq: Once | INTRAMUSCULAR | Status: AC
  Administered 2015-06-14: 40000 [IU] via SUBCUTANEOUS
  Filled 2015-06-14: qty 1

## 2015-06-20 NOTE — Progress Notes (Signed)
Chelsea Horne  [phone] [phone]) [phone]  ID: Chelsea Horne OB: [birth year]  MR#: [id number]  AUQ#:[id number]  Patient Care Team: Cletis Athens, MD as PCP - General (Internal Medicine)  CHIEF COMPLAINT:  Chief Complaint  Patient presents with  . Follow-up    Anemia    INTERVAL HISTORY: Patient returns to clinic today for laboratory work and consideration of additional Procrit.  She continues to have chronic weakness and fatigue, but otherwise feels well. Her daughter reports some increasing dementia.  She denies any recent fevers, chills, night sweats, or weight loss.  She has no chest pain or shortness of breath.  She has a good appetite and denies any nausea, vomiting, constipation, or diarrhea.  She denies any melena or hematochezia.  She has no urinary complaints.  Patient offers no further specific complaints today.   REVIEW OF SYSTEMS:   Review of Systems  Constitutional: Positive for malaise/fatigue.  Neurological: Positive for weakness.  Psychiatric/Behavioral: Positive for memory loss.    As per HPI. Otherwise, a complete review of systems is negatve.  PAST MEDICAL HISTORY: Past Medical History  Diagnosis Date  . Hypertension   . Breast cancer   . Hiatal hernia   . UTI (lower urinary tract infection)     PAST SURGICAL HISTORY: Past Surgical History  Procedure Laterality Date  . Abdominal hysterectomy    . Mastectomy      modified radical in August 1996  . Foot surgery      for hammertoe    FAMILY HISTORY Family History  Problem Relation Age of Onset  . Cancer Brother     lung  . Hypertension Brother   . Anemia Brother   . Myelodysplastic syndrome Brother        ADVANCED DIRECTIVES:    HEALTH MAINTENANCE: History  Substance Use Topics  . Smoking status: Never Smoker   . Smokeless tobacco: Never Used  . Alcohol Use: No     Colonoscopy:  PAP:  Bone density:  Lipid panel:  Allergies  Allergen Reactions    . Nitrofurantoin Other (See Comments)  . Penicillins Itching  . Sulfa Antibiotics Itching    Current Outpatient Prescriptions  Medication Sig Dispense Refill  . acetaminophen (RA ACETAMINOPHEN) 650 MG CR tablet Take by mouth.    Marland Kitchen alendronate (FOSAMAX) 70 MG tablet   0  . Ascorbic Acid (VITAMIN C) 1000 MG tablet Take 1,000 mg by mouth daily.    Marland Kitchen aspirin 81 MG tablet Take 81 mg by mouth daily.    . calcium-vitamin D (OSCAL WITH D) 250-125 MG-UNIT per tablet Take 1 tablet by mouth daily.    Marland Kitchen gabapentin (NEURONTIN) 100 MG capsule Take 100 mg by mouth 3 (three) times daily.    . psyllium (METAMUCIL) 58.6 % packet Take 1 packet by mouth daily.    . traMADol (ULTRAM) 50 MG tablet Take 50 mg by mouth 2 (two) times daily.    . valsartan (DIOVAN) 40 MG tablet Take 40 mg by mouth daily.    . valsartan-hydrochlorothiazide (DIOVAN-HCT) 80-12.5 MG per tablet   0   No current facility-administered medications for this visit.    OBJECTIVE: There were no vitals filed for this visit.   There is no weight on file to calculate BMI.    ECOG FS:1 - Symptomatic but completely ambulatory  General: Well-developed, well-nourished, no acute distress. Eyes: Pink conjunctiva, anicteric sclera. Lungs: Clear to auscultation bilaterally. Heart: Regular rate and rhythm. No rubs, murmurs, or gallops.  Abdomen: Soft, nontender, nondistended. No organomegaly noted, normoactive bowel sounds. Musculoskeletal: No edema, cyanosis, or clubbing. Neuro: Alert, answering all questions appropriately. Cranial nerves grossly intact. Skin: No rashes or petechiae noted. Psych: Normal affect.  LAB RESULTS:  Lab Results  Component Value Date   NA 129* 06/10/2014   K 4.3 06/10/2014   CL 96* 06/10/2014   CO2 23 06/10/2014   GLUCOSE 140* 06/10/2014   BUN 22* 06/10/2014   CREATININE 1.24 06/10/2014   CALCIUM 10.3* 06/10/2014   PROT 7.1 06/10/2014   ALBUMIN 2.3* 06/10/2014   AST 11* 06/10/2014   ALT 13* 06/10/2014    ALKPHOS 70 06/10/2014   BILITOT 0.4 06/10/2014   GFRNONAA 40* 06/10/2014   GFRAA 46* 06/10/2014    Lab Results  Component Value Date   WBC 2.6* 05/10/2015   NEUTROABS 0.9* 03/01/2015   HGB 8.3* 06/14/2015   HCT 25.7* 05/10/2015   MCV 95.6 05/10/2015   PLT 194 05/10/2015     STUDIES: No results found.  ASSESSMENT:  Anemia of chronic disease.  PLAN:    1. Anemia:  Patient's hemoglobin  Continues to be decreased and less than 10.0 at 8.3. Proceed with 40,000 units subcutaneous Procrit today.  Her most recent iron stores from March 01, 2015 or within normal limits. Return to clinic every 3 weeks for laboratory work and consideration of Procrit. Patient will then return to clinic in 3 months with repeat laboratory work and further evaluation.  2.  History of breast cancer: No evidence of disease.  Patient's mammogram's are ordered by her PCP. 3.  Hypertension: Treatment per PCP.   Patient expressed understanding and was in agreement with this plan. She also understands that She can call clinic at any time with any questions, concerns, or complaints.    [name], MD   06/20/2015 5:13 PM

## 2015-06-28 DIAGNOSIS — F418 Other specified anxiety disorders: Secondary | ICD-10-CM | POA: Diagnosis not present

## 2015-06-28 DIAGNOSIS — D638 Anemia in other chronic diseases classified elsewhere: Secondary | ICD-10-CM | POA: Diagnosis not present

## 2015-06-28 DIAGNOSIS — R5383 Other fatigue: Secondary | ICD-10-CM | POA: Diagnosis not present

## 2015-06-28 DIAGNOSIS — M5093 Cervical disc disorder, unspecified, cervicothoracic region: Secondary | ICD-10-CM | POA: Diagnosis not present

## 2015-07-05 ENCOUNTER — Inpatient Hospital Stay: Payer: Medicare Other | Attending: Oncology

## 2015-07-05 ENCOUNTER — Inpatient Hospital Stay: Payer: Medicare Other

## 2015-07-05 VITALS — BP 115/63 | HR 85 | Temp 96.9°F | Resp 18

## 2015-07-05 DIAGNOSIS — D638 Anemia in other chronic diseases classified elsewhere: Secondary | ICD-10-CM

## 2015-07-05 DIAGNOSIS — Z79899 Other long term (current) drug therapy: Secondary | ICD-10-CM | POA: Diagnosis not present

## 2015-07-05 DIAGNOSIS — I10 Essential (primary) hypertension: Secondary | ICD-10-CM | POA: Insufficient documentation

## 2015-07-05 LAB — HEMOGLOBIN: Hemoglobin: 8.2 g/dL — ABNORMAL LOW (ref 12.0–16.0)

## 2015-07-05 MED ORDER — EPOETIN ALFA 40000 UNIT/ML IJ SOLN
40000.0000 [IU] | Freq: Once | INTRAMUSCULAR | Status: AC
  Administered 2015-07-05: 40000 [IU] via SUBCUTANEOUS
  Filled 2015-07-05: qty 1

## 2015-07-26 ENCOUNTER — Inpatient Hospital Stay: Payer: Medicare Other

## 2015-07-26 ENCOUNTER — Inpatient Hospital Stay: Payer: Medicare Other | Attending: Oncology

## 2015-07-26 VITALS — BP 133/66 | HR 79 | Temp 97.0°F | Resp 18

## 2015-07-26 DIAGNOSIS — D638 Anemia in other chronic diseases classified elsewhere: Secondary | ICD-10-CM

## 2015-07-26 DIAGNOSIS — Z79899 Other long term (current) drug therapy: Secondary | ICD-10-CM | POA: Diagnosis not present

## 2015-07-26 LAB — HEMOGLOBIN: HEMOGLOBIN: 8.1 g/dL — AB (ref 12.0–16.0)

## 2015-07-26 MED ORDER — EPOETIN ALFA 40000 UNIT/ML IJ SOLN
40000.0000 [IU] | Freq: Once | INTRAMUSCULAR | Status: AC
  Administered 2015-07-26: 40000 [IU] via SUBCUTANEOUS
  Filled 2015-07-26: qty 1

## 2015-08-16 ENCOUNTER — Inpatient Hospital Stay: Payer: Medicare Other

## 2015-08-16 VITALS — BP 153/76 | HR 91 | Temp 97.0°F | Resp 18

## 2015-08-16 DIAGNOSIS — D638 Anemia in other chronic diseases classified elsewhere: Secondary | ICD-10-CM

## 2015-08-16 DIAGNOSIS — Z79899 Other long term (current) drug therapy: Secondary | ICD-10-CM | POA: Diagnosis not present

## 2015-08-16 LAB — HEMOGLOBIN: HEMOGLOBIN: 8.4 g/dL — AB (ref 12.0–16.0)

## 2015-08-16 MED ORDER — EPOETIN ALFA 40000 UNIT/ML IJ SOLN
40000.0000 [IU] | Freq: Once | INTRAMUSCULAR | Status: AC
  Administered 2015-08-16: 40000 [IU] via SUBCUTANEOUS
  Filled 2015-08-16: qty 1

## 2015-09-06 ENCOUNTER — Inpatient Hospital Stay: Payer: Medicare Other | Attending: Oncology

## 2015-09-06 ENCOUNTER — Inpatient Hospital Stay: Payer: Medicare Other

## 2015-09-06 VITALS — BP 134/55 | HR 92 | Temp 96.8°F | Resp 18

## 2015-09-06 DIAGNOSIS — K449 Diaphragmatic hernia without obstruction or gangrene: Secondary | ICD-10-CM | POA: Insufficient documentation

## 2015-09-06 DIAGNOSIS — D638 Anemia in other chronic diseases classified elsewhere: Secondary | ICD-10-CM

## 2015-09-06 DIAGNOSIS — Z79899 Other long term (current) drug therapy: Secondary | ICD-10-CM | POA: Insufficient documentation

## 2015-09-06 DIAGNOSIS — Z87440 Personal history of urinary (tract) infections: Secondary | ICD-10-CM | POA: Insufficient documentation

## 2015-09-06 DIAGNOSIS — R5383 Other fatigue: Secondary | ICD-10-CM | POA: Insufficient documentation

## 2015-09-06 DIAGNOSIS — I10 Essential (primary) hypertension: Secondary | ICD-10-CM | POA: Diagnosis not present

## 2015-09-06 DIAGNOSIS — R531 Weakness: Secondary | ICD-10-CM | POA: Diagnosis not present

## 2015-09-06 DIAGNOSIS — Z853 Personal history of malignant neoplasm of breast: Secondary | ICD-10-CM | POA: Diagnosis not present

## 2015-09-06 DIAGNOSIS — Z801 Family history of malignant neoplasm of trachea, bronchus and lung: Secondary | ICD-10-CM | POA: Diagnosis not present

## 2015-09-06 DIAGNOSIS — Z7982 Long term (current) use of aspirin: Secondary | ICD-10-CM | POA: Diagnosis not present

## 2015-09-06 LAB — HEMOGLOBIN: HEMOGLOBIN: 8.3 g/dL — AB (ref 12.0–16.0)

## 2015-09-06 MED ORDER — EPOETIN ALFA 40000 UNIT/ML IJ SOLN
40000.0000 [IU] | Freq: Once | INTRAMUSCULAR | Status: AC
  Administered 2015-09-06: 40000 [IU] via SUBCUTANEOUS
  Filled 2015-09-06: qty 1

## 2015-09-14 ENCOUNTER — Inpatient Hospital Stay (HOSPITAL_BASED_OUTPATIENT_CLINIC_OR_DEPARTMENT_OTHER): Payer: Medicare Other | Admitting: Oncology

## 2015-09-14 ENCOUNTER — Inpatient Hospital Stay: Payer: Medicare Other

## 2015-09-14 VITALS — BP 133/68 | HR 75 | Temp 98.1°F | Resp 16 | Wt 119.7 lb

## 2015-09-14 DIAGNOSIS — Z79899 Other long term (current) drug therapy: Secondary | ICD-10-CM | POA: Diagnosis not present

## 2015-09-14 DIAGNOSIS — R5383 Other fatigue: Secondary | ICD-10-CM | POA: Diagnosis not present

## 2015-09-14 DIAGNOSIS — Z7982 Long term (current) use of aspirin: Secondary | ICD-10-CM | POA: Diagnosis not present

## 2015-09-14 DIAGNOSIS — Z87440 Personal history of urinary (tract) infections: Secondary | ICD-10-CM

## 2015-09-14 DIAGNOSIS — D638 Anemia in other chronic diseases classified elsewhere: Secondary | ICD-10-CM

## 2015-09-14 DIAGNOSIS — Z801 Family history of malignant neoplasm of trachea, bronchus and lung: Secondary | ICD-10-CM

## 2015-09-14 DIAGNOSIS — R531 Weakness: Secondary | ICD-10-CM | POA: Diagnosis not present

## 2015-09-14 DIAGNOSIS — Z853 Personal history of malignant neoplasm of breast: Secondary | ICD-10-CM

## 2015-09-14 DIAGNOSIS — I10 Essential (primary) hypertension: Secondary | ICD-10-CM | POA: Diagnosis not present

## 2015-09-14 DIAGNOSIS — K449 Diaphragmatic hernia without obstruction or gangrene: Secondary | ICD-10-CM

## 2015-09-14 LAB — HEMOGLOBIN: HEMOGLOBIN: 8.8 g/dL — AB (ref 12.0–16.0)

## 2015-09-14 NOTE — Progress Notes (Signed)
Patient is feeling fatigued. 

## 2015-09-24 NOTE — Progress Notes (Signed)
[place]  [phone] [phone]  ID: Chelsea Horne OB: September 05, 1927  MR#: [id number]  XIP#:[id number]  Patient Care Team: Chelsea Athens, MD as PCP - General (Internal Medicine)  CHIEF COMPLAINT:  Chief Complaint  Patient presents with  . Anemia    INTERVAL HISTORY: Patient returns to clinic today for laboratory work and consideration of additional Procrit.  She continues to have chronic weakness and fatigue, but otherwise feels well.  She denies any recent fevers, chills, night sweats, or weight loss.  She has no chest pain or shortness of breath.  She has a good appetite and denies any nausea, vomiting, constipation, or diarrhea.  She denies any melena or hematochezia.  She has no urinary complaints.  Patient offers no further specific complaints today.   REVIEW OF SYSTEMS:   Review of Systems  Constitutional: Positive for malaise/fatigue.  Respiratory: Negative.   Cardiovascular: Negative.   Gastrointestinal: Negative for blood in stool and melena.  Musculoskeletal: Negative.   Neurological: Negative.   Psychiatric/Behavioral: Positive for memory loss.    As per HPI. Otherwise, a complete review of systems is negatve.  PAST MEDICAL HISTORY: Past Medical History  Diagnosis Date  . Hypertension   . Breast cancer   . Hiatal hernia   . UTI (lower urinary tract infection)     PAST SURGICAL HISTORY: Past Surgical History  Procedure Laterality Date  . Abdominal hysterectomy    . Mastectomy      modified radical in August 1996  . Foot surgery      for hammertoe    FAMILY HISTORY Family History  Problem Relation Age of Onset  . Cancer Brother     lung  . Hypertension Brother   . Anemia Brother   . Myelodysplastic syndrome Brother        ADVANCED DIRECTIVES:    HEALTH MAINTENANCE: Social History  Substance Use Topics  . Smoking status: Never Smoker   . Smokeless tobacco: Never Used  . Alcohol Use: No      Colonoscopy:  PAP:  Bone density:  Lipid panel:  Allergies  Allergen Reactions  . Nitrofurantoin Other (See Comments)  . Penicillins Itching  . Sulfa Antibiotics Itching    Current Outpatient Prescriptions  Medication Sig Dispense Refill  . acetaminophen (RA ACETAMINOPHEN) 650 MG CR tablet Take by mouth.    Marland Kitchen alendronate (FOSAMAX) 70 MG tablet   0  . Ascorbic Acid (VITAMIN C) 1000 MG tablet Take 1,000 mg by mouth daily.    Marland Kitchen aspirin 81 MG tablet Take 81 mg by mouth daily.    . calcium-vitamin D (OSCAL WITH D) 250-125 MG-UNIT per tablet Take 1 tablet by mouth daily.    Marland Kitchen gabapentin (NEURONTIN) 100 MG capsule Take 100 mg by mouth 3 (three) times daily.    . psyllium (METAMUCIL) 58.6 % packet Take 1 packet by mouth daily.    . traMADol (ULTRAM) 50 MG tablet Take 50 mg by mouth 2 (two) times daily.    . valsartan (DIOVAN) 40 MG tablet Take 40 mg by mouth daily.     No current facility-administered medications for this visit.    OBJECTIVE: Filed Vitals:   09/14/15 1454  BP: 133/68  Pulse: 75  Temp: 98.1 F (36.7 C)  Resp: 16     Body mass index is 24.04 kg/(m^2).    ECOG FS:1 - Symptomatic but completely ambulatory  General: Well-developed, well-nourished, no acute distress. Eyes: Pink conjunctiva, anicteric sclera. Lungs: Clear to auscultation  bilaterally. Heart: Regular rate and rhythm. No rubs, murmurs, or gallops. Abdomen: Soft, nontender, nondistended. No organomegaly noted, normoactive bowel sounds. Musculoskeletal: No edema, cyanosis, or clubbing. Neuro: Alert, answering all questions appropriately. Cranial nerves grossly intact. Skin: No rashes or petechiae noted. Psych: Normal affect.  LAB RESULTS:  Lab Results  Component Value Date   NA 129* 06/10/2014   K 4.3 06/10/2014   CL 96* 06/10/2014   CO2 23 06/10/2014   GLUCOSE 140* 06/10/2014   BUN 22* 06/10/2014   CREATININE 1.24 06/10/2014   CALCIUM 10.3* 06/10/2014   PROT 7.1 06/10/2014   ALBUMIN  2.3* 06/10/2014   AST 11* 06/10/2014   ALT 13* 06/10/2014   ALKPHOS 70 06/10/2014   BILITOT 0.4 06/10/2014   GFRNONAA 40* 06/10/2014   GFRAA 46* 06/10/2014    Lab Results  Component Value Date   WBC 2.6* 05/10/2015   NEUTROABS 0.9* 03/01/2015   HGB 8.8* 09/14/2015   HCT 25.7* 05/10/2015   MCV 95.6 05/10/2015   PLT 194 05/10/2015     STUDIES: No results found.  ASSESSMENT:  Anemia of chronic disease.  PLAN:    1. Anemia:  Patient's hemoglobin continues to be decreased and less than 10.0 at 8.8 today. Proceed with 40,000 units subcutaneous Procrit today.  Her most recent iron stores from March 01, 2015 are within normal limits. Return to clinic every 3 weeks for laboratory work and consideration of Procrit. Patient will then return to clinic in 4 months with repeat laboratory work and further evaluation.  2.  History of breast cancer: No evidence of disease.  Patient's mammogram's are ordered by her PCP. 3.  Hypertension: Treatment per PCP.  Patient expressed understanding and was in agreement with this plan. She also understands that She can call clinic at any time with any questions, concerns, or complaints.    [name], MD   09/24/2015 8:26 AM

## 2015-09-28 DIAGNOSIS — M25511 Pain in right shoulder: Secondary | ICD-10-CM | POA: Diagnosis not present

## 2015-09-28 DIAGNOSIS — Z23 Encounter for immunization: Secondary | ICD-10-CM | POA: Diagnosis not present

## 2015-09-28 DIAGNOSIS — M5126 Other intervertebral disc displacement, lumbar region: Secondary | ICD-10-CM | POA: Diagnosis not present

## 2015-09-28 DIAGNOSIS — H938X9 Other specified disorders of ear, unspecified ear: Secondary | ICD-10-CM | POA: Diagnosis not present

## 2015-09-28 DIAGNOSIS — M5431 Sciatica, right side: Secondary | ICD-10-CM | POA: Diagnosis not present

## 2015-10-05 ENCOUNTER — Inpatient Hospital Stay: Payer: Medicare Other

## 2015-10-05 ENCOUNTER — Inpatient Hospital Stay: Payer: Medicare Other | Attending: Oncology

## 2015-10-05 VITALS — BP 151/65 | HR 80

## 2015-10-05 DIAGNOSIS — Z79899 Other long term (current) drug therapy: Secondary | ICD-10-CM | POA: Insufficient documentation

## 2015-10-05 DIAGNOSIS — D638 Anemia in other chronic diseases classified elsewhere: Secondary | ICD-10-CM | POA: Diagnosis not present

## 2015-10-05 DIAGNOSIS — I10 Essential (primary) hypertension: Secondary | ICD-10-CM | POA: Insufficient documentation

## 2015-10-05 LAB — HEMOGLOBIN: Hemoglobin: 7.8 g/dL — ABNORMAL LOW (ref 12.0–16.0)

## 2015-10-05 MED ORDER — EPOETIN ALFA 40000 UNIT/ML IJ SOLN
40000.0000 [IU] | Freq: Once | INTRAMUSCULAR | Status: AC
  Administered 2015-10-05: 40000 [IU] via SUBCUTANEOUS
  Filled 2015-10-05: qty 1

## 2015-10-06 ENCOUNTER — Telehealth: Payer: Self-pay | Admitting: *Deleted

## 2015-10-06 DIAGNOSIS — D638 Anemia in other chronic diseases classified elsewhere: Secondary | ICD-10-CM

## 2015-10-06 NOTE — Telephone Encounter (Signed)
Dr. Grayland Ormond states he will need to review her chart before deciding on plan for patient.

## 2015-10-06 NOTE — Telephone Encounter (Signed)
Requesting Dr Virgel Manifold nurse call her regarding her mothers appt yesterday and the fact that her HGB was down to 7.8. Inquiring about a blood transfusion

## 2015-10-06 NOTE — Telephone Encounter (Signed)
Labs entered in computer message to schedulers sent and I called Hoyle Sauer to let her know to expect a call with an appt after Thanksgiving

## 2015-10-06 NOTE — Telephone Encounter (Signed)
Have patient f/u with me after thanksgiving with cbc, ferritin, iron&IBC,  and extra tube.  If she continue to trend down, we can consider a transfusion at that point.

## 2015-10-10 DIAGNOSIS — M25511 Pain in right shoulder: Secondary | ICD-10-CM | POA: Diagnosis not present

## 2015-10-10 DIAGNOSIS — H6120 Impacted cerumen, unspecified ear: Secondary | ICD-10-CM | POA: Diagnosis not present

## 2015-10-10 DIAGNOSIS — M5431 Sciatica, right side: Secondary | ICD-10-CM | POA: Diagnosis not present

## 2015-10-10 DIAGNOSIS — H903 Sensorineural hearing loss, bilateral: Secondary | ICD-10-CM | POA: Diagnosis not present

## 2015-10-10 DIAGNOSIS — H6123 Impacted cerumen, bilateral: Secondary | ICD-10-CM | POA: Diagnosis not present

## 2015-10-10 DIAGNOSIS — R5383 Other fatigue: Secondary | ICD-10-CM | POA: Diagnosis not present

## 2015-10-17 ENCOUNTER — Inpatient Hospital Stay: Payer: Medicare Other

## 2015-10-17 ENCOUNTER — Inpatient Hospital Stay: Payer: Medicare Other | Admitting: Oncology

## 2015-10-26 ENCOUNTER — Inpatient Hospital Stay: Payer: Medicare Other | Attending: Oncology

## 2015-10-26 ENCOUNTER — Inpatient Hospital Stay: Payer: Medicare Other

## 2015-10-26 VITALS — BP 129/54 | HR 68

## 2015-10-26 DIAGNOSIS — D638 Anemia in other chronic diseases classified elsewhere: Secondary | ICD-10-CM

## 2015-10-26 DIAGNOSIS — I10 Essential (primary) hypertension: Secondary | ICD-10-CM | POA: Diagnosis not present

## 2015-10-26 DIAGNOSIS — Z79899 Other long term (current) drug therapy: Secondary | ICD-10-CM | POA: Diagnosis not present

## 2015-10-26 LAB — HEMOGLOBIN: Hemoglobin: 8.4 g/dL — ABNORMAL LOW (ref 12.0–16.0)

## 2015-10-26 MED ORDER — EPOETIN ALFA 40000 UNIT/ML IJ SOLN
40000.0000 [IU] | Freq: Once | INTRAMUSCULAR | Status: AC
  Administered 2015-10-26: 40000 [IU] via SUBCUTANEOUS
  Filled 2015-10-26: qty 1

## 2015-11-16 ENCOUNTER — Inpatient Hospital Stay: Payer: Medicare Other

## 2015-11-16 VITALS — BP 131/73 | HR 80 | Resp 20

## 2015-11-16 DIAGNOSIS — Z79899 Other long term (current) drug therapy: Secondary | ICD-10-CM | POA: Diagnosis not present

## 2015-11-16 DIAGNOSIS — D638 Anemia in other chronic diseases classified elsewhere: Secondary | ICD-10-CM

## 2015-11-16 DIAGNOSIS — I10 Essential (primary) hypertension: Secondary | ICD-10-CM | POA: Diagnosis not present

## 2015-11-16 LAB — HEMOGLOBIN: HEMOGLOBIN: 7.9 g/dL — AB (ref 12.0–16.0)

## 2015-11-16 MED ORDER — EPOETIN ALFA 40000 UNIT/ML IJ SOLN
40000.0000 [IU] | Freq: Once | INTRAMUSCULAR | Status: AC
  Administered 2015-11-16: 40000 [IU] via SUBCUTANEOUS
  Filled 2015-11-16: qty 1

## 2015-12-07 ENCOUNTER — Inpatient Hospital Stay: Payer: Medicare Other | Attending: Oncology

## 2015-12-07 ENCOUNTER — Inpatient Hospital Stay: Payer: Medicare Other

## 2015-12-07 VITALS — BP 135/71 | HR 93 | Temp 98.0°F | Resp 20

## 2015-12-07 DIAGNOSIS — D638 Anemia in other chronic diseases classified elsewhere: Secondary | ICD-10-CM | POA: Diagnosis not present

## 2015-12-07 DIAGNOSIS — I10 Essential (primary) hypertension: Secondary | ICD-10-CM | POA: Diagnosis not present

## 2015-12-07 LAB — CBC WITH DIFFERENTIAL/PLATELET
BASOS ABS: 0 10*3/uL (ref 0–0.1)
BASOS PCT: 0 %
EOS PCT: 0 %
Eosinophils Absolute: 0 10*3/uL (ref 0–0.7)
HCT: 23.2 % — ABNORMAL LOW (ref 35.0–47.0)
Hemoglobin: 8 g/dL — ABNORMAL LOW (ref 12.0–16.0)
Lymphocytes Relative: 49 %
Lymphs Abs: 1.4 10*3/uL (ref 1.0–3.6)
MCH: 33.1 pg (ref 26.0–34.0)
MCHC: 34.5 g/dL (ref 32.0–36.0)
MCV: 95.9 fL (ref 80.0–100.0)
MONO ABS: 0.1 10*3/uL — AB (ref 0.2–0.9)
Monocytes Relative: 3 %
Neutro Abs: 1.4 10*3/uL (ref 1.4–6.5)
Neutrophils Relative %: 48 %
PLATELETS: 149 10*3/uL — AB (ref 150–440)
RBC: 2.42 MIL/uL — ABNORMAL LOW (ref 3.80–5.20)
RDW: 19.3 % — AB (ref 11.5–14.5)
WBC: 2.9 10*3/uL — ABNORMAL LOW (ref 3.6–11.0)

## 2015-12-07 LAB — SAMPLE TO BLOOD BANK

## 2015-12-07 LAB — IRON AND TIBC
Iron: 75 ug/dL (ref 28–170)
SATURATION RATIOS: 32 % — AB (ref 10.4–31.8)
TIBC: 234 ug/dL — ABNORMAL LOW (ref 250–450)
UIBC: 159 ug/dL

## 2015-12-07 LAB — FERRITIN: Ferritin: 337 ng/mL — ABNORMAL HIGH (ref 11–307)

## 2015-12-07 MED ORDER — EPOETIN ALFA 40000 UNIT/ML IJ SOLN
40000.0000 [IU] | Freq: Once | INTRAMUSCULAR | Status: AC
  Administered 2015-12-07: 40000 [IU] via SUBCUTANEOUS
  Filled 2015-12-07: qty 1

## 2015-12-22 DIAGNOSIS — Z85828 Personal history of other malignant neoplasm of skin: Secondary | ICD-10-CM | POA: Diagnosis not present

## 2015-12-22 DIAGNOSIS — L570 Actinic keratosis: Secondary | ICD-10-CM | POA: Diagnosis not present

## 2015-12-22 DIAGNOSIS — L720 Epidermal cyst: Secondary | ICD-10-CM | POA: Diagnosis not present

## 2015-12-22 DIAGNOSIS — D485 Neoplasm of uncertain behavior of skin: Secondary | ICD-10-CM | POA: Diagnosis not present

## 2015-12-28 ENCOUNTER — Inpatient Hospital Stay: Payer: Medicare Other

## 2015-12-28 ENCOUNTER — Inpatient Hospital Stay: Payer: Medicare Other | Attending: Oncology

## 2015-12-28 VITALS — BP 138/78 | HR 89 | Resp 20

## 2015-12-28 DIAGNOSIS — I10 Essential (primary) hypertension: Secondary | ICD-10-CM | POA: Insufficient documentation

## 2015-12-28 DIAGNOSIS — Z79899 Other long term (current) drug therapy: Secondary | ICD-10-CM | POA: Insufficient documentation

## 2015-12-28 DIAGNOSIS — D638 Anemia in other chronic diseases classified elsewhere: Secondary | ICD-10-CM

## 2015-12-28 LAB — HEMOGLOBIN: HEMOGLOBIN: 8 g/dL — AB (ref 12.0–16.0)

## 2015-12-28 MED ORDER — EPOETIN ALFA 40000 UNIT/ML IJ SOLN
40000.0000 [IU] | Freq: Once | INTRAMUSCULAR | Status: AC
  Administered 2015-12-28: 40000 [IU] via SUBCUTANEOUS
  Filled 2015-12-28: qty 1

## 2015-12-29 DIAGNOSIS — D469 Myelodysplastic syndrome, unspecified: Secondary | ICD-10-CM | POA: Diagnosis not present

## 2015-12-29 DIAGNOSIS — N184 Chronic kidney disease, stage 4 (severe): Secondary | ICD-10-CM | POA: Diagnosis not present

## 2015-12-29 DIAGNOSIS — M5093 Cervical disc disorder, unspecified, cervicothoracic region: Secondary | ICD-10-CM | POA: Diagnosis not present

## 2015-12-29 DIAGNOSIS — F418 Other specified anxiety disorders: Secondary | ICD-10-CM | POA: Diagnosis not present

## 2016-01-18 ENCOUNTER — Ambulatory Visit: Payer: Self-pay | Admitting: Oncology

## 2016-01-18 ENCOUNTER — Other Ambulatory Visit: Payer: Self-pay

## 2016-01-18 ENCOUNTER — Ambulatory Visit: Payer: Self-pay

## 2016-01-25 ENCOUNTER — Inpatient Hospital Stay: Payer: Medicare Other | Attending: Oncology

## 2016-01-25 ENCOUNTER — Inpatient Hospital Stay: Payer: Medicare Other

## 2016-01-25 ENCOUNTER — Inpatient Hospital Stay (HOSPITAL_BASED_OUTPATIENT_CLINIC_OR_DEPARTMENT_OTHER): Payer: Medicare Other | Admitting: Oncology

## 2016-01-25 VITALS — BP 132/68 | HR 68 | Temp 97.3°F | Resp 16 | Wt 117.3 lb

## 2016-01-25 DIAGNOSIS — R413 Other amnesia: Secondary | ICD-10-CM

## 2016-01-25 DIAGNOSIS — D638 Anemia in other chronic diseases classified elsewhere: Secondary | ICD-10-CM

## 2016-01-25 DIAGNOSIS — Z9010 Acquired absence of unspecified breast and nipple: Secondary | ICD-10-CM | POA: Diagnosis not present

## 2016-01-25 DIAGNOSIS — I10 Essential (primary) hypertension: Secondary | ICD-10-CM | POA: Diagnosis not present

## 2016-01-25 DIAGNOSIS — Z7982 Long term (current) use of aspirin: Secondary | ICD-10-CM | POA: Insufficient documentation

## 2016-01-25 DIAGNOSIS — Z853 Personal history of malignant neoplasm of breast: Secondary | ICD-10-CM | POA: Diagnosis not present

## 2016-01-25 DIAGNOSIS — Z79899 Other long term (current) drug therapy: Secondary | ICD-10-CM | POA: Diagnosis not present

## 2016-01-25 DIAGNOSIS — Z801 Family history of malignant neoplasm of trachea, bronchus and lung: Secondary | ICD-10-CM | POA: Diagnosis not present

## 2016-01-25 LAB — HEMOGLOBIN: Hemoglobin: 8.6 g/dL — ABNORMAL LOW (ref 12.0–16.0)

## 2016-01-25 MED ORDER — EPOETIN ALFA 40000 UNIT/ML IJ SOLN
40000.0000 [IU] | Freq: Once | INTRAMUSCULAR | Status: AC
  Administered 2016-01-25: 40000 [IU] via SUBCUTANEOUS
  Filled 2016-01-25: qty 1

## 2016-01-25 NOTE — Progress Notes (Signed)
Patient does not offer any problems today.  

## 2016-02-03 NOTE — Progress Notes (Signed)
[place]  Telephone:(336) [phone] Fax:(336) [phone]  ID: Chelsea Horne OB: Dec 09, 1926  MR#: [id number]  [id number]  Patient Care Team: Cletis Athens, MD as PCP - General (Internal Medicine)  CHIEF COMPLAINT:  Chief Complaint  Patient presents with  . Anemia    INTERVAL HISTORY: Patient returns to clinic today for laboratory work and consideration of additional Procrit.  She currently feels well and is asymptomatic. She does not complain of weakness or fatigue today. She has no neurologic complaint. She denies any recent fevers, chills, night sweats, or weight loss.  She has no chest pain or shortness of breath.  She has a good appetite and denies any nausea, vomiting, constipation, or diarrhea.  She denies any melena or hematochezia.  She has no urinary complaints.  Patient offers no specific complaints today.   REVIEW OF SYSTEMS:   Review of Systems  Constitutional: Negative.  Negative for fever, weight loss and malaise/fatigue.  Respiratory: Negative.  Negative for shortness of breath.   Cardiovascular: Negative.  Negative for chest pain.  Gastrointestinal: Negative for blood in stool and melena.  Musculoskeletal: Negative.   Neurological: Negative.  Negative for weakness.  Psychiatric/Behavioral: Positive for memory loss.    As per HPI. Otherwise, a complete review of systems is negatve.  PAST MEDICAL HISTORY: Past Medical History  Diagnosis Date  . Hypertension   . Breast cancer   . Hiatal hernia   . UTI (lower urinary tract infection)     PAST SURGICAL HISTORY: Past Surgical History  Procedure Laterality Date  . Abdominal hysterectomy    . Mastectomy      modified radical in August 1996  . Foot surgery      for hammertoe    FAMILY HISTORY Family History  Problem Relation Age of Onset  . Cancer Brother     lung  . Hypertension Brother   . Anemia Brother   . Myelodysplastic syndrome Brother        ADVANCED DIRECTIVES:     HEALTH MAINTENANCE: Social History  Substance Use Topics  . Smoking status: Never Smoker   . Smokeless tobacco: Never Used  . Alcohol Use: No     Colonoscopy:  PAP:  Bone density:  Lipid panel:  Allergies  Allergen Reactions  . Nitrofurantoin Other (See Comments)  . Penicillins Itching  . Sulfa Antibiotics Itching    Current Outpatient Prescriptions  Medication Sig Dispense Refill  . acetaminophen (RA ACETAMINOPHEN) 650 MG CR tablet Take by mouth.    Marland Kitchen alendronate (FOSAMAX) 70 MG tablet   0  . Ascorbic Acid (VITAMIN C) 1000 MG tablet Take 1,000 mg by mouth daily.    Marland Kitchen aspirin 81 MG tablet Take 81 mg by mouth daily.    . calcium-vitamin D (OSCAL WITH D) 250-125 MG-UNIT per tablet Take 1 tablet by mouth daily.    . psyllium (METAMUCIL) 58.6 % packet Take 1 packet by mouth daily.    . valsartan (DIOVAN) 40 MG tablet Take 40 mg by mouth daily.     No current facility-administered medications for this visit.    OBJECTIVE: Filed Vitals:   01/25/16 1143  BP: 132/68  Pulse: 68  Temp: 97.3 F (36.3 C)  Resp: 16     Body mass index is 23.55 kg/(m^2).    ECOG FS:1 - Symptomatic but completely ambulatory  General: Well-developed, well-nourished, no acute distress. Eyes: Pink conjunctiva, anicteric sclera. Lungs: Clear to auscultation bilaterally. Heart: Regular rate and rhythm. No rubs, murmurs, or  gallops. Abdomen: Soft, nontender, nondistended. No organomegaly noted, normoactive bowel sounds. Musculoskeletal: No edema, cyanosis, or clubbing. Neuro: Alert, answering all questions appropriately. Cranial nerves grossly intact. Skin: No rashes or petechiae noted. Psych: Normal affect.  LAB RESULTS:  Lab Results  Component Value Date   NA 129* 06/10/2014   K 4.3 06/10/2014   CL 96* 06/10/2014   CO2 23 06/10/2014   GLUCOSE 140* 06/10/2014   BUN 22* 06/10/2014   CREATININE 1.24 06/10/2014   CALCIUM 10.3* 06/10/2014   PROT 7.1 06/10/2014   ALBUMIN 2.3* 06/10/2014    AST 11* 06/10/2014   ALT 13* 06/10/2014   ALKPHOS 70 06/10/2014   BILITOT 0.4 06/10/2014   GFRNONAA 40* 06/10/2014   GFRAA 46* 06/10/2014    Lab Results  Component Value Date   WBC 2.9* 12/07/2015   NEUTROABS 1.4 12/07/2015   HGB 8.6* 01/25/2016   HCT 23.2* 12/07/2015   MCV 95.9 12/07/2015   PLT 149* 12/07/2015   Lab Results  Component Value Date   FERRITIN 337* 12/07/2015   Lab Results  Component Value Date   IRON 75 12/07/2015   TIBC 234* 12/07/2015   IRONPCTSAT 32* 12/07/2015      STUDIES: No results found.  ASSESSMENT:  Anemia of chronic disease.  PLAN:    1. Anemia:  Patient's hemoglobin continues to be decreased and less than 10.0 at 8.6 today. Proceed with 40,000 units subcutaneous Procrit today.  Her most recent iron stores as above are adequate to proceed, but she may require IV iron in the future if there is a trend down. Proceed, the remainder of her laboratory work is either negative or within normal limits. Return to clinic every 4 weeks for laboratory work and consideration of Procrit. Patient will then return to clinic in 4 months with repeat laboratory work and further evaluation.  2.  History of breast cancer: No evidence of disease.  Patient's mammogram's are ordered by her PCP. 3.  Hypertension: Blood pressure nearly within normal limits today. Continue treatment per PCP.  Patient expressed understanding and was in agreement with this plan. She also understands that She can call clinic at any time with any questions, concerns, or complaints.    [name], MD   02/03/2016 11:09 AM

## 2016-02-22 ENCOUNTER — Inpatient Hospital Stay: Payer: Medicare Other

## 2016-02-22 ENCOUNTER — Inpatient Hospital Stay: Payer: Medicare Other | Attending: Oncology

## 2016-02-22 VITALS — BP 126/68 | HR 69 | Temp 97.0°F | Resp 20

## 2016-02-22 DIAGNOSIS — D638 Anemia in other chronic diseases classified elsewhere: Secondary | ICD-10-CM

## 2016-02-22 DIAGNOSIS — Z79899 Other long term (current) drug therapy: Secondary | ICD-10-CM | POA: Insufficient documentation

## 2016-02-22 DIAGNOSIS — I10 Essential (primary) hypertension: Secondary | ICD-10-CM | POA: Insufficient documentation

## 2016-02-22 LAB — HEMOGLOBIN: Hemoglobin: 7.6 g/dL — ABNORMAL LOW (ref 12.0–16.0)

## 2016-02-22 MED ORDER — EPOETIN ALFA 40000 UNIT/ML IJ SOLN
40000.0000 [IU] | Freq: Once | INTRAMUSCULAR | Status: AC
  Administered 2016-02-22: 40000 [IU] via SUBCUTANEOUS
  Filled 2016-02-22: qty 1

## 2016-03-06 DIAGNOSIS — N184 Chronic kidney disease, stage 4 (severe): Secondary | ICD-10-CM | POA: Diagnosis not present

## 2016-03-06 DIAGNOSIS — D469 Myelodysplastic syndrome, unspecified: Secondary | ICD-10-CM | POA: Diagnosis not present

## 2016-03-06 DIAGNOSIS — M7550 Bursitis of unspecified shoulder: Secondary | ICD-10-CM | POA: Diagnosis not present

## 2016-03-06 DIAGNOSIS — J219 Acute bronchiolitis, unspecified: Secondary | ICD-10-CM | POA: Diagnosis not present

## 2016-03-13 DIAGNOSIS — D469 Myelodysplastic syndrome, unspecified: Secondary | ICD-10-CM | POA: Diagnosis not present

## 2016-03-13 DIAGNOSIS — F418 Other specified anxiety disorders: Secondary | ICD-10-CM | POA: Diagnosis not present

## 2016-03-13 DIAGNOSIS — M1712 Unilateral primary osteoarthritis, left knee: Secondary | ICD-10-CM | POA: Diagnosis not present

## 2016-03-13 DIAGNOSIS — M5093 Cervical disc disorder, unspecified, cervicothoracic region: Secondary | ICD-10-CM | POA: Diagnosis not present

## 2016-03-21 ENCOUNTER — Inpatient Hospital Stay: Payer: Medicare Other

## 2016-03-21 ENCOUNTER — Inpatient Hospital Stay: Payer: Medicare Other | Attending: Oncology

## 2016-03-21 DIAGNOSIS — D638 Anemia in other chronic diseases classified elsewhere: Secondary | ICD-10-CM | POA: Insufficient documentation

## 2016-03-21 DIAGNOSIS — Z79899 Other long term (current) drug therapy: Secondary | ICD-10-CM | POA: Insufficient documentation

## 2016-03-21 DIAGNOSIS — I10 Essential (primary) hypertension: Secondary | ICD-10-CM | POA: Insufficient documentation

## 2016-03-26 ENCOUNTER — Inpatient Hospital Stay: Payer: Medicare Other

## 2016-03-26 VITALS — BP 125/65 | HR 71 | Resp 20

## 2016-03-26 DIAGNOSIS — Z79899 Other long term (current) drug therapy: Secondary | ICD-10-CM | POA: Diagnosis not present

## 2016-03-26 DIAGNOSIS — D638 Anemia in other chronic diseases classified elsewhere: Secondary | ICD-10-CM

## 2016-03-26 DIAGNOSIS — I10 Essential (primary) hypertension: Secondary | ICD-10-CM | POA: Diagnosis not present

## 2016-03-26 LAB — HEMOGLOBIN: HEMOGLOBIN: 8.1 g/dL — AB (ref 12.0–16.0)

## 2016-03-26 MED ORDER — EPOETIN ALFA 40000 UNIT/ML IJ SOLN
40000.0000 [IU] | Freq: Once | INTRAMUSCULAR | Status: AC
  Administered 2016-03-26: 40000 [IU] via SUBCUTANEOUS
  Filled 2016-03-26: qty 1

## 2016-04-11 DIAGNOSIS — M25511 Pain in right shoulder: Secondary | ICD-10-CM | POA: Diagnosis not present

## 2016-04-11 DIAGNOSIS — M19011 Primary osteoarthritis, right shoulder: Secondary | ICD-10-CM | POA: Diagnosis not present

## 2016-04-18 ENCOUNTER — Inpatient Hospital Stay: Payer: Medicare Other | Attending: Oncology

## 2016-04-18 ENCOUNTER — Inpatient Hospital Stay: Payer: Medicare Other

## 2016-04-18 VITALS — BP 133/64 | HR 77 | Temp 97.0°F | Resp 20

## 2016-04-18 DIAGNOSIS — I10 Essential (primary) hypertension: Secondary | ICD-10-CM | POA: Insufficient documentation

## 2016-04-18 DIAGNOSIS — D638 Anemia in other chronic diseases classified elsewhere: Secondary | ICD-10-CM | POA: Insufficient documentation

## 2016-04-18 LAB — HEMOGLOBIN: Hemoglobin: 7.5 g/dL — ABNORMAL LOW (ref 12.0–16.0)

## 2016-04-18 MED ORDER — EPOETIN ALFA 40000 UNIT/ML IJ SOLN
40000.0000 [IU] | Freq: Once | INTRAMUSCULAR | Status: AC
  Administered 2016-04-18: 40000 [IU] via SUBCUTANEOUS
  Filled 2016-04-18: qty 1

## 2016-05-03 DIAGNOSIS — M75121 Complete rotator cuff tear or rupture of right shoulder, not specified as traumatic: Secondary | ICD-10-CM | POA: Diagnosis not present

## 2016-05-03 DIAGNOSIS — M12811 Other specific arthropathies, not elsewhere classified, right shoulder: Secondary | ICD-10-CM | POA: Insufficient documentation

## 2016-05-23 ENCOUNTER — Inpatient Hospital Stay: Payer: Medicare Other

## 2016-05-23 ENCOUNTER — Inpatient Hospital Stay: Payer: Medicare Other | Attending: Oncology

## 2016-05-23 ENCOUNTER — Inpatient Hospital Stay (HOSPITAL_BASED_OUTPATIENT_CLINIC_OR_DEPARTMENT_OTHER): Payer: Medicare Other | Admitting: Oncology

## 2016-05-23 VITALS — BP 154/66 | HR 80 | Temp 96.2°F | Resp 18 | Wt 116.2 lb

## 2016-05-23 DIAGNOSIS — Z7982 Long term (current) use of aspirin: Secondary | ICD-10-CM | POA: Insufficient documentation

## 2016-05-23 DIAGNOSIS — Z801 Family history of malignant neoplasm of trachea, bronchus and lung: Secondary | ICD-10-CM | POA: Insufficient documentation

## 2016-05-23 DIAGNOSIS — Z9010 Acquired absence of unspecified breast and nipple: Secondary | ICD-10-CM | POA: Diagnosis not present

## 2016-05-23 DIAGNOSIS — Z79899 Other long term (current) drug therapy: Secondary | ICD-10-CM | POA: Insufficient documentation

## 2016-05-23 DIAGNOSIS — Z87440 Personal history of urinary (tract) infections: Secondary | ICD-10-CM

## 2016-05-23 DIAGNOSIS — I10 Essential (primary) hypertension: Secondary | ICD-10-CM | POA: Diagnosis not present

## 2016-05-23 DIAGNOSIS — D638 Anemia in other chronic diseases classified elsewhere: Secondary | ICD-10-CM

## 2016-05-23 DIAGNOSIS — K449 Diaphragmatic hernia without obstruction or gangrene: Secondary | ICD-10-CM | POA: Insufficient documentation

## 2016-05-23 DIAGNOSIS — Z803 Family history of malignant neoplasm of breast: Secondary | ICD-10-CM | POA: Diagnosis not present

## 2016-05-23 LAB — CBC WITH DIFFERENTIAL/PLATELET
BASOS PCT: 1 %
Basophils Absolute: 0 10*3/uL (ref 0–0.1)
Eosinophils Absolute: 0 10*3/uL (ref 0–0.7)
Eosinophils Relative: 0 %
HEMATOCRIT: 21.6 % — AB (ref 35.0–47.0)
Hemoglobin: 7.6 g/dL — ABNORMAL LOW (ref 12.0–16.0)
Lymphocytes Relative: 59 %
Lymphs Abs: 1.8 10*3/uL (ref 1.0–3.6)
MCH: 34.6 pg — ABNORMAL HIGH (ref 26.0–34.0)
MCHC: 35.1 g/dL (ref 32.0–36.0)
MCV: 98.4 fL (ref 80.0–100.0)
MONO ABS: 0.2 10*3/uL (ref 0.2–0.9)
MONOS PCT: 8 %
NEUTROS ABS: 1 10*3/uL — AB (ref 1.4–6.5)
Neutrophils Relative %: 32 %
Platelets: 190 10*3/uL (ref 150–440)
RBC: 2.19 MIL/uL — ABNORMAL LOW (ref 3.80–5.20)
RDW: 19.6 % — AB (ref 11.5–14.5)
WBC: 3 10*3/uL — ABNORMAL LOW (ref 3.6–11.0)

## 2016-05-23 LAB — IRON AND TIBC
IRON: 100 ug/dL (ref 28–170)
Saturation Ratios: 42 % — ABNORMAL HIGH (ref 10.4–31.8)
TIBC: 241 ug/dL — ABNORMAL LOW (ref 250–450)
UIBC: 141 ug/dL

## 2016-05-23 LAB — FERRITIN: Ferritin: 302 ng/mL (ref 11–307)

## 2016-05-23 MED ORDER — EPOETIN ALFA 40000 UNIT/ML IJ SOLN
40000.0000 [IU] | Freq: Once | INTRAMUSCULAR | Status: AC
  Administered 2016-05-23: 40000 [IU] via SUBCUTANEOUS
  Filled 2016-05-23: qty 1

## 2016-05-23 NOTE — Progress Notes (Signed)
[place]  Telephone:(336) [phone] Fax:(336) [phone]  ID: Chelsea Horne OB: Mar 03, 1927  MR#: [id number]  [id number]  Patient Care Team: Cletis Athens, MD as PCP - General (Internal Medicine)  CHIEF COMPLAINT:  Chief Complaint  Patient presents with  . Anemia    INTERVAL HISTORY: Patient returns to clinic today for laboratory work and consideration of additional Procrit.  She currently feels well and is asymptomatic. She does not complain of weakness or fatigue today, Although she states she feels "lazy". She has no neurologic complaints. She denies any recent fevers, chills, night sweats, or weight loss.  She has no chest pain or shortness of breath.  She has a good appetite and denies any nausea, vomiting, constipation, or diarrhea.  She denies any melena or hematochezia.  She has no urinary complaints.  Patient offers no specific complaints today.   REVIEW OF SYSTEMS:   Review of Systems  Constitutional: Negative.  Negative for fever, weight loss and malaise/fatigue.  Respiratory: Negative.  Negative for shortness of breath.   Cardiovascular: Negative.  Negative for chest pain.  Gastrointestinal: Negative for blood in stool and melena.  Musculoskeletal: Negative.   Neurological: Negative.  Negative for weakness.  Psychiatric/Behavioral: Positive for memory loss.    As per HPI. Otherwise, a complete review of systems is negatve.  PAST MEDICAL HISTORY: Past Medical History  Diagnosis Date  . Hypertension   . Breast cancer   . Hiatal hernia   . UTI (lower urinary tract infection)     PAST SURGICAL HISTORY: Past Surgical History  Procedure Laterality Date  . Abdominal hysterectomy    . Mastectomy      modified radical in August 1996  . Foot surgery      for hammertoe    FAMILY HISTORY Family History  Problem Relation Age of Onset  . Cancer Brother     lung  . Hypertension Brother   . Anemia Brother   . Myelodysplastic syndrome  Brother        ADVANCED DIRECTIVES:    HEALTH MAINTENANCE: Social History  Substance Use Topics  . Smoking status: Never Smoker   . Smokeless tobacco: Never Used  . Alcohol Use: No     Colonoscopy:  PAP:  Bone density:  Lipid panel:  Allergies  Allergen Reactions  . Nitrofurantoin Other (See Comments)  . Penicillins Itching  . Sulfa Antibiotics Itching    Current Outpatient Prescriptions  Medication Sig Dispense Refill  . acetaminophen (RA ACETAMINOPHEN) 650 MG CR tablet Take 650 mg by mouth every 8 (eight) hours as needed.     Marland Kitchen alendronate (FOSAMAX) 70 MG tablet Take 70 mg by mouth once a week.   0  . Ascorbic Acid (VITAMIN C) 1000 MG tablet Take 1,000 mg by mouth daily.    Marland Kitchen aspirin 81 MG tablet Take 81 mg by mouth daily.    . calcium-vitamin D (OSCAL WITH D) 250-125 MG-UNIT per tablet Take 1 tablet by mouth daily.    Marland Kitchen gabapentin (NEURONTIN) 100 MG capsule Take 1 capsule by mouth at bedtime.    . psyllium (METAMUCIL) 58.6 % packet Take 1 packet by mouth daily.    . valsartan (DIOVAN) 40 MG tablet Take 40 mg by mouth daily.     No current facility-administered medications for this visit.    OBJECTIVE: Filed Vitals:   05/23/16 1501  BP: 154/66  Pulse: 80  Temp: 96.2 F (35.7 C)  Resp: 18     Body mass index  is 23.33 kg/(m^2).    ECOG FS:1 - Symptomatic but completely ambulatory  General: Well-developed, well-nourished, no acute distress. Eyes: Pink conjunctiva, anicteric sclera. Lungs: Clear to auscultation bilaterally. Heart: Regular rate and rhythm. No rubs, murmurs, or gallops. Abdomen: Soft, nontender, nondistended. No organomegaly noted, normoactive bowel sounds. Musculoskeletal: No edema, cyanosis, or clubbing. Neuro: Alert, answering all questions appropriately. Cranial nerves grossly intact. Skin: No rashes or petechiae noted. Psych: Normal affect.  LAB RESULTS:  Lab Results  Component Value Date   NA 129* 06/10/2014   K 4.3 06/10/2014   CL  96* 06/10/2014   CO2 23 06/10/2014   GLUCOSE 140* 06/10/2014   BUN 22* 06/10/2014   CREATININE 1.24 06/10/2014   CALCIUM 10.3* 06/10/2014   PROT 7.1 06/10/2014   ALBUMIN 2.3* 06/10/2014   AST 11* 06/10/2014   ALT 13* 06/10/2014   ALKPHOS 70 06/10/2014   BILITOT 0.4 06/10/2014   GFRNONAA 40* 06/10/2014   GFRAA 46* 06/10/2014    Lab Results  Component Value Date   WBC 3.0* 05/23/2016   NEUTROABS 1.0* 05/23/2016   HGB 7.6* 05/23/2016   HCT 21.6* 05/23/2016   MCV 98.4 05/23/2016   PLT 190 05/23/2016   Lab Results  Component Value Date   FERRITIN 302 05/23/2016   Lab Results  Component Value Date   IRON 100 05/23/2016   TIBC 241* 05/23/2016   IRONPCTSAT 42* 05/23/2016      STUDIES: No results found.  ASSESSMENT:  Anemia of chronic disease.  PLAN:    1. Anemia:  Patient's hemoglobin continues to be decreased and less than 10.0 at 7.6 today. Proceed with 40,000 units subcutaneous Procrit today.  Her most recent iron stores as above are adequate to proceed. Previously, the remainder of her laboratory work was either negative or within normal limits. Return to clinic every 8 weeks for laboratory work and consideration of Procrit. Patient will then return to clinic in 6 months with repeat laboratory work and further evaluation. If patient's hemoglobin trends further down from giving Procrit every 8 weeks or she becomes more symptomatic, will change her treatment back to every 4 weeks and possibly even increased dosing to every 3 weeks. Patient has been instructed to call if she becomes symptomatic. 2.  History of breast cancer: No evidence of disease.  Patient's mammogram's are ordered by her PCP. 3.  Hypertension: Blood pressure elevated today, but adequate to proceed with Procrit. Continue treatment per PCP.  Patient expressed understanding and was in agreement with this plan. She also understands that She can call clinic at any time with any questions, concerns, or  complaints.    [name], MD   05/23/2016 4:42 PM

## 2016-05-23 NOTE — Progress Notes (Signed)
Offers no complaints  

## 2016-06-17 DIAGNOSIS — D638 Anemia in other chronic diseases classified elsewhere: Secondary | ICD-10-CM | POA: Diagnosis not present

## 2016-06-17 DIAGNOSIS — N184 Chronic kidney disease, stage 4 (severe): Secondary | ICD-10-CM | POA: Diagnosis not present

## 2016-06-17 DIAGNOSIS — I10 Essential (primary) hypertension: Secondary | ICD-10-CM | POA: Diagnosis not present

## 2016-07-08 DIAGNOSIS — M75121 Complete rotator cuff tear or rupture of right shoulder, not specified as traumatic: Secondary | ICD-10-CM | POA: Diagnosis not present

## 2016-07-08 DIAGNOSIS — M12811 Other specific arthropathies, not elsewhere classified, right shoulder: Secondary | ICD-10-CM | POA: Diagnosis not present

## 2016-07-16 ENCOUNTER — Other Ambulatory Visit: Payer: Self-pay

## 2016-07-24 ENCOUNTER — Inpatient Hospital Stay: Payer: Medicare Other

## 2016-07-24 ENCOUNTER — Inpatient Hospital Stay: Payer: Medicare Other | Attending: Oncology

## 2016-08-12 DIAGNOSIS — Z880 Allergy status to penicillin: Secondary | ICD-10-CM | POA: Diagnosis not present

## 2016-08-12 DIAGNOSIS — D469 Myelodysplastic syndrome, unspecified: Secondary | ICD-10-CM | POA: Diagnosis not present

## 2016-08-12 DIAGNOSIS — N184 Chronic kidney disease, stage 4 (severe): Secondary | ICD-10-CM | POA: Diagnosis not present

## 2016-08-12 DIAGNOSIS — I10 Essential (primary) hypertension: Secondary | ICD-10-CM | POA: Diagnosis not present

## 2016-08-12 DIAGNOSIS — R5381 Other malaise: Secondary | ICD-10-CM | POA: Diagnosis not present

## 2016-09-23 ENCOUNTER — Inpatient Hospital Stay: Payer: Medicare Other | Attending: Oncology

## 2016-09-23 ENCOUNTER — Inpatient Hospital Stay: Payer: Medicare Other

## 2016-09-23 VITALS — BP 137/60 | HR 80 | Temp 97.0°F | Resp 18

## 2016-09-23 DIAGNOSIS — D638 Anemia in other chronic diseases classified elsewhere: Secondary | ICD-10-CM

## 2016-09-23 DIAGNOSIS — Z79899 Other long term (current) drug therapy: Secondary | ICD-10-CM | POA: Insufficient documentation

## 2016-09-23 DIAGNOSIS — I10 Essential (primary) hypertension: Secondary | ICD-10-CM | POA: Insufficient documentation

## 2016-09-23 LAB — CBC WITH DIFFERENTIAL/PLATELET
BASOS ABS: 0 10*3/uL (ref 0–0.1)
BASOS PCT: 0 %
Eosinophils Absolute: 0 10*3/uL (ref 0–0.7)
Eosinophils Relative: 0 %
HEMATOCRIT: 21.4 % — AB (ref 35.0–47.0)
Hemoglobin: 7.3 g/dL — ABNORMAL LOW (ref 12.0–16.0)
Lymphocytes Relative: 53 %
Lymphs Abs: 1.3 10*3/uL (ref 1.0–3.6)
MCH: 33.5 pg (ref 26.0–34.0)
MCHC: 33.9 g/dL (ref 32.0–36.0)
MCV: 99.1 fL (ref 80.0–100.0)
MONO ABS: 0.1 10*3/uL — AB (ref 0.2–0.9)
Monocytes Relative: 6 %
NEUTROS ABS: 1 10*3/uL — AB (ref 1.4–6.5)
Neutrophils Relative %: 41 %
PLATELETS: 149 10*3/uL — AB (ref 150–440)
RBC: 2.16 MIL/uL — AB (ref 3.80–5.20)
RDW: 19.5 % — AB (ref 11.5–14.5)
WBC: 2.5 10*3/uL — AB (ref 3.6–11.0)

## 2016-09-23 LAB — IRON AND TIBC
Iron: 81 ug/dL (ref 28–170)
SATURATION RATIOS: 30 % (ref 10.4–31.8)
TIBC: 274 ug/dL (ref 250–450)
UIBC: 193 ug/dL

## 2016-09-23 LAB — FERRITIN: FERRITIN: 388 ng/mL — AB (ref 11–307)

## 2016-09-23 MED ORDER — EPOETIN ALFA 40000 UNIT/ML IJ SOLN
40000.0000 [IU] | Freq: Once | INTRAMUSCULAR | Status: AC
  Administered 2016-09-23: 40000 [IU] via SUBCUTANEOUS
  Filled 2016-09-23: qty 1

## 2016-10-22 DIAGNOSIS — N184 Chronic kidney disease, stage 4 (severe): Secondary | ICD-10-CM | POA: Diagnosis not present

## 2016-10-22 DIAGNOSIS — L089 Local infection of the skin and subcutaneous tissue, unspecified: Secondary | ICD-10-CM | POA: Diagnosis not present

## 2016-10-22 DIAGNOSIS — S81812A Laceration without foreign body, left lower leg, initial encounter: Secondary | ICD-10-CM | POA: Diagnosis not present

## 2016-10-22 DIAGNOSIS — D469 Myelodysplastic syndrome, unspecified: Secondary | ICD-10-CM | POA: Diagnosis not present

## 2016-11-18 HISTORY — PX: BONE MARROW ASPIRATION: SHX1252

## 2016-11-22 ENCOUNTER — Other Ambulatory Visit: Payer: Self-pay | Admitting: *Deleted

## 2016-11-22 DIAGNOSIS — D649 Anemia, unspecified: Secondary | ICD-10-CM

## 2016-11-25 ENCOUNTER — Inpatient Hospital Stay: Payer: Medicare Other

## 2016-11-25 ENCOUNTER — Inpatient Hospital Stay: Payer: Medicare Other | Admitting: Oncology

## 2016-11-25 NOTE — Progress Notes (Deleted)
[place]  [phone]) [phone] Fax:(336) [phone]  ID: Ritaann Leppo OB: 1927/03/19  MR#: [id number]  [id number]  Patient Care Team: Cletis Athens, MD as PCP - General (Internal Medicine)  CHIEF COMPLAINT: Anemia of chronic disease.  INTERVAL HISTORY: Patient returns to clinic today for laboratory work and consideration of additional Procrit.  She currently feels well and is asymptomatic. She does not complain of weakness or fatigue today, Although she states she feels "lazy". She has no neurologic complaints. She denies any recent fevers, chills, night sweats, or weight loss.  She has no chest pain or shortness of breath.  She has a good appetite and denies any nausea, vomiting, constipation, or diarrhea.  She denies any melena or hematochezia.  She has no urinary complaints.  Patient offers no specific complaints today.   REVIEW OF SYSTEMS:   Review of Systems  Constitutional: Negative.  Negative for fever, malaise/fatigue and weight loss.  Respiratory: Negative.  Negative for shortness of breath.   Cardiovascular: Negative.  Negative for chest pain.  Gastrointestinal: Negative for blood in stool and melena.  Musculoskeletal: Negative.   Neurological: Negative.  Negative for weakness.  Psychiatric/Behavioral: Positive for memory loss.    As per HPI. Otherwise, a complete review of systems is negatve.  PAST MEDICAL HISTORY: Past Medical History:  Diagnosis Date  . Breast cancer   . Hiatal hernia   . Hypertension   . UTI (lower urinary tract infection)     PAST SURGICAL HISTORY: Past Surgical History:  Procedure Laterality Date  . ABDOMINAL HYSTERECTOMY    . FOOT SURGERY     for hammertoe  . MASTECTOMY     modified radical in August 1996    FAMILY HISTORY Family History  Problem Relation Age of Onset  . Cancer Brother     lung  . Hypertension Brother   . Anemia Brother   . Myelodysplastic syndrome Brother        ADVANCED  DIRECTIVES:    HEALTH MAINTENANCE: Social History  Substance Use Topics  . Smoking status: Never Smoker  . Smokeless tobacco: Never Used  . Alcohol use No     Colonoscopy:  PAP:  Bone density:  Lipid panel:  Allergies  Allergen Reactions  . Nitrofurantoin Other (See Comments)  . Penicillins Itching  . Sulfa Antibiotics Itching    Current Outpatient Prescriptions  Medication Sig Dispense Refill  . acetaminophen (RA ACETAMINOPHEN) 650 MG CR tablet Take 650 mg by mouth every 8 (eight) hours as needed.     Marland Kitchen alendronate (FOSAMAX) 70 MG tablet Take 70 mg by mouth once a week.   0  . Ascorbic Acid (VITAMIN C) 1000 MG tablet Take 1,000 mg by mouth daily.    Marland Kitchen aspirin 81 MG tablet Take 81 mg by mouth daily.    . calcium-vitamin D (OSCAL WITH D) 250-125 MG-UNIT per tablet Take 1 tablet by mouth daily.    Marland Kitchen gabapentin (NEURONTIN) 100 MG capsule Take 1 capsule by mouth at bedtime.    . psyllium (METAMUCIL) 58.6 % packet Take 1 packet by mouth daily.    . valsartan (DIOVAN) 40 MG tablet Take 40 mg by mouth daily.     No current facility-administered medications for this visit.     OBJECTIVE: There were no vitals filed for this visit.   There is no height or weight on file to calculate BMI.    ECOG FS:1 - Symptomatic but completely ambulatory  General: Well-developed, well-nourished, no acute distress.  Eyes: Pink conjunctiva, anicteric sclera. Lungs: Clear to auscultation bilaterally. Heart: Regular rate and rhythm. No rubs, murmurs, or gallops. Abdomen: Soft, nontender, nondistended. No organomegaly noted, normoactive bowel sounds. Musculoskeletal: No edema, cyanosis, or clubbing. Neuro: Alert, answering all questions appropriately. Cranial nerves grossly intact. Skin: No rashes or petechiae noted. Psych: Normal affect.  LAB RESULTS:  Lab Results  Component Value Date   NA 129 (L) 06/10/2014   K 4.3 06/10/2014   CL 96 (L) 06/10/2014   CO2 23 06/10/2014   GLUCOSE 140 (H)  06/10/2014   BUN 22 (H) 06/10/2014   CREATININE 1.24 06/10/2014   CALCIUM 10.3 (H) 06/10/2014   PROT 7.1 06/10/2014   ALBUMIN 2.3 (L) 06/10/2014   AST 11 (L) 06/10/2014   ALT 13 (L) 06/10/2014   ALKPHOS 70 06/10/2014   BILITOT 0.4 06/10/2014   GFRNONAA 40 (L) 06/10/2014   GFRAA 46 (L) 06/10/2014    Lab Results  Component Value Date   WBC 2.5 (L) 09/23/2016   NEUTROABS 1.0 (L) 09/23/2016   HGB 7.3 (L) 09/23/2016   HCT 21.4 (L) 09/23/2016   MCV 99.1 09/23/2016   PLT 149 (L) 09/23/2016   Lab Results  Component Value Date   FERRITIN 388 (H) 09/23/2016   Lab Results  Component Value Date   IRON 81 09/23/2016   TIBC 274 09/23/2016   IRONPCTSAT 30 09/23/2016      STUDIES: No results found.  ASSESSMENT:  Anemia of chronic disease.  PLAN:    1. Anemia of chronic disease:  Patient's hemoglobin continues to be decreased and less than 10.0 at 7.6 today. Proceed with 40,000 units subcutaneous Procrit today.  Her most recent iron stores as above are adequate to proceed. Previously, the remainder of her laboratory work was either negative or within normal limits. Return to clinic every 8 weeks for laboratory work and consideration of Procrit. Patient will then return to clinic in 6 months with repeat laboratory work and further evaluation. If patient's hemoglobin trends further down from giving Procrit every 8 weeks or she becomes more symptomatic, will change her treatment back to every 4 weeks and possibly even increased dosing to every 3 weeks. Patient has been instructed to call if she becomes symptomatic. 2.  History of breast cancer: No evidence of disease.  Patient's mammogram's are ordered by her PCP. 3.  Hypertension: Blood pressure elevated today, but adequate to proceed with Procrit. Continue treatment per PCP.  Patient expressed understanding and was in agreement with this plan. She also understands that She can call clinic at any time with any questions, concerns, or  complaints.    [name], MD   11/25/2016 8:54 AM

## 2016-12-09 NOTE — Progress Notes (Signed)
[place]  [phone]) [phone] Fax:(336) [phone]  ID: [name] OB: Mar 11, 1927  MR#: [id number]  [id number]  Patient Care Team: Cletis Athens, MD as PCP - General (Internal Medicine)  CHIEF COMPLAINT: Refractory anemia, unspecified.  INTERVAL HISTORY: Patient returns to clinic today, accompanied by her daughter, for laboratory work and consideration of additional Procrit.  She currently feels relatively well and is asymptomatic. She does not complain of weakness or fatigue today, She reports chronic left shoulder pain, being addressed by Dr. Roland Rack at [facility]. She reports occasional lightheadedness approximately once per week.  She reports short term memory loss, occasionally forgetting boiling water on the stove.  She has no other neurologic complaints. She denies any recent fevers, chills, night sweats, or weight loss.  She reports occasional shortness of breath with exertion, denies chest pain.  She reports occasional nausea, approximately once a week, and occasional abdominal distension requiring rest to resolve.  She has a good appetite and denies any vomiting, constipation, or diarrhea.  She denies any melena or hematochezia. She has no urinary complaints.  Patient offers no specific complaints today.   REVIEW OF SYSTEMS:   Review of Systems  Constitutional: Negative.  Negative for fever, malaise/fatigue and weight loss.  Respiratory: Positive for shortness of breath. Negative for wheezing.   Cardiovascular: Negative.  Negative for chest pain.  Gastrointestinal: Positive for abdominal pain and nausea. Negative for blood in stool, constipation, diarrhea, melena and vomiting.  Genitourinary: Negative.   Musculoskeletal: Positive for joint pain.  Neurological: Positive for dizziness. Negative for weakness and headaches.  Psychiatric/Behavioral: Positive for memory loss. The patient is not nervous/anxious and does not have insomnia.     As  per HPI. Otherwise, a complete review of systems is negative.  PAST MEDICAL HISTORY: Past Medical History:  Diagnosis Date  . Breast cancer   . Hiatal hernia   . Hypertension   . UTI (lower urinary tract infection)     PAST SURGICAL HISTORY: Past Surgical History:  Procedure Laterality Date  . ABDOMINAL HYSTERECTOMY    . FOOT SURGERY     for hammertoe  . MASTECTOMY     modified radical in August 1996    FAMILY HISTORY Family History  Problem Relation Age of Onset  . Cancer Brother     lung  . Hypertension Brother   . Anemia Brother   . Myelodysplastic syndrome Brother        ADVANCED DIRECTIVES:    HEALTH MAINTENANCE: Social History  Substance Use Topics  . Smoking status: Never Smoker  . Smokeless tobacco: Never Used  . Alcohol use No     Colonoscopy:  PAP:  Bone density:  Lipid panel:  Allergies  Allergen Reactions  . Nitrofurantoin Other (See Comments)  . Penicillins Itching  . Sulfa Antibiotics Itching    Current Outpatient Prescriptions  Medication Sig Dispense Refill  . acetaminophen (RA ACETAMINOPHEN) 650 MG CR tablet Take 650 mg by mouth every 8 (eight) hours as needed.     Marland Kitchen alendronate (FOSAMAX) 70 MG tablet Take 70 mg by mouth once a week.   0  . Ascorbic Acid (VITAMIN C) 1000 MG tablet Take 1,000 mg by mouth daily.    Marland Kitchen aspirin 81 MG tablet Take 81 mg by mouth daily.    . calcium-vitamin D (OSCAL WITH D) 250-125 MG-UNIT per tablet Take 1 tablet by mouth daily.    Marland Kitchen gabapentin (NEURONTIN) 100 MG capsule Take 1 capsule by mouth at bedtime.    Marland Kitchen  psyllium (METAMUCIL) 58.6 % packet Take 1 packet by mouth daily.    . valsartan (DIOVAN) 40 MG tablet Take 40 mg by mouth daily.     No current facility-administered medications for this visit.     OBJECTIVE: Vitals:   12/10/16 1031  BP: (!) 148/63  Pulse: 80  Resp: 18  Temp: 97.8 F (36.6 C)     Body mass index is 24.48 kg/m.    ECOG FS:1 - Symptomatic but completely  ambulatory  General: Well-developed, well-nourished, no acute distress. Eyes: Pink conjunctiva, anicteric sclera. Lungs: Clear to auscultation bilaterally. Heart: Regular rate and rhythm. No rubs, murmurs, or gallops. Abdomen: Soft, nontender, nondistended. No organomegaly noted, normoactive bowel sounds. Musculoskeletal: No edema, cyanosis, or clubbing. Neuro: Alert, answering all questions appropriately. Cranial nerves grossly intact. Skin: No rashes or petechiae noted. Psych: Normal affect.  LAB RESULTS:  Lab Results  Component Value Date   NA 129 (L) 06/10/2014   K 4.3 06/10/2014   CL 96 (L) 06/10/2014   CO2 23 06/10/2014   GLUCOSE 140 (H) 06/10/2014   BUN 22 (H) 06/10/2014   CREATININE 1.24 06/10/2014   CALCIUM 10.3 (H) 06/10/2014   PROT 7.1 06/10/2014   ALBUMIN 2.3 (L) 06/10/2014   AST 11 (L) 06/10/2014   ALT 13 (L) 06/10/2014   ALKPHOS 70 06/10/2014   BILITOT 0.4 06/10/2014   GFRNONAA 40 (L) 06/10/2014   GFRAA 46 (L) 06/10/2014    Lab Results  Component Value Date   WBC 2.5 (L) 12/10/2016   NEUTROABS 1.1 (L) 12/10/2016   HGB 7.3 (L) 12/10/2016   HCT 21.4 (L) 12/10/2016   MCV 97.4 12/10/2016   PLT 159 12/10/2016   Lab Results  Component Value Date   FERRITIN 388 (H) 09/23/2016   Lab Results  Component Value Date   IRON 81 09/23/2016   TIBC 274 09/23/2016   IRONPCTSAT 30 09/23/2016      STUDIES: No results found.  ASSESSMENT:  Refractory anemia, unspecified.  PLAN:    1. Refractory anemia, unspecified:  Patient's hemoglobin continues to be decreased and less than 10.0 at 7.3 today. Proceed with 40,000 units subcutaneous Procrit today.  Her most recent iron stores as above are adequate to proceed. Previously, the remainder of her laboratory work was either negative or within normal limits. Return to clinic every 4 weeks for the next 4 months laboratory work and consideration of Procrit. Patient will then return to clinic in 4 months with repeat  laboratory work and further evaluation. If patient's hemoglobin trends further down from giving Procrit every 4 weeks or she becomes more symptomatic, will consider increasing dosing to every 3 weeks. Patient has been instructed to call if she becomes symptomatic. 2.  History of breast cancer: No evidence of disease.  Patient's mammograms are ordered by her PCP. 3.  Hypertension: Blood pressure elevated today, but adequate to proceed with Procrit. Continue treatment per PCP. 4.  Home safety: Discussed with patient using electric kettle with automatic off switch to boil water.  Patient expressed understanding and was in agreement with this plan. She also understands that She can call clinic at any time with any questions, concerns, or complaints.   Lucendia Herrlich, NP  12/10/16 11:35 AM   Patient was seen and evaluated independently and I agree with the assessment and plan as dictated above.  [name], MD 12/12/16 9:06 AM

## 2016-12-10 ENCOUNTER — Inpatient Hospital Stay: Payer: Medicare Other

## 2016-12-10 ENCOUNTER — Inpatient Hospital Stay: Payer: Medicare Other | Attending: Oncology

## 2016-12-10 ENCOUNTER — Inpatient Hospital Stay (HOSPITAL_BASED_OUTPATIENT_CLINIC_OR_DEPARTMENT_OTHER): Payer: Medicare Other | Admitting: Oncology

## 2016-12-10 VITALS — BP 148/63 | HR 80 | Temp 97.8°F | Resp 18 | Wt 121.9 lb

## 2016-12-10 DIAGNOSIS — I10 Essential (primary) hypertension: Secondary | ICD-10-CM | POA: Diagnosis not present

## 2016-12-10 DIAGNOSIS — Z79899 Other long term (current) drug therapy: Secondary | ICD-10-CM | POA: Insufficient documentation

## 2016-12-10 DIAGNOSIS — R0602 Shortness of breath: Secondary | ICD-10-CM | POA: Insufficient documentation

## 2016-12-10 DIAGNOSIS — Z9010 Acquired absence of unspecified breast and nipple: Secondary | ICD-10-CM | POA: Diagnosis not present

## 2016-12-10 DIAGNOSIS — R110 Nausea: Secondary | ICD-10-CM | POA: Diagnosis not present

## 2016-12-10 DIAGNOSIS — Z7982 Long term (current) use of aspirin: Secondary | ICD-10-CM

## 2016-12-10 DIAGNOSIS — K449 Diaphragmatic hernia without obstruction or gangrene: Secondary | ICD-10-CM

## 2016-12-10 DIAGNOSIS — M25512 Pain in left shoulder: Secondary | ICD-10-CM | POA: Diagnosis not present

## 2016-12-10 DIAGNOSIS — D464 Refractory anemia, unspecified: Secondary | ICD-10-CM | POA: Insufficient documentation

## 2016-12-10 DIAGNOSIS — M2550 Pain in unspecified joint: Secondary | ICD-10-CM | POA: Diagnosis not present

## 2016-12-10 DIAGNOSIS — R140 Abdominal distension (gaseous): Secondary | ICD-10-CM

## 2016-12-10 DIAGNOSIS — Z853 Personal history of malignant neoplasm of breast: Secondary | ICD-10-CM

## 2016-12-10 DIAGNOSIS — D649 Anemia, unspecified: Secondary | ICD-10-CM

## 2016-12-10 DIAGNOSIS — Z87440 Personal history of urinary (tract) infections: Secondary | ICD-10-CM | POA: Diagnosis not present

## 2016-12-10 DIAGNOSIS — D638 Anemia in other chronic diseases classified elsewhere: Secondary | ICD-10-CM

## 2016-12-10 LAB — IRON AND TIBC
Iron: 92 ug/dL (ref 28–170)
Saturation Ratios: 34 % — ABNORMAL HIGH (ref 10.4–31.8)
TIBC: 274 ug/dL (ref 250–450)
UIBC: 182 ug/dL

## 2016-12-10 LAB — CBC WITH DIFFERENTIAL/PLATELET
Basophils Absolute: 0 10*3/uL (ref 0–0.1)
Basophils Relative: 1 %
EOS PCT: 0 %
Eosinophils Absolute: 0 10*3/uL (ref 0–0.7)
HEMATOCRIT: 21.4 % — AB (ref 35.0–47.0)
Hemoglobin: 7.3 g/dL — ABNORMAL LOW (ref 12.0–16.0)
LYMPHS ABS: 1.2 10*3/uL (ref 1.0–3.6)
LYMPHS PCT: 50 %
MCH: 33.3 pg (ref 26.0–34.0)
MCHC: 34.2 g/dL (ref 32.0–36.0)
MCV: 97.4 fL (ref 80.0–100.0)
MONO ABS: 0.1 10*3/uL — AB (ref 0.2–0.9)
MONOS PCT: 5 %
NEUTROS ABS: 1.1 10*3/uL — AB (ref 1.4–6.5)
Neutrophils Relative %: 44 %
Platelets: 159 10*3/uL (ref 150–440)
RBC: 2.2 MIL/uL — ABNORMAL LOW (ref 3.80–5.20)
RDW: 19.7 % — ABNORMAL HIGH (ref 11.5–14.5)
WBC: 2.5 10*3/uL — ABNORMAL LOW (ref 3.6–11.0)

## 2016-12-10 LAB — FERRITIN: Ferritin: 304 ng/mL (ref 11–307)

## 2016-12-10 MED ORDER — EPOETIN ALFA 40000 UNIT/ML IJ SOLN
40000.0000 [IU] | Freq: Once | INTRAMUSCULAR | Status: AC
  Administered 2016-12-10: 40000 [IU] via SUBCUTANEOUS
  Filled 2016-12-10: qty 1

## 2016-12-10 NOTE — Progress Notes (Signed)
Patient is here for follow up, she is doing well 

## 2016-12-17 ENCOUNTER — Other Ambulatory Visit: Payer: Self-pay

## 2016-12-17 ENCOUNTER — Ambulatory Visit: Payer: Self-pay

## 2016-12-24 ENCOUNTER — Other Ambulatory Visit: Payer: Self-pay

## 2016-12-24 ENCOUNTER — Ambulatory Visit: Payer: Self-pay

## 2016-12-31 ENCOUNTER — Ambulatory Visit: Payer: Self-pay

## 2016-12-31 ENCOUNTER — Other Ambulatory Visit: Payer: Self-pay

## 2017-01-07 ENCOUNTER — Other Ambulatory Visit: Payer: Self-pay

## 2017-01-07 ENCOUNTER — Ambulatory Visit: Payer: Self-pay

## 2017-01-07 ENCOUNTER — Inpatient Hospital Stay: Payer: Medicare Other

## 2017-01-07 ENCOUNTER — Inpatient Hospital Stay: Payer: Medicare Other | Attending: Oncology

## 2017-01-07 VITALS — BP 125/66 | HR 81

## 2017-01-07 DIAGNOSIS — D464 Refractory anemia, unspecified: Secondary | ICD-10-CM

## 2017-01-07 DIAGNOSIS — I10 Essential (primary) hypertension: Secondary | ICD-10-CM | POA: Diagnosis not present

## 2017-01-07 DIAGNOSIS — D638 Anemia in other chronic diseases classified elsewhere: Secondary | ICD-10-CM

## 2017-01-07 LAB — CBC WITH DIFFERENTIAL/PLATELET
Basophils Absolute: 0 10*3/uL (ref 0–0.1)
Basophils Relative: 0 %
Eosinophils Absolute: 0 10*3/uL (ref 0–0.7)
Eosinophils Relative: 0 %
HEMATOCRIT: 22.3 % — AB (ref 35.0–47.0)
Hemoglobin: 7.8 g/dL — ABNORMAL LOW (ref 12.0–16.0)
LYMPHS ABS: 1.6 10*3/uL (ref 1.0–3.6)
Lymphocytes Relative: 65 %
MCH: 34.1 pg — AB (ref 26.0–34.0)
MCHC: 35.1 g/dL (ref 32.0–36.0)
MCV: 97.1 fL (ref 80.0–100.0)
Monocytes Absolute: 0.1 10*3/uL — ABNORMAL LOW (ref 0.2–0.9)
Monocytes Relative: 6 %
NEUTROS ABS: 0.7 10*3/uL — AB (ref 1.4–6.5)
NEUTROS PCT: 29 %
Platelets: 152 10*3/uL (ref 150–440)
RBC: 2.3 MIL/uL — ABNORMAL LOW (ref 3.80–5.20)
RDW: 20.3 % — ABNORMAL HIGH (ref 11.5–14.5)
WBC: 2.5 10*3/uL — ABNORMAL LOW (ref 3.6–11.0)

## 2017-01-07 MED ORDER — EPOETIN ALFA 40000 UNIT/ML IJ SOLN
40000.0000 [IU] | Freq: Once | INTRAMUSCULAR | Status: AC
  Administered 2017-01-07: 40000 [IU] via SUBCUTANEOUS
  Filled 2017-01-07: qty 1

## 2017-01-28 DIAGNOSIS — I70203 Unspecified atherosclerosis of native arteries of extremities, bilateral legs: Secondary | ICD-10-CM | POA: Diagnosis not present

## 2017-01-28 DIAGNOSIS — B351 Tinea unguium: Secondary | ICD-10-CM | POA: Diagnosis not present

## 2017-01-28 DIAGNOSIS — L608 Other nail disorders: Secondary | ICD-10-CM | POA: Diagnosis not present

## 2017-01-28 DIAGNOSIS — E1142 Type 2 diabetes mellitus with diabetic polyneuropathy: Secondary | ICD-10-CM | POA: Diagnosis not present

## 2017-02-04 ENCOUNTER — Telehealth: Payer: Self-pay

## 2017-02-04 ENCOUNTER — Inpatient Hospital Stay: Payer: Medicare Other | Attending: Oncology

## 2017-02-04 ENCOUNTER — Inpatient Hospital Stay: Payer: Medicare Other

## 2017-02-04 VITALS — BP 127/84 | HR 88 | Temp 96.4°F | Resp 20

## 2017-02-04 DIAGNOSIS — I10 Essential (primary) hypertension: Secondary | ICD-10-CM | POA: Diagnosis not present

## 2017-02-04 DIAGNOSIS — Z79899 Other long term (current) drug therapy: Secondary | ICD-10-CM | POA: Diagnosis not present

## 2017-02-04 DIAGNOSIS — D638 Anemia in other chronic diseases classified elsewhere: Secondary | ICD-10-CM | POA: Diagnosis not present

## 2017-02-04 DIAGNOSIS — D464 Refractory anemia, unspecified: Secondary | ICD-10-CM

## 2017-02-04 LAB — CBC WITH DIFFERENTIAL/PLATELET
Basophils Absolute: 0 10*3/uL (ref 0–0.1)
Basophils Relative: 1 %
EOS ABS: 0 10*3/uL (ref 0–0.7)
EOS PCT: 0 %
HCT: 22.1 % — ABNORMAL LOW (ref 35.0–47.0)
HEMOGLOBIN: 7.8 g/dL — AB (ref 12.0–16.0)
LYMPHS ABS: 1.5 10*3/uL (ref 1.0–3.6)
Lymphocytes Relative: 60 %
MCH: 33.9 pg (ref 26.0–34.0)
MCHC: 35.3 g/dL (ref 32.0–36.0)
MCV: 96 fL (ref 80.0–100.0)
MONOS PCT: 6 %
Monocytes Absolute: 0.1 10*3/uL — ABNORMAL LOW (ref 0.2–0.9)
NEUTROS PCT: 33 %
Neutro Abs: 0.8 10*3/uL — ABNORMAL LOW (ref 1.4–6.5)
Platelets: 139 10*3/uL — ABNORMAL LOW (ref 150–440)
RBC: 2.3 MIL/uL — ABNORMAL LOW (ref 3.80–5.20)
RDW: 19.5 % — ABNORMAL HIGH (ref 11.5–14.5)
WBC: 2.5 10*3/uL — ABNORMAL LOW (ref 3.6–11.0)

## 2017-02-04 MED ORDER — EPOETIN ALFA 40000 UNIT/ML IJ SOLN
40000.0000 [IU] | Freq: Once | INTRAMUSCULAR | Status: AC
  Administered 2017-02-04: 40000 [IU] via SUBCUTANEOUS
  Filled 2017-02-04: qty 1

## 2017-02-04 NOTE — Telephone Encounter (Signed)
Patient came in this morning for lab work. Her daughter states the patient passed out this morning. Daughter reported to [facility] that she did hit her head but seems fine now. Doni notified me of this so we can inform Dr. Grayland Ormond. Patient was advised to go to the ER since she did hit her head and reports that she "just wants to go home and take a nap". Patient refused going to the ER after being advised twice it would be the best plan of action. Dr. Grayland Ormond is aware of patient passing out and hitting her head, and refusing to go to the ER.

## 2017-02-04 NOTE — Progress Notes (Signed)
Patient's daughter reports that patient had an episode of syncope this morning.  Patient was sitting when she called her daughter to the room and she was found slumped over in the chair.  Dr. Grayland Ormond informed.  Advised patient to call her PCP and let them know about this episode, daughter agrees.

## 2017-03-04 ENCOUNTER — Inpatient Hospital Stay: Payer: Medicare Other

## 2017-03-04 ENCOUNTER — Inpatient Hospital Stay: Payer: Medicare Other | Attending: Oncology

## 2017-03-04 VITALS — BP 146/79 | HR 80 | Temp 96.5°F | Resp 18

## 2017-03-04 DIAGNOSIS — D464 Refractory anemia, unspecified: Secondary | ICD-10-CM | POA: Diagnosis not present

## 2017-03-04 DIAGNOSIS — D638 Anemia in other chronic diseases classified elsewhere: Secondary | ICD-10-CM

## 2017-03-04 DIAGNOSIS — Z79899 Other long term (current) drug therapy: Secondary | ICD-10-CM | POA: Insufficient documentation

## 2017-03-04 LAB — CBC WITH DIFFERENTIAL/PLATELET
BASOS ABS: 0 10*3/uL (ref 0–0.1)
BASOS PCT: 1 %
Eosinophils Absolute: 0 10*3/uL (ref 0–0.7)
Eosinophils Relative: 0 %
HEMATOCRIT: 21.6 % — AB (ref 35.0–47.0)
Hemoglobin: 7.6 g/dL — ABNORMAL LOW (ref 12.0–16.0)
LYMPHS PCT: 58 %
Lymphs Abs: 1.8 10*3/uL (ref 1.0–3.6)
MCH: 34.1 pg — ABNORMAL HIGH (ref 26.0–34.0)
MCHC: 35.1 g/dL (ref 32.0–36.0)
MCV: 97.1 fL (ref 80.0–100.0)
Monocytes Absolute: 0.2 10*3/uL (ref 0.2–0.9)
Monocytes Relative: 6 %
NEUTROS ABS: 1.1 10*3/uL — AB (ref 1.4–6.5)
NEUTROS PCT: 35 %
PLATELETS: 130 10*3/uL — AB (ref 150–440)
RBC: 2.23 MIL/uL — AB (ref 3.80–5.20)
RDW: 19.8 % — ABNORMAL HIGH (ref 11.5–14.5)
WBC: 3.1 10*3/uL — AB (ref 3.6–11.0)

## 2017-03-04 MED ORDER — EPOETIN ALFA 40000 UNIT/ML IJ SOLN
40000.0000 [IU] | Freq: Once | INTRAMUSCULAR | Status: AC
  Administered 2017-03-04: 40000 [IU] via SUBCUTANEOUS
  Filled 2017-03-04: qty 1

## 2017-04-01 DIAGNOSIS — N184 Chronic kidney disease, stage 4 (severe): Secondary | ICD-10-CM | POA: Diagnosis not present

## 2017-04-01 DIAGNOSIS — I499 Cardiac arrhythmia, unspecified: Secondary | ICD-10-CM | POA: Diagnosis not present

## 2017-04-01 DIAGNOSIS — D469 Myelodysplastic syndrome, unspecified: Secondary | ICD-10-CM | POA: Diagnosis not present

## 2017-04-01 DIAGNOSIS — B351 Tinea unguium: Secondary | ICD-10-CM | POA: Diagnosis not present

## 2017-04-01 DIAGNOSIS — L608 Other nail disorders: Secondary | ICD-10-CM | POA: Diagnosis not present

## 2017-04-01 DIAGNOSIS — F418 Other specified anxiety disorders: Secondary | ICD-10-CM | POA: Diagnosis not present

## 2017-04-01 DIAGNOSIS — I70203 Unspecified atherosclerosis of native arteries of extremities, bilateral legs: Secondary | ICD-10-CM | POA: Diagnosis not present

## 2017-04-01 DIAGNOSIS — E1142 Type 2 diabetes mellitus with diabetic polyneuropathy: Secondary | ICD-10-CM | POA: Diagnosis not present

## 2017-04-02 DIAGNOSIS — R5381 Other malaise: Secondary | ICD-10-CM | POA: Diagnosis not present

## 2017-04-06 NOTE — Progress Notes (Addendum)
[place]  Telephone:(336) [phone] [phone]) [phone]  ID: Chelsea Horne OB: Nov 17, 1927  MR#: [id number]  RWE#:[id number]  Patient Care Team: Cletis Athens, MD as PCP - General (Internal Medicine)  CHIEF COMPLAINT: Refractory anemia, unspecified.  INTERVAL HISTORY: Patient returns to clinic today, accompanied by her daughter, for laboratory work and consideration of additional Procrit.  She currently feels relatively well and is asymptomatic. She does not complain of weakness or fatigue today. She has no neurologic complaints. She denies any recent fevers, chills, night sweats, or weight loss.  She denies any chest pain or shortness of breath. She has no nausea, vomiting, constipation, or diarrhea. She has no melena or hematochezia. She has no urinary complaints.  Patient offers no specific complaints today.   REVIEW OF SYSTEMS:   Review of Systems  Constitutional: Negative.  Negative for fever, malaise/fatigue and weight loss.  Respiratory: Negative for shortness of breath.   Cardiovascular: Negative.  Negative for chest pain and leg swelling.  Gastrointestinal: Negative for abdominal pain, blood in stool, constipation, diarrhea, melena, nausea and vomiting.  Genitourinary: Negative.   Musculoskeletal: Negative.  Negative for joint pain.  Skin: Negative.  Negative for rash.  Neurological: Negative.  Negative for dizziness, sensory change and weakness.  Psychiatric/Behavioral: Positive for memory loss. The patient is not nervous/anxious and does not have insomnia.     As per HPI. Otherwise, a complete review of systems is negative.  PAST MEDICAL HISTORY: Past Medical History:  Diagnosis Date  . Breast cancer   . Hiatal hernia   . Hypertension   . UTI (lower urinary tract infection)     PAST SURGICAL HISTORY: Past Surgical History:  Procedure Laterality Date  . ABDOMINAL HYSTERECTOMY    . FOOT SURGERY     for hammertoe  . MASTECTOMY     modified  radical in August 1996    FAMILY HISTORY Family History  Problem Relation Age of Onset  . Cancer Brother        lung  . Hypertension Brother   . Anemia Brother   . Myelodysplastic syndrome Brother        ADVANCED DIRECTIVES:    HEALTH MAINTENANCE: Social History  Substance Use Topics  . Smoking status: Never Smoker  . Smokeless tobacco: Never Used  . Alcohol use No     Colonoscopy:  PAP:  Bone density:  Lipid panel:  Allergies  Allergen Reactions  . Nitrofurantoin Other (See Comments)  . Penicillins Itching  . Sulfa Antibiotics Itching    Current Outpatient Prescriptions  Medication Sig Dispense Refill  . acetaminophen (RA ACETAMINOPHEN) 650 MG CR tablet Take 650 mg by mouth every 8 (eight) hours as needed.     Marland Kitchen alendronate (FOSAMAX) 70 MG tablet Take 70 mg by mouth once a week.   0  . Ascorbic Acid (VITAMIN C) 1000 MG tablet Take 1,000 mg by mouth daily.    Marland Kitchen aspirin 81 MG tablet Take 81 mg by mouth daily.    . calcium-vitamin D (OSCAL WITH D) 250-125 MG-UNIT per tablet Take 1 tablet by mouth daily.    Marland Kitchen gabapentin (NEURONTIN) 100 MG capsule Take 1 capsule by mouth at bedtime.    . psyllium (METAMUCIL) 58.6 % packet Take 1 packet by mouth daily.    . valsartan (DIOVAN) 40 MG tablet Take 40 mg by mouth daily.     No current facility-administered medications for this visit.     OBJECTIVE: Vitals:   04/08/17 1043  BP: 132/83  Pulse: 91  Resp: 20  Temp: (!) 96.8 F (36 C)     Body mass index is 23.85 kg/m.    ECOG FS:1 - Symptomatic but completely ambulatory  General: Well-developed, well-nourished, no acute distress. Eyes: Pink conjunctiva, anicteric sclera. Lungs: Clear to auscultation bilaterally. Heart: Regular rate and rhythm. No rubs, murmurs, or gallops. Abdomen: Soft, nontender, nondistended. No organomegaly noted, normoactive bowel sounds. Musculoskeletal: No edema, cyanosis, or clubbing. Neuro: Alert, answering all questions appropriately.  Cranial nerves grossly intact. Skin: No rashes or petechiae noted. Psych: Normal affect.  LAB RESULTS:  Lab Results  Component Value Date   NA 129 (L) 06/10/2014   K 4.3 06/10/2014   CL 96 (L) 06/10/2014   CO2 23 06/10/2014   GLUCOSE 140 (H) 06/10/2014   BUN 22 (H) 06/10/2014   CREATININE 1.24 06/10/2014   CALCIUM 10.3 (H) 06/10/2014   PROT 7.1 06/10/2014   ALBUMIN 2.3 (L) 06/10/2014   AST 11 (L) 06/10/2014   ALT 13 (L) 06/10/2014   ALKPHOS 70 06/10/2014   BILITOT 0.4 06/10/2014   GFRNONAA 40 (L) 06/10/2014   GFRAA 46 (L) 06/10/2014    Lab Results  Component Value Date   WBC 2.7 (L) 04/08/2017   NEUTROABS 1.1 (L) 04/08/2017   HGB 7.4 (L) 04/08/2017   HCT 21.2 (L) 04/08/2017   MCV 96.2 04/08/2017   PLT 192 04/08/2017   Lab Results  Component Value Date   FERRITIN 340 (H) 04/08/2017   Lab Results  Component Value Date   IRON 88 04/08/2017   TIBC 261 04/08/2017   IRONPCTSAT 34 (H) 04/08/2017      STUDIES: No results found.  ASSESSMENT:  Refractory anemia, unspecified.  PLAN:    1. Refractory anemia, unspecified:  Patient's hemoglobin continues to be decreased and less than 10.0 at 7.4 today. Proceed with 40,000 units subcutaneous Procrit today.  Her most recent iron stores as above are adequate to proceed. Previously, the remainder of her laboratory work was either negative or within normal limits. Return to clinic every 4 weeks for laboratory work and consideration of Procrit. Patient will then return to clinic in 4 months with repeat laboratory work and further evaluation. 2.  History of breast cancer: No evidence of disease.  Patient's mammograms are ordered by her PCP. 3.  Hypertension: Blood pressure is within normal limits today. Continue treatment per PCP. 4. Leukopenia: Mild, monitor.  Patient expressed understanding and was in agreement with this plan. She also understands that She can call clinic at any time with any questions, concerns, or  complaints.    [name], MD 04/14/17 6:32 PM    Addendum: Patient's hemoglobin is only mildly improved with using Procrit every 4 weeks, therefore will attempt to increase frequency of use and give 40,000 units every 2 weeks as needed.   [name], MD 07/25/17 9:47 AM

## 2017-04-08 ENCOUNTER — Inpatient Hospital Stay: Payer: Medicare Other

## 2017-04-08 ENCOUNTER — Inpatient Hospital Stay: Payer: Medicare Other | Attending: Oncology

## 2017-04-08 ENCOUNTER — Inpatient Hospital Stay (HOSPITAL_BASED_OUTPATIENT_CLINIC_OR_DEPARTMENT_OTHER): Payer: Medicare Other | Admitting: Oncology

## 2017-04-08 VITALS — BP 132/83 | HR 91 | Temp 96.8°F | Resp 20 | Wt 118.8 lb

## 2017-04-08 DIAGNOSIS — D72819 Decreased white blood cell count, unspecified: Secondary | ICD-10-CM

## 2017-04-08 DIAGNOSIS — Z853 Personal history of malignant neoplasm of breast: Secondary | ICD-10-CM | POA: Diagnosis not present

## 2017-04-08 DIAGNOSIS — Z801 Family history of malignant neoplasm of trachea, bronchus and lung: Secondary | ICD-10-CM | POA: Insufficient documentation

## 2017-04-08 DIAGNOSIS — D464 Refractory anemia, unspecified: Secondary | ICD-10-CM | POA: Insufficient documentation

## 2017-04-08 DIAGNOSIS — Z87442 Personal history of urinary calculi: Secondary | ICD-10-CM | POA: Diagnosis not present

## 2017-04-08 DIAGNOSIS — I10 Essential (primary) hypertension: Secondary | ICD-10-CM | POA: Diagnosis not present

## 2017-04-08 DIAGNOSIS — D638 Anemia in other chronic diseases classified elsewhere: Secondary | ICD-10-CM

## 2017-04-08 DIAGNOSIS — Z9010 Acquired absence of unspecified breast and nipple: Secondary | ICD-10-CM | POA: Insufficient documentation

## 2017-04-08 DIAGNOSIS — K449 Diaphragmatic hernia without obstruction or gangrene: Secondary | ICD-10-CM

## 2017-04-08 DIAGNOSIS — Z79899 Other long term (current) drug therapy: Secondary | ICD-10-CM | POA: Insufficient documentation

## 2017-04-08 DIAGNOSIS — Z8742 Personal history of other diseases of the female genital tract: Secondary | ICD-10-CM

## 2017-04-08 DIAGNOSIS — Z7982 Long term (current) use of aspirin: Secondary | ICD-10-CM | POA: Diagnosis not present

## 2017-04-08 LAB — CBC WITH DIFFERENTIAL/PLATELET
BASOS PCT: 1 %
Basophils Absolute: 0 10*3/uL (ref 0–0.1)
EOS ABS: 0 10*3/uL (ref 0–0.7)
Eosinophils Relative: 0 %
HCT: 21.2 % — ABNORMAL LOW (ref 35.0–47.0)
Hemoglobin: 7.4 g/dL — ABNORMAL LOW (ref 12.0–16.0)
LYMPHS PCT: 52 %
Lymphs Abs: 1.4 10*3/uL (ref 1.0–3.6)
MCH: 33.7 pg (ref 26.0–34.0)
MCHC: 35 g/dL (ref 32.0–36.0)
MCV: 96.2 fL (ref 80.0–100.0)
MONO ABS: 0.2 10*3/uL (ref 0.2–0.9)
Monocytes Relative: 8 %
Neutro Abs: 1.1 10*3/uL — ABNORMAL LOW (ref 1.4–6.5)
Neutrophils Relative %: 39 %
PLATELETS: 192 10*3/uL (ref 150–440)
RBC: 2.21 MIL/uL — AB (ref 3.80–5.20)
RDW: 19.7 % — AB (ref 11.5–14.5)
WBC: 2.7 10*3/uL — AB (ref 3.6–11.0)

## 2017-04-08 LAB — IRON AND TIBC
IRON: 88 ug/dL (ref 28–170)
Saturation Ratios: 34 % — ABNORMAL HIGH (ref 10.4–31.8)
TIBC: 261 ug/dL (ref 250–450)
UIBC: 173 ug/dL

## 2017-04-08 LAB — FERRITIN: Ferritin: 340 ng/mL — ABNORMAL HIGH (ref 11–307)

## 2017-04-08 MED ORDER — EPOETIN ALFA 40000 UNIT/ML IJ SOLN
40000.0000 [IU] | Freq: Once | INTRAMUSCULAR | Status: AC
  Administered 2017-04-08: 40000 [IU] via SUBCUTANEOUS
  Filled 2017-04-08: qty 1

## 2017-04-08 NOTE — Progress Notes (Signed)
Patient reports weakness and feeling tired.

## 2017-04-15 DIAGNOSIS — E1142 Type 2 diabetes mellitus with diabetic polyneuropathy: Secondary | ICD-10-CM | POA: Diagnosis not present

## 2017-05-09 ENCOUNTER — Inpatient Hospital Stay: Payer: Medicare Other | Attending: Oncology

## 2017-05-09 ENCOUNTER — Inpatient Hospital Stay: Payer: Medicare Other

## 2017-05-09 VITALS — BP 145/69

## 2017-05-09 DIAGNOSIS — D464 Refractory anemia, unspecified: Secondary | ICD-10-CM | POA: Diagnosis not present

## 2017-05-09 DIAGNOSIS — Z79899 Other long term (current) drug therapy: Secondary | ICD-10-CM | POA: Insufficient documentation

## 2017-05-09 DIAGNOSIS — D638 Anemia in other chronic diseases classified elsewhere: Secondary | ICD-10-CM

## 2017-05-09 LAB — CBC WITH DIFFERENTIAL/PLATELET
BASOS ABS: 0 10*3/uL (ref 0–0.1)
BASOS PCT: 1 %
Eosinophils Absolute: 0 10*3/uL (ref 0–0.7)
Eosinophils Relative: 0 %
HEMATOCRIT: 21.8 % — AB (ref 35.0–47.0)
Hemoglobin: 7.6 g/dL — ABNORMAL LOW (ref 12.0–16.0)
LYMPHS ABS: 1.9 10*3/uL (ref 1.0–3.6)
Lymphocytes Relative: 57 %
MCH: 33.8 pg (ref 26.0–34.0)
MCHC: 34.9 g/dL (ref 32.0–36.0)
MCV: 96.7 fL (ref 80.0–100.0)
MONOS PCT: 10 %
Monocytes Absolute: 0.3 10*3/uL (ref 0.2–0.9)
NEUTROS ABS: 1 10*3/uL — AB (ref 1.4–6.5)
Neutrophils Relative %: 32 %
PLATELETS: 162 10*3/uL (ref 150–440)
RBC: 2.25 MIL/uL — ABNORMAL LOW (ref 3.80–5.20)
RDW: 22.3 % — ABNORMAL HIGH (ref 11.5–14.5)
SMEAR REVIEW: ADEQUATE
WBC: 3.2 10*3/uL — ABNORMAL LOW (ref 3.6–11.0)

## 2017-05-09 MED ORDER — EPOETIN ALFA 40000 UNIT/ML IJ SOLN
40000.0000 [IU] | Freq: Once | INTRAMUSCULAR | Status: AC
  Administered 2017-05-09: 40000 [IU] via SUBCUTANEOUS
  Filled 2017-05-09: qty 1

## 2017-06-03 DIAGNOSIS — M2041 Other hammer toe(s) (acquired), right foot: Secondary | ICD-10-CM | POA: Diagnosis not present

## 2017-06-03 DIAGNOSIS — L03031 Cellulitis of right toe: Secondary | ICD-10-CM | POA: Diagnosis not present

## 2017-06-03 DIAGNOSIS — B351 Tinea unguium: Secondary | ICD-10-CM | POA: Diagnosis not present

## 2017-06-03 DIAGNOSIS — E1142 Type 2 diabetes mellitus with diabetic polyneuropathy: Secondary | ICD-10-CM | POA: Diagnosis not present

## 2017-06-03 DIAGNOSIS — I70203 Unspecified atherosclerosis of native arteries of extremities, bilateral legs: Secondary | ICD-10-CM | POA: Diagnosis not present

## 2017-06-03 DIAGNOSIS — L608 Other nail disorders: Secondary | ICD-10-CM | POA: Diagnosis not present

## 2017-06-10 DIAGNOSIS — L03031 Cellulitis of right toe: Secondary | ICD-10-CM | POA: Diagnosis not present

## 2017-06-13 ENCOUNTER — Inpatient Hospital Stay: Payer: Medicare Other | Attending: Oncology

## 2017-06-13 ENCOUNTER — Inpatient Hospital Stay: Payer: Medicare Other

## 2017-06-13 VITALS — BP 129/71 | HR 73

## 2017-06-13 DIAGNOSIS — D464 Refractory anemia, unspecified: Secondary | ICD-10-CM | POA: Insufficient documentation

## 2017-06-13 DIAGNOSIS — Z79899 Other long term (current) drug therapy: Secondary | ICD-10-CM | POA: Diagnosis not present

## 2017-06-13 DIAGNOSIS — D638 Anemia in other chronic diseases classified elsewhere: Secondary | ICD-10-CM

## 2017-06-13 LAB — CBC WITH DIFFERENTIAL/PLATELET
BASOS ABS: 0 10*3/uL (ref 0–0.1)
Basophils Relative: 1 %
EOS PCT: 0 %
Eosinophils Absolute: 0 10*3/uL (ref 0–0.7)
HCT: 20.2 % — ABNORMAL LOW (ref 35.0–47.0)
Hemoglobin: 7.1 g/dL — ABNORMAL LOW (ref 12.0–16.0)
LYMPHS PCT: 64 %
Lymphs Abs: 1.4 10*3/uL (ref 1.0–3.6)
MCH: 33.2 pg (ref 26.0–34.0)
MCHC: 35 g/dL (ref 32.0–36.0)
MCV: 94.9 fL (ref 80.0–100.0)
MONO ABS: 0.3 10*3/uL (ref 0.2–0.9)
MONOS PCT: 12 %
Neutro Abs: 0.5 10*3/uL — ABNORMAL LOW (ref 1.4–6.5)
Neutrophils Relative %: 23 %
PLATELETS: 159 10*3/uL (ref 150–440)
RBC: 2.13 MIL/uL — ABNORMAL LOW (ref 3.80–5.20)
RDW: 21 % — AB (ref 11.5–14.5)
WBC: 2.2 10*3/uL — ABNORMAL LOW (ref 3.6–11.0)

## 2017-06-13 MED ORDER — EPOETIN ALFA 40000 UNIT/ML IJ SOLN
40000.0000 [IU] | Freq: Once | INTRAMUSCULAR | Status: AC
  Administered 2017-06-13: 40000 [IU] via SUBCUTANEOUS
  Filled 2017-06-13: qty 1

## 2017-07-11 ENCOUNTER — Telehealth: Payer: Self-pay | Admitting: *Deleted

## 2017-07-11 ENCOUNTER — Other Ambulatory Visit: Payer: Self-pay | Admitting: *Deleted

## 2017-07-11 ENCOUNTER — Inpatient Hospital Stay: Payer: Medicare Other | Attending: Oncology

## 2017-07-11 ENCOUNTER — Inpatient Hospital Stay: Payer: Medicare Other

## 2017-07-11 VITALS — BP 128/70

## 2017-07-11 DIAGNOSIS — Z79899 Other long term (current) drug therapy: Secondary | ICD-10-CM | POA: Diagnosis not present

## 2017-07-11 DIAGNOSIS — D638 Anemia in other chronic diseases classified elsewhere: Secondary | ICD-10-CM

## 2017-07-11 DIAGNOSIS — D464 Refractory anemia, unspecified: Secondary | ICD-10-CM

## 2017-07-11 LAB — CBC WITH DIFFERENTIAL/PLATELET
BASOS ABS: 0 10*3/uL (ref 0–0.1)
BASOS PCT: 1 %
EOS PCT: 0 %
Eosinophils Absolute: 0 10*3/uL (ref 0–0.7)
HCT: 20 % — ABNORMAL LOW (ref 35.0–47.0)
Hemoglobin: 7 g/dL — ABNORMAL LOW (ref 12.0–16.0)
LYMPHS PCT: 54 %
Lymphs Abs: 1.6 10*3/uL (ref 1.0–3.6)
MCH: 33.3 pg (ref 26.0–34.0)
MCHC: 34.8 g/dL (ref 32.0–36.0)
MCV: 95.8 fL (ref 80.0–100.0)
MONO ABS: 0.2 10*3/uL (ref 0.2–0.9)
Monocytes Relative: 7 %
Neutro Abs: 1.1 10*3/uL — ABNORMAL LOW (ref 1.4–6.5)
Neutrophils Relative %: 38 %
PLATELETS: 160 10*3/uL (ref 150–440)
RBC: 2.09 MIL/uL — AB (ref 3.80–5.20)
RDW: 21.7 % — AB (ref 11.5–14.5)
WBC: 3 10*3/uL — AB (ref 3.6–11.0)

## 2017-07-11 MED ORDER — EPOETIN ALFA 40000 UNIT/ML IJ SOLN
40000.0000 [IU] | Freq: Once | INTRAMUSCULAR | Status: AC
  Administered 2017-07-11: 40000 [IU] via SUBCUTANEOUS
  Filled 2017-07-11: qty 1

## 2017-07-11 NOTE — Telephone Encounter (Signed)
Heather called to say that pt is 7.0 on hgb today and 4 weeks ago she was 7.1.  She had procrit ordered today and wanted to know if plans should change based on hgb level. I spoke with finnegan and he states to give shot today and make a lab appt in 2 weeks and poss. Blood at that time. Orders entered, in basket to make the extra appt. And Nira Conn told all of this and she will tell pt and send her to appt desk for the new appt

## 2017-07-25 ENCOUNTER — Inpatient Hospital Stay: Payer: Medicare Other | Attending: Oncology

## 2017-07-25 ENCOUNTER — Inpatient Hospital Stay: Payer: Medicare Other

## 2017-07-25 VITALS — BP 129/75 | HR 61 | Resp 18

## 2017-07-25 DIAGNOSIS — Z853 Personal history of malignant neoplasm of breast: Secondary | ICD-10-CM | POA: Insufficient documentation

## 2017-07-25 DIAGNOSIS — Z7982 Long term (current) use of aspirin: Secondary | ICD-10-CM | POA: Insufficient documentation

## 2017-07-25 DIAGNOSIS — Z801 Family history of malignant neoplasm of trachea, bronchus and lung: Secondary | ICD-10-CM | POA: Diagnosis not present

## 2017-07-25 DIAGNOSIS — I10 Essential (primary) hypertension: Secondary | ICD-10-CM | POA: Diagnosis not present

## 2017-07-25 DIAGNOSIS — D72819 Decreased white blood cell count, unspecified: Secondary | ICD-10-CM | POA: Insufficient documentation

## 2017-07-25 DIAGNOSIS — Z9013 Acquired absence of bilateral breasts and nipples: Secondary | ICD-10-CM | POA: Insufficient documentation

## 2017-07-25 DIAGNOSIS — K449 Diaphragmatic hernia without obstruction or gangrene: Secondary | ICD-10-CM | POA: Insufficient documentation

## 2017-07-25 DIAGNOSIS — Z79899 Other long term (current) drug therapy: Secondary | ICD-10-CM | POA: Diagnosis not present

## 2017-07-25 DIAGNOSIS — D464 Refractory anemia, unspecified: Secondary | ICD-10-CM | POA: Diagnosis not present

## 2017-07-25 DIAGNOSIS — Z87440 Personal history of urinary (tract) infections: Secondary | ICD-10-CM | POA: Diagnosis not present

## 2017-07-25 DIAGNOSIS — D638 Anemia in other chronic diseases classified elsewhere: Secondary | ICD-10-CM

## 2017-07-25 LAB — CBC
HCT: 21.6 % — ABNORMAL LOW (ref 35.0–47.0)
Hemoglobin: 7.4 g/dL — ABNORMAL LOW (ref 12.0–16.0)
MCH: 33.6 pg (ref 26.0–34.0)
MCHC: 34.5 g/dL (ref 32.0–36.0)
MCV: 97.6 fL (ref 80.0–100.0)
PLATELETS: 142 10*3/uL — AB (ref 150–440)
RBC: 2.21 MIL/uL — ABNORMAL LOW (ref 3.80–5.20)
RDW: 22.7 % — AB (ref 11.5–14.5)
WBC: 1.8 10*3/uL — ABNORMAL LOW (ref 3.6–11.0)

## 2017-07-25 LAB — SAMPLE TO BLOOD BANK

## 2017-07-25 MED ORDER — EPOETIN ALFA 40000 UNIT/ML IJ SOLN
40000.0000 [IU] | Freq: Once | INTRAMUSCULAR | Status: AC
  Administered 2017-07-25: 40000 [IU] via SUBCUTANEOUS
  Filled 2017-07-25: qty 1

## 2017-07-25 NOTE — Progress Notes (Signed)
Spoke with Dr. Grayland Ormond regarding patient's hemoglobin today of 7.4.  Dr. Grayland Ormond wants patient to receive Procrit today.  He placed an addendum to his previous note to reflect today's plan.  LJ

## 2017-08-05 DIAGNOSIS — E119 Type 2 diabetes mellitus without complications: Secondary | ICD-10-CM | POA: Diagnosis not present

## 2017-08-05 DIAGNOSIS — B351 Tinea unguium: Secondary | ICD-10-CM | POA: Diagnosis not present

## 2017-08-05 DIAGNOSIS — I70203 Unspecified atherosclerosis of native arteries of extremities, bilateral legs: Secondary | ICD-10-CM | POA: Diagnosis not present

## 2017-08-05 DIAGNOSIS — L608 Other nail disorders: Secondary | ICD-10-CM | POA: Diagnosis not present

## 2017-08-05 DIAGNOSIS — E1142 Type 2 diabetes mellitus with diabetic polyneuropathy: Secondary | ICD-10-CM | POA: Diagnosis not present

## 2017-08-13 NOTE — Progress Notes (Signed)
[facility]  Telephone:(336) [phone] [phone]  ID: Chelsea Horne OB: 03-06-1927  MR#: [id number]  OYP#:[id number]  Patient Care Team: Corky Downs, MD as PCP - General (Internal Medicine)  CHIEF COMPLAINT: Refractory anemia, unspecified.  INTERVAL HISTORY: Patient returns to clinic today, accompanied by her daughter, for laboratory work and consideration of additional Procrit.  She continues to complain of mild weakness and fatigue, but otherwise feels well. She has no neurologic complaints. She denies any recent fevers, chills, night sweats, or weight loss.  She denies any chest pain or shortness of breath. She has no nausea, vomiting, constipation, or diarrhea. She has no melena or hematochezia. She has no urinary complaints.  Patient offers no further specific complaints today.   REVIEW OF SYSTEMS:   Review of Systems  Constitutional: Positive for malaise/fatigue. Negative for fever and weight loss.  Respiratory: Negative for shortness of breath.   Cardiovascular: Negative.  Negative for chest pain and leg swelling.  Gastrointestinal: Negative for abdominal pain, blood in stool, constipation, diarrhea, melena, nausea and vomiting.  Genitourinary: Negative.   Musculoskeletal: Negative.  Negative for joint pain.  Skin: Negative.  Negative for rash.  Neurological: Positive for weakness. Negative for dizziness and sensory change.  Psychiatric/Behavioral: Positive for memory loss. The patient is not nervous/anxious and does not have insomnia.     As per HPI. Otherwise, a complete review of systems is negative.  PAST MEDICAL HISTORY: Past Medical History:  Diagnosis Date  . Breast cancer   . Hiatal hernia   . Hypertension   . UTI (lower urinary tract infection)     PAST SURGICAL HISTORY: Past Surgical History:  Procedure Laterality Date  . ABDOMINAL HYSTERECTOMY    . FOOT SURGERY     for hammertoe  . MASTECTOMY     modified radical in  August 1996    FAMILY HISTORY Family History  Problem Relation Age of Onset  . Cancer Brother        lung  . Hypertension Brother   . Anemia Brother   . Myelodysplastic syndrome Brother        ADVANCED DIRECTIVES:    HEALTH MAINTENANCE: Social History  Substance Use Topics  . Smoking status: Never Smoker  . Smokeless tobacco: Never Used  . Alcohol use No     Colonoscopy:  PAP:  Bone density:  Lipid panel:  Allergies  Allergen Reactions  . Nitrofurantoin Other (See Comments)  . Penicillins Itching  . Sulfa Antibiotics Itching    Current Outpatient Prescriptions  Medication Sig Dispense Refill  . acetaminophen (RA ACETAMINOPHEN) 650 MG CR tablet Take 650 mg by mouth every 8 (eight) hours as needed.     Marland Kitchen alendronate (FOSAMAX) 70 MG tablet Take 70 mg by mouth once a week.   0  . Ascorbic Acid (VITAMIN C) 1000 MG tablet Take 1,000 mg by mouth daily.    Marland Kitchen aspirin 81 MG tablet Take 81 mg by mouth daily.    . calcium-vitamin D (OSCAL WITH D) 250-125 MG-UNIT per tablet Take 1 tablet by mouth daily.    Marland Kitchen gabapentin (NEURONTIN) 100 MG capsule Take 1 capsule by mouth at bedtime.    . psyllium (METAMUCIL) 58.6 % packet Take 1 packet by mouth daily.    . valsartan (DIOVAN) 40 MG tablet Take 40 mg by mouth daily.     No current facility-administered medications for this visit.     OBJECTIVE: Vitals:   08/14/17 1355  BP: 137/81  Pulse: 80  Resp: 18  Temp: (!) 96.4 F (35.8 C)     Body mass index is 22.47 kg/m.    ECOG FS:1 - Symptomatic but completely ambulatory  General: Well-developed, well-nourished, no acute distress. Eyes: Pink conjunctiva, anicteric sclera. Lungs: Clear to auscultation bilaterally. Heart: Regular rate and rhythm. No rubs, murmurs, or gallops. Abdomen: Soft, nontender, nondistended. No organomegaly noted, normoactive bowel sounds. Musculoskeletal: No edema, cyanosis, or clubbing. Neuro: Alert, answering all questions appropriately. Cranial  nerves grossly intact. Skin: No rashes or petechiae noted. Psych: Normal affect.  LAB RESULTS:  Lab Results  Component Value Date   NA 129 (L) 06/10/2014   K 4.3 06/10/2014   CL 96 (L) 06/10/2014   CO2 23 06/10/2014   GLUCOSE 140 (H) 06/10/2014   BUN 22 (H) 06/10/2014   CREATININE 1.24 06/10/2014   CALCIUM 10.3 (H) 06/10/2014   PROT 7.1 06/10/2014   ALBUMIN 2.3 (L) 06/10/2014   AST 11 (L) 06/10/2014   ALT 13 (L) 06/10/2014   ALKPHOS 70 06/10/2014   BILITOT 0.4 06/10/2014   GFRNONAA 40 (L) 06/10/2014   GFRAA 46 (L) 06/10/2014    Lab Results  Component Value Date   WBC 2.6 (L) 08/14/2017   NEUTROABS 0.8 (L) 08/14/2017   HGB 7.2 (L) 08/14/2017   HCT 20.7 (L) 08/14/2017   MCV 97.7 08/14/2017   PLT 170 08/14/2017   Lab Results  Component Value Date   FERRITIN 495 (H) 08/14/2017   Lab Results  Component Value Date   IRON 83 08/14/2017   TIBC 230 (L) 08/14/2017   IRONPCTSAT 36 (H) 08/14/2017      STUDIES: No results found.  ASSESSMENT:  Refractory anemia, unspecified.  PLAN:    1. Refractory anemia, unspecified:  Patient's hemoglobin continues to be legally decreased despite receiving Procrit approximately every 4 weeks. We will proceed with Procrit today, but patient will also return to clinic tomorrow for one unit of packed red blood cells. Previously, the remainder of her laboratory work was either negative or within normal limits. It is possible patient has underlying MDS, but given her advanced age she does not wish to pursue bone marrow biopsy at this time. Return to clinic in 4 weeks with repeat laboratory work, further evaluation, consideration of additional Procrit.  2.  History of breast cancer: No evidence of disease.  Patient's mammograms are ordered by her PCP. 3.  Hypertension: Blood pressure is within normal limits today. Continue treatment per PCP. 4.  Leukopenia: Mild, monitor. Patient declining bone marrow biopsy as above.  Patient expressed  understanding and was in agreement with this plan. She also understands that She can call clinic at any time with any questions, concerns, or complaints.    [name], MD 08/15/17 5:32 PM

## 2017-08-14 ENCOUNTER — Inpatient Hospital Stay (HOSPITAL_BASED_OUTPATIENT_CLINIC_OR_DEPARTMENT_OTHER): Payer: Medicare Other | Admitting: Oncology

## 2017-08-14 ENCOUNTER — Inpatient Hospital Stay: Payer: Medicare Other

## 2017-08-14 VITALS — BP 137/81 | HR 80 | Temp 96.4°F | Resp 18 | Wt 111.9 lb

## 2017-08-14 DIAGNOSIS — D72819 Decreased white blood cell count, unspecified: Secondary | ICD-10-CM | POA: Diagnosis not present

## 2017-08-14 DIAGNOSIS — Z7982 Long term (current) use of aspirin: Secondary | ICD-10-CM

## 2017-08-14 DIAGNOSIS — Z9013 Acquired absence of bilateral breasts and nipples: Secondary | ICD-10-CM

## 2017-08-14 DIAGNOSIS — Z801 Family history of malignant neoplasm of trachea, bronchus and lung: Secondary | ICD-10-CM | POA: Diagnosis not present

## 2017-08-14 DIAGNOSIS — Z87440 Personal history of urinary (tract) infections: Secondary | ICD-10-CM

## 2017-08-14 DIAGNOSIS — Z853 Personal history of malignant neoplasm of breast: Secondary | ICD-10-CM

## 2017-08-14 DIAGNOSIS — D464 Refractory anemia, unspecified: Secondary | ICD-10-CM

## 2017-08-14 DIAGNOSIS — I10 Essential (primary) hypertension: Secondary | ICD-10-CM

## 2017-08-14 DIAGNOSIS — K449 Diaphragmatic hernia without obstruction or gangrene: Secondary | ICD-10-CM | POA: Diagnosis not present

## 2017-08-14 DIAGNOSIS — D638 Anemia in other chronic diseases classified elsewhere: Secondary | ICD-10-CM

## 2017-08-14 DIAGNOSIS — Z79899 Other long term (current) drug therapy: Secondary | ICD-10-CM | POA: Diagnosis not present

## 2017-08-14 LAB — SAMPLE TO BLOOD BANK

## 2017-08-14 LAB — CBC WITH DIFFERENTIAL/PLATELET
Basophils Absolute: 0 10*3/uL (ref 0–0.1)
Basophils Relative: 1 %
EOS PCT: 0 %
Eosinophils Absolute: 0 10*3/uL (ref 0–0.7)
HCT: 20.7 % — ABNORMAL LOW (ref 35.0–47.0)
HEMOGLOBIN: 7.2 g/dL — AB (ref 12.0–16.0)
LYMPHS ABS: 1.6 10*3/uL (ref 1.0–3.6)
LYMPHS PCT: 61 %
MCH: 34 pg (ref 26.0–34.0)
MCHC: 34.8 g/dL (ref 32.0–36.0)
MCV: 97.7 fL (ref 80.0–100.0)
Monocytes Absolute: 0.2 10*3/uL (ref 0.2–0.9)
Monocytes Relative: 7 %
NEUTROS PCT: 31 %
Neutro Abs: 0.8 10*3/uL — ABNORMAL LOW (ref 1.4–6.5)
Platelets: 170 10*3/uL (ref 150–440)
RBC: 2.12 MIL/uL — AB (ref 3.80–5.20)
RDW: 22.2 % — ABNORMAL HIGH (ref 11.5–14.5)
WBC: 2.6 10*3/uL — AB (ref 3.6–11.0)

## 2017-08-14 LAB — IRON AND TIBC
Iron: 83 ug/dL (ref 28–170)
SATURATION RATIOS: 36 % — AB (ref 10.4–31.8)
TIBC: 230 ug/dL — ABNORMAL LOW (ref 250–450)
UIBC: 147 ug/dL

## 2017-08-14 LAB — FERRITIN: Ferritin: 495 ng/mL — ABNORMAL HIGH (ref 11–307)

## 2017-08-14 LAB — ABO/RH: ABO/RH(D): O POS

## 2017-08-14 LAB — PREPARE RBC (CROSSMATCH)

## 2017-08-14 MED ORDER — EPOETIN ALFA 40000 UNIT/ML IJ SOLN
40000.0000 [IU] | Freq: Once | INTRAMUSCULAR | Status: AC
  Administered 2017-08-14: 40000 [IU] via SUBCUTANEOUS
  Filled 2017-08-14: qty 1

## 2017-08-14 NOTE — Progress Notes (Signed)
Patient reports fatigue and lack of energy today.

## 2017-08-15 ENCOUNTER — Inpatient Hospital Stay: Payer: Medicare Other

## 2017-08-15 DIAGNOSIS — D464 Refractory anemia, unspecified: Secondary | ICD-10-CM

## 2017-08-15 DIAGNOSIS — Z853 Personal history of malignant neoplasm of breast: Secondary | ICD-10-CM | POA: Diagnosis not present

## 2017-08-15 DIAGNOSIS — D72819 Decreased white blood cell count, unspecified: Secondary | ICD-10-CM | POA: Diagnosis not present

## 2017-08-15 DIAGNOSIS — K449 Diaphragmatic hernia without obstruction or gangrene: Secondary | ICD-10-CM | POA: Diagnosis not present

## 2017-08-15 DIAGNOSIS — Z79899 Other long term (current) drug therapy: Secondary | ICD-10-CM | POA: Diagnosis not present

## 2017-08-15 DIAGNOSIS — I10 Essential (primary) hypertension: Secondary | ICD-10-CM | POA: Diagnosis not present

## 2017-08-15 MED ORDER — ACETAMINOPHEN 325 MG PO TABS
650.0000 mg | ORAL_TABLET | Freq: Once | ORAL | Status: AC
Start: 2017-08-15 — End: 2017-08-15
  Administered 2017-08-15: 650 mg via ORAL
  Filled 2017-08-15: qty 2

## 2017-08-15 MED ORDER — SODIUM CHLORIDE 0.9 % IV SOLN
250.0000 mL | Freq: Once | INTRAVENOUS | Status: AC
  Administered 2017-08-15: 250 mL via INTRAVENOUS
  Filled 2017-08-15: qty 250

## 2017-08-15 MED ORDER — DIPHENHYDRAMINE HCL 50 MG/ML IJ SOLN
25.0000 mg | Freq: Once | INTRAMUSCULAR | Status: AC
  Administered 2017-08-15: 25 mg via INTRAVENOUS
  Filled 2017-08-15: qty 1

## 2017-08-17 LAB — BPAM RBC
BLOOD PRODUCT EXPIRATION DATE: 201810132359
Blood Product Expiration Date: 201810032359
ISSUE DATE / TIME: 201809281017
UNIT TYPE AND RH: 5100
Unit Type and Rh: 9500

## 2017-08-17 LAB — TYPE AND SCREEN
ABO/RH(D): O POS
Antibody Screen: NEGATIVE
UNIT DIVISION: 0
Unit division: 0

## 2017-09-15 NOTE — Progress Notes (Signed)
[facility]  Telephone:(336) [phone] Fax:(336) [phone]  ID: Aunna Snooks OB: 28-Nov-1926  MR#: [id number]  QIO#:[id number]  Patient Care Team: Cletis Athens, MD as PCP - General (Internal Medicine)  CHIEF COMPLAINT: Refractory anemia, unspecified.  INTERVAL HISTORY: Patient returns to clinic today, accompanied by her daughter, for laboratory work and further evaluation.  She had no appreciable change in her weakness and fatigue after receiving 1 unit of packed red blood cells 4 weeks ago.  Patient recently tripped and had a fall in her bedroom sustaining ecchymosis surrounding her right eye as well as bruised shoulder and knee.  She did not seek medical attention at the time. She has no neurologic complaints. She denies any recent fevers, chills, night sweats, or weight loss.  She denies any chest pain or shortness of breath. She has no nausea, vomiting, constipation, or diarrhea. She has no melena or hematochezia. She has no urinary complaints.  Patient offers no further specific complaints today.   REVIEW OF SYSTEMS:   Review of Systems  Constitutional: Positive for malaise/fatigue. Negative for fever and weight loss.  Eyes: Negative for blurred vision, double vision and pain.  Respiratory: Negative for shortness of breath.   Cardiovascular: Negative.  Negative for chest pain and leg swelling.  Gastrointestinal: Negative.  Negative for abdominal pain, blood in stool, constipation, diarrhea, melena, nausea and vomiting.  Genitourinary: Negative.   Musculoskeletal: Positive for falls. Negative for joint pain.  Skin: Negative.  Negative for rash.  Neurological: Positive for weakness. Negative for dizziness and sensory change.  Psychiatric/Behavioral: Positive for memory loss. The patient is not nervous/anxious and does not have insomnia.     As per HPI. Otherwise, a complete review of systems is negative.  PAST MEDICAL HISTORY: Past Medical History:  Diagnosis  Date  . Breast cancer ([place])   . Hiatal hernia   . Hypertension   . UTI (lower urinary tract infection)     PAST SURGICAL HISTORY: Past Surgical History:  Procedure Laterality Date  . ABDOMINAL HYSTERECTOMY    . FOOT SURGERY     for hammertoe  . MASTECTOMY     modified radical in August 1996    FAMILY HISTORY Family History  Problem Relation Age of Onset  . Cancer Brother        lung  . Hypertension Brother   . Anemia Brother   . Myelodysplastic syndrome Brother        ADVANCED DIRECTIVES:    HEALTH MAINTENANCE: Social History  Substance Use Topics  . Smoking status: Current Some Day Smoker    Packs/day: 0.25    Types: Cigarettes  . Smokeless tobacco: Never Used  . Alcohol use No     Colonoscopy:  PAP:  Bone density:  Lipid panel:  Allergies  Allergen Reactions  . Nitrofurantoin Other (See Comments)  . Penicillins Itching  . Sulfa Antibiotics Itching    Current Outpatient Prescriptions  Medication Sig Dispense Refill  . acetaminophen (RA ACETAMINOPHEN) 650 MG CR tablet Take 650 mg by mouth every 8 (eight) hours as needed.     Marland Kitchen alendronate (FOSAMAX) 70 MG tablet Take 70 mg by mouth once a week.   0  . Ascorbic Acid (VITAMIN C) 1000 MG tablet Take 1,000 mg by mouth daily.    Marland Kitchen aspirin 81 MG tablet Take 81 mg by mouth daily.    . calcium-vitamin D (OSCAL WITH D) 250-125 MG-UNIT per tablet Take 1 tablet by mouth daily.    Marland Kitchen gabapentin (NEURONTIN)  100 MG capsule Take 1 capsule by mouth at bedtime.    . Multiple Vitamin (MULTIVITAMIN) tablet Take 1 tablet by mouth daily.    . psyllium (METAMUCIL) 58.6 % packet Take 1 packet by mouth daily.    . valsartan (DIOVAN) 40 MG tablet Take 40 mg by mouth daily.     No current facility-administered medications for this visit.    Facility-Administered Medications Ordered in Other Visits  Medication Dose Route Frequency Provider Last Rate Last Dose  . epoetin alfa (EPOGEN,PROCRIT) injection 40,000 Units  40,000  Units Subcutaneous Once [name], MD        OBJECTIVE: Vitals:   09/17/17 1435  BP: 136/61  Pulse: 73  Resp: 16  Temp: 97.8 F (36.6 C)     Body mass index is 23.89 kg/m.    ECOG FS:2 - Symptomatic, <50% confined to bed  General: Well-developed, well-nourished, no acute distress. Eyes: Pink conjunctiva, anicteric sclera. Lungs: Clear to auscultation bilaterally. Heart: Regular rate and rhythm. No rubs, murmurs, or gallops. Abdomen: Soft, nontender, nondistended. No organomegaly noted, normoactive bowel sounds. Musculoskeletal: No edema, cyanosis, or clubbing. Neuro: Alert, answering all questions appropriately. Cranial nerves grossly intact. Skin: No rashes or petechiae noted. Psych: Normal affect.  LAB RESULTS:  Lab Results  Component Value Date   NA 129 (L) 06/10/2014   K 4.3 06/10/2014   CL 96 (L) 06/10/2014   CO2 23 06/10/2014   GLUCOSE 140 (H) 06/10/2014   BUN 22 (H) 06/10/2014   CREATININE 1.24 06/10/2014   CALCIUM 10.3 (H) 06/10/2014   PROT 7.1 06/10/2014   ALBUMIN 2.3 (L) 06/10/2014   AST 11 (L) 06/10/2014   ALT 13 (L) 06/10/2014   ALKPHOS 70 06/10/2014   BILITOT 0.4 06/10/2014   GFRNONAA 40 (L) 06/10/2014   GFRAA 46 (L) 06/10/2014    Lab Results  Component Value Date   WBC 2.1 (L) 09/17/2017   NEUTROABS 0.6 (L) 09/17/2017   HGB 7.6 (L) 09/17/2017   HCT 22.1 (L) 09/17/2017   MCV 97.8 09/17/2017   PLT 145 (L) 09/17/2017   Lab Results  Component Value Date   FERRITIN 408 (H) 09/17/2017   Lab Results  Component Value Date   IRON 134 09/17/2017   TIBC 229 (L) 09/17/2017   IRONPCTSAT 59 (H) 09/17/2017      STUDIES: No results found.  ASSESSMENT:  Refractory anemia, unspecified.  PLAN:    1. Refractory anemia, unspecified:  Patient's hemoglobin remains decreased despite receiving 1 unit of packed red blood cells 4 weeks ago.  Return to clinic on Friday for 1 additional unit of packed red blood cells.  Return to clinic in 2 weeks  with repeat laboratory can further evaluation. Previously, the remainder of her laboratory work was either negative or within normal limits. It is possible patient has underlying MDS and will consider a bone marrow biopsy at her next clinic visit if there is no appreciable improvement of her hemoglobin with transfusion. 2.  History of breast cancer: No evidence of disease.  Patient's mammograms are ordered by her PCP. 3.  Hypertension: Blood pressure is within normal limits today. Continue treatment per PCP. 4.  Leukopenia: Mild, monitor.  Bone marrow biopsy as above. 5.  Thrombocytopenia: Consider bone marrow biopsy as above.   Patient expressed understanding and was in agreement with this plan. She also understands that She can call clinic at any time with any questions, concerns, or complaints.    [name], MD 09/17/17 4:16  PM

## 2017-09-17 ENCOUNTER — Inpatient Hospital Stay (HOSPITAL_BASED_OUTPATIENT_CLINIC_OR_DEPARTMENT_OTHER): Payer: Medicare Other | Admitting: Oncology

## 2017-09-17 ENCOUNTER — Inpatient Hospital Stay: Payer: Medicare Other

## 2017-09-17 ENCOUNTER — Inpatient Hospital Stay: Payer: Medicare Other | Attending: Oncology

## 2017-09-17 ENCOUNTER — Encounter: Payer: Self-pay | Admitting: Oncology

## 2017-09-17 VITALS — BP 136/61 | HR 73 | Temp 97.8°F | Resp 16 | Wt 119.0 lb

## 2017-09-17 DIAGNOSIS — R5383 Other fatigue: Secondary | ICD-10-CM

## 2017-09-17 DIAGNOSIS — Z801 Family history of malignant neoplasm of trachea, bronchus and lung: Secondary | ICD-10-CM | POA: Diagnosis not present

## 2017-09-17 DIAGNOSIS — Z23 Encounter for immunization: Secondary | ICD-10-CM | POA: Insufficient documentation

## 2017-09-17 DIAGNOSIS — R531 Weakness: Secondary | ICD-10-CM

## 2017-09-17 DIAGNOSIS — Z79899 Other long term (current) drug therapy: Secondary | ICD-10-CM

## 2017-09-17 DIAGNOSIS — Z853 Personal history of malignant neoplasm of breast: Secondary | ICD-10-CM | POA: Insufficient documentation

## 2017-09-17 DIAGNOSIS — Z9010 Acquired absence of unspecified breast and nipple: Secondary | ICD-10-CM | POA: Insufficient documentation

## 2017-09-17 DIAGNOSIS — Z9181 History of falling: Secondary | ICD-10-CM | POA: Diagnosis not present

## 2017-09-17 DIAGNOSIS — D696 Thrombocytopenia, unspecified: Secondary | ICD-10-CM | POA: Insufficient documentation

## 2017-09-17 DIAGNOSIS — D464 Refractory anemia, unspecified: Secondary | ICD-10-CM | POA: Diagnosis not present

## 2017-09-17 DIAGNOSIS — K449 Diaphragmatic hernia without obstruction or gangrene: Secondary | ICD-10-CM | POA: Diagnosis not present

## 2017-09-17 DIAGNOSIS — Z7982 Long term (current) use of aspirin: Secondary | ICD-10-CM | POA: Insufficient documentation

## 2017-09-17 DIAGNOSIS — I10 Essential (primary) hypertension: Secondary | ICD-10-CM | POA: Diagnosis not present

## 2017-09-17 DIAGNOSIS — F17210 Nicotine dependence, cigarettes, uncomplicated: Secondary | ICD-10-CM

## 2017-09-17 DIAGNOSIS — D72819 Decreased white blood cell count, unspecified: Secondary | ICD-10-CM | POA: Insufficient documentation

## 2017-09-17 DIAGNOSIS — D638 Anemia in other chronic diseases classified elsewhere: Secondary | ICD-10-CM

## 2017-09-17 LAB — CBC WITH DIFFERENTIAL/PLATELET
BASOS ABS: 0 10*3/uL (ref 0–0.1)
Basophils Relative: 1 %
Eosinophils Absolute: 0 10*3/uL (ref 0–0.7)
Eosinophils Relative: 0 %
HEMATOCRIT: 22.1 % — AB (ref 35.0–47.0)
HEMOGLOBIN: 7.6 g/dL — AB (ref 12.0–16.0)
LYMPHS ABS: 1.3 10*3/uL (ref 1.0–3.6)
LYMPHS PCT: 63 %
MCH: 33.6 pg (ref 26.0–34.0)
MCHC: 34.3 g/dL (ref 32.0–36.0)
MCV: 97.8 fL (ref 80.0–100.0)
MONOS PCT: 9 %
Monocytes Absolute: 0.2 10*3/uL (ref 0.2–0.9)
Neutro Abs: 0.6 10*3/uL — ABNORMAL LOW (ref 1.4–6.5)
Neutrophils Relative %: 27 %
Platelets: 145 10*3/uL — ABNORMAL LOW (ref 150–440)
RBC: 2.26 MIL/uL — AB (ref 3.80–5.20)
RDW: 18.9 % — AB (ref 11.5–14.5)
WBC: 2.1 10*3/uL — AB (ref 3.6–11.0)

## 2017-09-17 LAB — IRON AND TIBC
Iron: 134 ug/dL (ref 28–170)
Saturation Ratios: 59 % — ABNORMAL HIGH (ref 10.4–31.8)
TIBC: 229 ug/dL — ABNORMAL LOW (ref 250–450)
UIBC: 95 ug/dL

## 2017-09-17 LAB — SAMPLE TO BLOOD BANK

## 2017-09-17 LAB — FERRITIN: FERRITIN: 408 ng/mL — AB (ref 11–307)

## 2017-09-17 MED ORDER — EPOETIN ALFA 40000 UNIT/ML IJ SOLN: 40000.0000 [IU] | Freq: Once | INTRAMUSCULAR | Status: DC

## 2017-09-17 MED ORDER — INFLUENZA VAC SPLIT HIGH-DOSE 0.5 ML IM SUSY
0.5000 mL | PREFILLED_SYRINGE | Freq: Once | INTRAMUSCULAR | Status: AC
  Administered 2017-09-17: 0.5 mL via INTRAMUSCULAR
  Filled 2017-09-17: qty 0.5

## 2017-09-17 NOTE — Progress Notes (Signed)
Patient here for follow up with labs today. She had a fall at home a week ago and bumped her head, her arm and leg. She did not go to ED, but her daughter states that she was alert and awake the whole time. She has visible bruising around her right eye today. She would like to get a flu shot today.

## 2017-09-18 LAB — PREPARE RBC (CROSSMATCH)

## 2017-09-19 ENCOUNTER — Inpatient Hospital Stay: Payer: Medicare Other | Attending: Oncology

## 2017-09-19 DIAGNOSIS — Z9013 Acquired absence of bilateral breasts and nipples: Secondary | ICD-10-CM | POA: Insufficient documentation

## 2017-09-19 DIAGNOSIS — K449 Diaphragmatic hernia without obstruction or gangrene: Secondary | ICD-10-CM | POA: Insufficient documentation

## 2017-09-19 DIAGNOSIS — R531 Weakness: Secondary | ICD-10-CM | POA: Diagnosis not present

## 2017-09-19 DIAGNOSIS — F17210 Nicotine dependence, cigarettes, uncomplicated: Secondary | ICD-10-CM | POA: Insufficient documentation

## 2017-09-19 DIAGNOSIS — R5383 Other fatigue: Secondary | ICD-10-CM | POA: Insufficient documentation

## 2017-09-19 DIAGNOSIS — Z801 Family history of malignant neoplasm of trachea, bronchus and lung: Secondary | ICD-10-CM | POA: Insufficient documentation

## 2017-09-19 DIAGNOSIS — Z803 Family history of malignant neoplasm of breast: Secondary | ICD-10-CM | POA: Insufficient documentation

## 2017-09-19 DIAGNOSIS — D696 Thrombocytopenia, unspecified: Secondary | ICD-10-CM | POA: Insufficient documentation

## 2017-09-19 DIAGNOSIS — Z87440 Personal history of urinary (tract) infections: Secondary | ICD-10-CM | POA: Diagnosis not present

## 2017-09-19 DIAGNOSIS — D72819 Decreased white blood cell count, unspecified: Secondary | ICD-10-CM | POA: Diagnosis not present

## 2017-09-19 DIAGNOSIS — Z79899 Other long term (current) drug therapy: Secondary | ICD-10-CM | POA: Insufficient documentation

## 2017-09-19 DIAGNOSIS — D464 Refractory anemia, unspecified: Secondary | ICD-10-CM | POA: Diagnosis not present

## 2017-09-19 DIAGNOSIS — I10 Essential (primary) hypertension: Secondary | ICD-10-CM | POA: Insufficient documentation

## 2017-09-19 DIAGNOSIS — Z7982 Long term (current) use of aspirin: Secondary | ICD-10-CM | POA: Diagnosis not present

## 2017-09-19 MED ORDER — ACETAMINOPHEN 325 MG PO TABS
650.0000 mg | ORAL_TABLET | Freq: Once | ORAL | Status: AC
  Administered 2017-09-19: 650 mg via ORAL
  Filled 2017-09-19: qty 2

## 2017-09-19 MED ORDER — SODIUM CHLORIDE 0.9 % IV SOLN
250.0000 mL | Freq: Once | INTRAVENOUS | Status: AC
  Administered 2017-09-19: 250 mL via INTRAVENOUS
  Filled 2017-09-19: qty 250

## 2017-09-19 MED ORDER — DIPHENHYDRAMINE HCL 50 MG/ML IJ SOLN
25.0000 mg | Freq: Once | INTRAMUSCULAR | Status: AC
  Administered 2017-09-19: 25 mg via INTRAVENOUS
  Filled 2017-09-19: qty 1

## 2017-09-20 LAB — TYPE AND SCREEN
ABO/RH(D): O POS
Antibody Screen: NEGATIVE
UNIT DIVISION: 0
Unit division: 0

## 2017-09-20 LAB — BPAM RBC
Blood Product Expiration Date: 201811072359
Blood Product Expiration Date: 201811302359
ISSUE DATE / TIME: 201811020945
ISSUE DATE / TIME: 201811021017
UNIT TYPE AND RH: 5100
UNIT TYPE AND RH: 9500

## 2017-09-28 NOTE — Progress Notes (Signed)
[place]  [phone] [phone]  ID: Chelsea Horne OB: 06-May-1927  MR#: [id number]  GBT#:[id number]  Patient Care Team: Cletis Athens, MD as PCP - General (Internal Medicine)  CHIEF COMPLAINT: Refractory anemia, unspecified.  INTERVAL HISTORY: Patient returns to clinic today, accompanied by her daughter, for laboratory work and further evaluation.  The patient states she feels improved since receiving 1 unit of packed red blood cells 2 weeks ago.  She currently feels well.  She has had no further falls. She has no neurologic complaints. She denies any recent fevers, chills, night sweats, or weight loss.  She denies any chest pain or shortness of breath. She has no nausea, vomiting, constipation, or diarrhea. She has no melena or hematochezia. She has no urinary complaints.  Patient offers no further specific complaints today.   REVIEW OF SYSTEMS:   Review of Systems  Constitutional: Positive for malaise/fatigue. Negative for fever and weight loss.  Eyes: Negative for blurred vision, double vision and pain.  Respiratory: Negative.  Negative for cough and shortness of breath.   Cardiovascular: Negative.  Negative for chest pain and leg swelling.  Gastrointestinal: Negative.  Negative for abdominal pain, blood in stool, constipation, diarrhea, melena, nausea and vomiting.  Genitourinary: Negative.   Musculoskeletal: Negative.  Negative for falls and joint pain.  Skin: Negative.  Negative for rash.  Neurological: Positive for weakness. Negative for dizziness and sensory change.  Psychiatric/Behavioral: Positive for memory loss. The patient is not nervous/anxious and does not have insomnia.     As per HPI. Otherwise, a complete review of systems is negative.  PAST MEDICAL HISTORY: Past Medical History:  Diagnosis Date  . Breast cancer ([place])   . Hiatal hernia   . Hypertension   . UTI (lower urinary tract infection)     PAST SURGICAL  HISTORY: Past Surgical History:  Procedure Laterality Date  . ABDOMINAL HYSTERECTOMY    . FOOT SURGERY     for hammertoe  . MASTECTOMY     modified radical in August 1996    FAMILY HISTORY Family History  Problem Relation Age of Onset  . Cancer Brother        lung  . Hypertension Brother   . Anemia Brother   . Myelodysplastic syndrome Brother        ADVANCED DIRECTIVES:    HEALTH MAINTENANCE: Social History   Tobacco Use  . Smoking status: Current Some Day Smoker    Packs/day: 0.25    Types: Cigarettes  . Smokeless tobacco: Never Used  Substance Use Topics  . Alcohol use: No  . Drug use: No     Colonoscopy:  PAP:  Bone density:  Lipid panel:  Allergies  Allergen Reactions  . Nitrofurantoin Other (See Comments)  . Penicillins Itching  . Sulfa Antibiotics Itching    Current Outpatient Medications  Medication Sig Dispense Refill  . acetaminophen (RA ACETAMINOPHEN) 650 MG CR tablet Take 650 mg by mouth every 8 (eight) hours as needed.     Marland Kitchen alendronate (FOSAMAX) 70 MG tablet Take 70 mg by mouth once a week.   0  . Ascorbic Acid (VITAMIN C) 1000 MG tablet Take 1,000 mg by mouth daily.    Marland Kitchen aspirin 81 MG tablet Take 81 mg by mouth daily.    . calcium-vitamin D (OSCAL WITH D) 250-125 MG-UNIT per tablet Take 1 tablet by mouth daily.    Marland Kitchen gabapentin (NEURONTIN) 100 MG capsule Take 1 capsule by mouth at bedtime.    Marland Kitchen  Multiple Vitamin (MULTIVITAMIN) tablet Take 1 tablet by mouth daily.    . psyllium (METAMUCIL) 58.6 % packet Take 1 packet by mouth daily.    . valsartan (DIOVAN) 40 MG tablet Take 40 mg by mouth daily.     No current facility-administered medications for this visit.     OBJECTIVE: There were no vitals filed for this visit.   There is no height or weight on file to calculate BMI.    ECOG FS:2 - Symptomatic, <50% confined to bed  General: Well-developed, well-nourished, no acute distress. Eyes: Pink conjunctiva, anicteric sclera. Lungs: Clear to  auscultation bilaterally. Heart: Regular rate and rhythm. No rubs, murmurs, or gallops. Abdomen: Soft, nontender, nondistended. No organomegaly noted, normoactive bowel sounds. Musculoskeletal: No edema, cyanosis, or clubbing. Neuro: Alert, answering all questions appropriately. Cranial nerves grossly intact. Skin: No rashes or petechiae noted. Psych: Normal affect.  LAB RESULTS:  Lab Results  Component Value Date   NA 129 (L) 06/10/2014   K 4.3 06/10/2014   CL 96 (L) 06/10/2014   CO2 23 06/10/2014   GLUCOSE 140 (H) 06/10/2014   BUN 22 (H) 06/10/2014   CREATININE 1.24 06/10/2014   CALCIUM 10.3 (H) 06/10/2014   PROT 7.1 06/10/2014   ALBUMIN 2.3 (L) 06/10/2014   AST 11 (L) 06/10/2014   ALT 13 (L) 06/10/2014   ALKPHOS 70 06/10/2014   BILITOT 0.4 06/10/2014   GFRNONAA 40 (L) 06/10/2014   GFRAA 46 (L) 06/10/2014    Lab Results  Component Value Date   WBC 2.1 (L) 09/17/2017   NEUTROABS 0.6 (L) 09/17/2017   HGB 7.6 (L) 09/17/2017   HCT 22.1 (L) 09/17/2017   MCV 97.8 09/17/2017   PLT 145 (L) 09/17/2017   Lab Results  Component Value Date   FERRITIN 408 (H) 09/17/2017   Lab Results  Component Value Date   IRON 134 09/17/2017   TIBC 229 (L) 09/17/2017   IRONPCTSAT 59 (H) 09/17/2017      STUDIES: No results found.  ASSESSMENT:  Refractory anemia, unspecified.  PLAN:    1. Refractory anemia, unspecified:  Patient's hemoglobin has improved and is now 9.0.  She does not require an additional unit of packed red blood cells today.  After lengthy discussion with the patient and her daughter, she has agreed to pursue bone marrow biopsy to confirm a diagnosis.  Patient would like to wait until after the Thanksgiving holiday.  She will return to clinic in 2 weeks for further evaluation and consideration of blood transfusion.  Her bone marrow biopsy will occur around the same time.   2.  History of breast cancer: No evidence of disease.  Patient's mammograms are ordered by her  PCP. 3.  Hypertension: Blood pressure is within normal limits today. Continue treatment per PCP. 4.  Leukopenia: Mild, monitor.  Bone marrow biopsy as above. 5.  Thrombocytopenia: Bone marrow biopsy as above.  Approximately 30 minutes was spent in discussion of which greater than 50% was consultation.  Patient expressed understanding and was in agreement with this plan. She also understands that She can call clinic at any time with any questions, concerns, or complaints.    [name], MD 09/28/17 11:07 AM

## 2017-09-30 ENCOUNTER — Other Ambulatory Visit: Payer: Self-pay

## 2017-09-30 DIAGNOSIS — D464 Refractory anemia, unspecified: Secondary | ICD-10-CM

## 2017-10-01 ENCOUNTER — Other Ambulatory Visit: Payer: Self-pay

## 2017-10-01 ENCOUNTER — Encounter: Payer: Self-pay | Admitting: Oncology

## 2017-10-01 ENCOUNTER — Inpatient Hospital Stay: Payer: Medicare Other

## 2017-10-01 ENCOUNTER — Inpatient Hospital Stay (HOSPITAL_BASED_OUTPATIENT_CLINIC_OR_DEPARTMENT_OTHER): Payer: Medicare Other | Admitting: Oncology

## 2017-10-01 VITALS — BP 145/73 | HR 73 | Temp 97.5°F | Ht 67.0 in | Wt 120.4 lb

## 2017-10-01 DIAGNOSIS — Z9013 Acquired absence of bilateral breasts and nipples: Secondary | ICD-10-CM

## 2017-10-01 DIAGNOSIS — Z803 Family history of malignant neoplasm of breast: Secondary | ICD-10-CM

## 2017-10-01 DIAGNOSIS — D696 Thrombocytopenia, unspecified: Secondary | ICD-10-CM

## 2017-10-01 DIAGNOSIS — Z7982 Long term (current) use of aspirin: Secondary | ICD-10-CM | POA: Diagnosis not present

## 2017-10-01 DIAGNOSIS — D464 Refractory anemia, unspecified: Secondary | ICD-10-CM

## 2017-10-01 DIAGNOSIS — I10 Essential (primary) hypertension: Secondary | ICD-10-CM

## 2017-10-01 DIAGNOSIS — Z801 Family history of malignant neoplasm of trachea, bronchus and lung: Secondary | ICD-10-CM

## 2017-10-01 DIAGNOSIS — R531 Weakness: Secondary | ICD-10-CM

## 2017-10-01 DIAGNOSIS — Z79899 Other long term (current) drug therapy: Secondary | ICD-10-CM

## 2017-10-01 DIAGNOSIS — D72819 Decreased white blood cell count, unspecified: Secondary | ICD-10-CM | POA: Diagnosis not present

## 2017-10-01 DIAGNOSIS — K449 Diaphragmatic hernia without obstruction or gangrene: Secondary | ICD-10-CM

## 2017-10-01 DIAGNOSIS — R5383 Other fatigue: Secondary | ICD-10-CM | POA: Diagnosis not present

## 2017-10-01 DIAGNOSIS — F17210 Nicotine dependence, cigarettes, uncomplicated: Secondary | ICD-10-CM

## 2017-10-01 DIAGNOSIS — Z87440 Personal history of urinary (tract) infections: Secondary | ICD-10-CM | POA: Diagnosis not present

## 2017-10-01 LAB — IRON AND TIBC
IRON: 214 ug/dL — AB (ref 28–170)
SATURATION RATIOS: 83 % — AB (ref 10.4–31.8)
TIBC: 256 ug/dL (ref 250–450)
UIBC: 43 ug/dL

## 2017-10-01 LAB — CBC WITH DIFFERENTIAL/PLATELET
BASOS ABS: 0 10*3/uL (ref 0–0.1)
BASOS PCT: 1 %
EOS ABS: 0 10*3/uL (ref 0–0.7)
Eosinophils Relative: 0 %
HEMATOCRIT: 26.1 % — AB (ref 35.0–47.0)
HEMOGLOBIN: 9 g/dL — AB (ref 12.0–16.0)
LYMPHS PCT: 68 %
Lymphs Abs: 1.2 10*3/uL (ref 1.0–3.6)
MCH: 33.7 pg (ref 26.0–34.0)
MCHC: 34.6 g/dL (ref 32.0–36.0)
MCV: 97.5 fL (ref 80.0–100.0)
MONOS PCT: 5 %
Monocytes Absolute: 0.1 10*3/uL — ABNORMAL LOW (ref 0.2–0.9)
NEUTROS ABS: 0.4 10*3/uL — AB (ref 1.4–6.5)
NEUTROS PCT: 26 %
Platelets: 97 10*3/uL — ABNORMAL LOW (ref 150–440)
RBC: 2.67 MIL/uL — ABNORMAL LOW (ref 3.80–5.20)
RDW: 17 % — ABNORMAL HIGH (ref 11.5–14.5)
SMEAR REVIEW: DECREASED
WBC: 1.7 10*3/uL — ABNORMAL LOW (ref 3.6–11.0)

## 2017-10-01 LAB — SAMPLE TO BLOOD BANK

## 2017-10-01 LAB — FERRITIN: FERRITIN: 595 ng/mL — AB (ref 11–307)

## 2017-10-01 NOTE — Progress Notes (Signed)
Patient here for follow up. Daughter reports shortness of breath with excertion, easily fatigued with normal actifity.

## 2017-10-10 ENCOUNTER — Other Ambulatory Visit: Payer: Self-pay | Admitting: Radiology

## 2017-10-13 ENCOUNTER — Encounter: Payer: Self-pay | Admitting: *Deleted

## 2017-10-13 ENCOUNTER — Ambulatory Visit
Admission: RE | Admit: 2017-10-13 | Discharge: 2017-10-13 | Disposition: A | Discharge status: Home / Self Care | Payer: Medicare Other | Source: Ambulatory Visit | Attending: Oncology | Admitting: Oncology

## 2017-10-13 ENCOUNTER — Other Ambulatory Visit (HOSPITAL_COMMUNITY)
Admission: RE | Admit: 2017-10-13 | Disposition: A | Discharge status: Home / Self Care | Payer: Medicare Other | Source: Ambulatory Visit | Attending: Oncology | Admitting: Oncology

## 2017-10-13 DIAGNOSIS — F17210 Nicotine dependence, cigarettes, uncomplicated: Secondary | ICD-10-CM | POA: Diagnosis not present

## 2017-10-13 DIAGNOSIS — D61818 Other pancytopenia: Secondary | ICD-10-CM | POA: Diagnosis not present

## 2017-10-13 DIAGNOSIS — D464 Refractory anemia, unspecified: Secondary | ICD-10-CM | POA: Insufficient documentation

## 2017-10-13 DIAGNOSIS — D72819 Decreased white blood cell count, unspecified: Secondary | ICD-10-CM | POA: Diagnosis not present

## 2017-10-13 DIAGNOSIS — Z9010 Acquired absence of unspecified breast and nipple: Secondary | ICD-10-CM | POA: Insufficient documentation

## 2017-10-13 DIAGNOSIS — D72822 Plasmacytosis: Secondary | ICD-10-CM | POA: Diagnosis not present

## 2017-10-13 DIAGNOSIS — J449 Chronic obstructive pulmonary disease, unspecified: Secondary | ICD-10-CM | POA: Diagnosis not present

## 2017-10-13 DIAGNOSIS — I10 Essential (primary) hypertension: Secondary | ICD-10-CM | POA: Diagnosis not present

## 2017-10-13 DIAGNOSIS — Z853 Personal history of malignant neoplasm of breast: Secondary | ICD-10-CM | POA: Diagnosis not present

## 2017-10-13 DIAGNOSIS — Z79899 Other long term (current) drug therapy: Secondary | ICD-10-CM | POA: Diagnosis not present

## 2017-10-13 DIAGNOSIS — Z7982 Long term (current) use of aspirin: Secondary | ICD-10-CM | POA: Diagnosis not present

## 2017-10-13 DIAGNOSIS — D649 Anemia, unspecified: Secondary | ICD-10-CM | POA: Insufficient documentation

## 2017-10-13 HISTORY — DX: Chronic obstructive pulmonary disease, unspecified: J44.9

## 2017-10-13 HISTORY — DX: Cardiac murmur, unspecified: R01.1

## 2017-10-13 HISTORY — DX: Urinary tract infection, site not specified: N39.0

## 2017-10-13 LAB — CBC WITH DIFFERENTIAL/PLATELET
BASOS ABS: 0 10*3/uL (ref 0–0.1)
Basophils Relative: 1 %
EOS ABS: 0 10*3/uL (ref 0–0.7)
Eosinophils Relative: 0 %
HCT: 24.3 % — ABNORMAL LOW (ref 35.0–47.0)
HEMOGLOBIN: 8.1 g/dL — AB (ref 12.0–16.0)
LYMPHS PCT: 72 %
Lymphs Abs: 1.6 10*3/uL (ref 1.0–3.6)
MCH: 32.5 pg (ref 26.0–34.0)
MCHC: 33.4 g/dL (ref 32.0–36.0)
MCV: 97.2 fL (ref 80.0–100.0)
Monocytes Absolute: 0.1 10*3/uL — ABNORMAL LOW (ref 0.2–0.9)
Monocytes Relative: 6 %
NEUTROS PCT: 21 %
Neutro Abs: 0.4 10*3/uL — ABNORMAL LOW (ref 1.4–6.5)
Platelets: 124 10*3/uL — ABNORMAL LOW (ref 150–440)
RBC: 2.51 MIL/uL — ABNORMAL LOW (ref 3.80–5.20)
RDW: 17.5 % — ABNORMAL HIGH (ref 11.5–14.5)
WBC: 2.1 10*3/uL — ABNORMAL LOW (ref 3.6–11.0)

## 2017-10-13 MED ORDER — SODIUM CHLORIDE 0.9 % IV SOLN
INTRAVENOUS | Status: DC
  Administered 2017-10-13: 08:00:00 via INTRAVENOUS

## 2017-10-13 MED ORDER — FENTANYL CITRATE (PF) 100 MCG/2ML IJ SOLN
INTRAMUSCULAR | Status: AC | PRN
  Administered 2017-10-13: 50 ug via INTRAVENOUS

## 2017-10-13 MED ORDER — MIDAZOLAM HCL 5 MG/5ML IJ SOLN
INTRAMUSCULAR | Status: AC | PRN
  Administered 2017-10-13 (×2): 0.5 mg via INTRAVENOUS
  Administered 2017-10-13: 1 mg via INTRAVENOUS

## 2017-10-13 MED ORDER — LIDOCAINE HCL (PF) 1 % IJ SOLN
INTRAMUSCULAR | Status: AC | PRN
  Administered 2017-10-13: 8 mL

## 2017-10-13 NOTE — H&P (Signed)
Chief Complaint: Patient was seen in consultation today for bone marrow biopsy at the request of ChelseaTimothy Horne  Referring Physician(s): ChelseaTimothy Horne  Patient Status: [facility] - Out-pt  History of Present Illness: Chelsea Horne is a 83 y.o. female with a history of recent refractory anemia requiring transfusion and leukopenia.  Here for bone marrow biopsy under CT guidance.  Currently asymptomatic.  Past Medical History:  Diagnosis Date  . Breast cancer ([place])    mastectomy  . COPD (chronic obstructive pulmonary disease) ([place])   . Heart murmur   . Hiatal hernia   . Hypertension   . UTI (lower urinary tract infection)   . UTI (urinary tract infection)    frequent in past    Past Surgical History:  Procedure Laterality Date  . ABDOMINAL HYSTERECTOMY    . FOOT SURGERY     for hammertoe  . MASTECTOMY     modified radical in August 1996    Allergies: Nitrofurantoin; Penicillins; and Sulfa antibiotics  Medications: Prior to Admission medications   Medication Sig Start Date End Date Taking? Authorizing Provider  acetaminophen (RA ACETAMINOPHEN) 650 MG CR tablet Take 650 mg by mouth every 8 (eight) hours as needed.    Yes [provider]  Ascorbic Acid (VITAMIN C) 1000 MG tablet Take 1,000 mg by mouth daily.   Yes [provider]  aspirin 81 MG tablet Take 81 mg by mouth daily.   Yes [provider]  calcium-vitamin D (OSCAL WITH D) 250-125 MG-UNIT per tablet Take 1 tablet by mouth daily.   Yes [provider]  gabapentin (NEURONTIN) 100 MG capsule Take 1 capsule by mouth at bedtime. 02/10/16  Yes [provider]  Multiple Vitamin (MULTIVITAMIN) tablet Take 1 tablet by mouth daily.   Yes [provider]  psyllium (METAMUCIL) 58.6 % packet Take 1 packet by mouth daily.   Yes [provider]  valsartan (DIOVAN) 40 MG tablet Take 40 mg by mouth daily.   Yes [provider]  alendronate  (FOSAMAX) 70 MG tablet Take 70 mg by mouth once a week.  04/01/15   [provider]     Family History  Problem Relation Age of Onset  . Cancer Brother        lung  . Hypertension Brother   . Anemia Brother   . Myelodysplastic syndrome Brother     Social History   Socioeconomic History  . Marital status: Widowed    Spouse name: None  . Number of children: None  . Years of education: None  . Highest education level: None  Social Needs  . Financial resource strain: None  . Food insecurity - worry: None  . Food insecurity - inability: None  . Transportation needs - medical: None  . Transportation needs - non-medical: None  Occupational History  . None  Tobacco Use  . Smoking status: Current Some Day Smoker    Packs/day: 0.25    Types: Cigarettes  . Smokeless tobacco: Never Used  Substance and Sexual Activity  . Alcohol use: No  . Drug use: No  . Sexual activity: No  Other Topics Concern  . None  Social History Narrative  . None    Review of Systems: A 12 point ROS discussed and pertinent positives are indicated in the HPI above.  All other systems are negative.  Review of Systems  Constitutional: Negative.   HENT: Negative.   Respiratory: Negative.   Cardiovascular: Negative.   Gastrointestinal: Negative.  Genitourinary: Negative.   Musculoskeletal: Negative.   Neurological: Negative.   Hematological: Negative.     Vital Signs: BP 127/84   Pulse 79   Temp 98 F (36.7 C) (Oral)   Resp 17   SpO2 100%   Physical Exam  Constitutional: She is oriented to person, place, and time. She appears well-developed and well-nourished. No distress.  HENT:  Head: Normocephalic and atraumatic.  Neck: Neck supple. No JVD present. No thyromegaly present.  Cardiovascular: Normal rate and regular rhythm. Exam reveals no gallop and no friction rub.  No murmur heard. Pulmonary/Chest: Effort normal and breath sounds normal. No stridor. No respiratory distress. She  has no wheezes. She has no rales.  Abdominal: Soft. Bowel sounds are normal. She exhibits no distension and no mass. There is no tenderness. There is no rebound and no guarding.  Lymphadenopathy:    She has no cervical adenopathy.  Neurological: She is alert and oriented to person, place, and time.  Skin: Skin is warm and dry. She is not diaphoretic.  Vitals reviewed.   Imaging: No results found.  Labs:  CBC: Recent Labs    08/14/17 1325 09/17/17 1414 10/01/17 0854 10/13/17 0733  WBC 2.6* 2.1* 1.7* 2.1*  HGB 7.2* 7.6* 9.0* 8.1*  HCT 20.7* 22.1* 26.1* 24.3*  PLT 170 145* 97* 124*    Assessment and Plan:  For CT guided bone marrow aspirate and biopsy today.  Risks and benefits discussed with the patient including, but not limited to bleeding, infection, damage to adjacent structures or low yield requiring additional tests. All of the patient's questions were answered, patient is agreeable to proceed. Consent signed and in chart.  Thank you for this interesting consult.  I greatly enjoyed meeting Chelsea Horne and look forward to participating in their care.  A copy of this report was sent to the requesting provider on this date.  Electronically Signed: Azzie Roup, MD 10/13/2017, 9:38 AM   I spent a total of 30 Minutes in face to face in clinical consultation, greater than 50% of which was counseling/coordinating care for bone marrow biopsy.

## 2017-10-13 NOTE — Procedures (Signed)
Interventional Radiology Procedure Note  Procedure: CT guided aspirate and core biopsy of right iliac bone Complications: None Recommendations: - Bedrest supine x 1 hrs - Follow biopsy results  Dontavion Noxon T. Maddalyn Lutze, M.D Pager:  [phone]   

## 2017-10-14 NOTE — Progress Notes (Signed)
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  INTERVAL HISTORY: Patient returns to clinic today, accompanied by her daughter, for laboratory work, discussion of her bone marrow biopsy results, and consideration of a transfusion. She feels improved since receiving 1 unit of packed red blood cells 2 weeks ago, but still weak and fatigued. She has had no further falls. She has no neurologic complaints. She denies any recent fevers, chills, night sweats, or weight loss.  She denies any chest pain or shortness of breath. She has no nausea, vomiting, constipation, or diarrhea. She has no melena or hematochezia. She has no urinary complaints.  Patient offers no further specific complaints today.   REVIEW OF SYSTEMS:   Review of Systems  Constitutional: Positive for malaise/fatigue. Negative for fever and weight loss.  Eyes: Negative for blurred vision, double vision and pain.  Respiratory: Negative.  Negative for cough and shortness of breath.   Cardiovascular: Negative.  Negative for chest pain and leg swelling.  Gastrointestinal: Negative.  Negative for abdominal pain, blood in stool, constipation, diarrhea, melena, nausea and vomiting.  Genitourinary: Negative.   Musculoskeletal: Negative.  Negative for falls and joint pain.  Skin: Negative.  Negative for rash.  Neurological: Positive for weakness. Negative for dizziness and sensory change.  Psychiatric/Behavioral: Positive for memory loss. The patient is not nervous/anxious and does not have insomnia.     As per HPI. Otherwise, a complete review of systems is negative.  PAST MEDICAL HISTORY: Past Medical History:  Diagnosis Date  . Breast cancer ([place])    mastectomy  . COPD (chronic obstructive  pulmonary disease) ([place])   . Heart murmur   . Hiatal hernia   . Hypertension   . UTI (lower urinary tract infection)   . UTI (urinary tract infection)    frequent in past    PAST SURGICAL HISTORY: Past Surgical History:  Procedure Laterality Date  . ABDOMINAL HYSTERECTOMY    . FOOT SURGERY     for hammertoe  . MASTECTOMY     modified radical in August 1996    FAMILY HISTORY Family History  Problem Relation Age of Onset  . Cancer Brother        lung  . Hypertension Brother   . Anemia Brother   . Myelodysplastic syndrome Brother        ADVANCED DIRECTIVES:    HEALTH MAINTENANCE: Social History   Tobacco Use  . Smoking status: Current Some Day Smoker    Packs/day: 0.25    Types: Cigarettes  . Smokeless tobacco: Never Used  Substance Use Topics  . Alcohol use: No  . Drug use: No     Colonoscopy:  PAP:  Bone density:  Lipid panel:  Allergies  Allergen Reactions  . Nitrofurantoin Other (See Comments)  . Penicillins Itching  . Sulfa Antibiotics Itching    Current Outpatient Medications  Medication Sig Dispense Refill  . acetaminophen (RA ACETAMINOPHEN) 650 MG CR tablet Take 650 mg by mouth every 8 (eight) hours as needed.     Marland Kitchen alendronate (FOSAMAX) 70 MG tablet Take 70 mg by mouth once a week.   0  . Ascorbic Acid (VITAMIN C) 1000 MG tablet Take 1,000 mg by mouth daily.    Marland Kitchen aspirin 81 MG tablet Take 81 mg by mouth daily.    Marland Kitchen  calcium-vitamin D (OSCAL WITH D) 250-125 MG-UNIT per tablet Take 1 tablet by mouth daily.    Marland Kitchen gabapentin (NEURONTIN) 100 MG capsule Take 1 capsule by mouth at bedtime.    . Multiple Vitamin (MULTIVITAMIN) tablet Take 1 tablet by mouth daily.    . psyllium (METAMUCIL) 58.6 % packet Take 1 packet by mouth daily.    . valsartan (DIOVAN) 40 MG tablet Take 40 mg by mouth daily.     No current facility-administered medications for this visit.     OBJECTIVE: Vitals:   10/16/17 0930  BP: (!) 145/81  Pulse: 82  Resp: 20  Temp:  (!) 96.9 F (36.1 C)     Body mass index is 19.2 kg/m.    ECOG FS:1 - Symptomatic but completely ambulatory  General: Well-developed, well-nourished, no acute distress. Eyes: Pink conjunctiva, anicteric sclera. Lungs: Clear to auscultation bilaterally. Heart: Regular rate and rhythm. No rubs, murmurs, or gallops. Abdomen: Soft, nontender, nondistended. No organomegaly noted, normoactive bowel sounds. Musculoskeletal: No edema, cyanosis, or clubbing. Neuro: Alert, answering all questions appropriately. Cranial nerves grossly intact. Skin: No rashes or petechiae noted. Psych: Normal affect.  LAB RESULTS:  Lab Results  Component Value Date   NA 129 (L) 06/10/2014   K 4.3 06/10/2014   CL 96 (L) 06/10/2014   CO2 23 06/10/2014   GLUCOSE 140 (H) 06/10/2014   BUN 22 (H) 06/10/2014   CREATININE 1.24 06/10/2014   CALCIUM 10.3 (H) 06/10/2014   PROT 7.1 06/10/2014   ALBUMIN 2.3 (L) 06/10/2014   AST 11 (L) 06/10/2014   ALT 13 (L) 06/10/2014   ALKPHOS 70 06/10/2014   BILITOT 0.4 06/10/2014   GFRNONAA 40 (L) 06/10/2014   GFRAA 46 (L) 06/10/2014    Lab Results  Component Value Date   WBC 1.4 (LL) 10/16/2017   NEUTROABS 0.4 (L) 10/16/2017   HGB 7.6 (L) 10/16/2017   HCT 22.6 (L) 10/16/2017   MCV 98.2 10/16/2017   PLT 127 (L) 10/16/2017   Lab Results  Component Value Date   FERRITIN 595 (H) 10/01/2017   Lab Results  Component Value Date   IRON 214 (H) 10/01/2017   TIBC 256 10/01/2017   IRONPCTSAT 83 (H) 10/01/2017      STUDIES: Ct Biopsy  Result Date: October 28, 2017 CLINICAL DATA:  Refractory anemia requiring transfusion and leukopenia. EXAM: CT GUIDED BONE MARROW ASPIRATION AND BIOPSY ANESTHESIA/SEDATION: Versed 2.0 mg IV, Fentanyl 50 mcg IV Total Moderate Sedation Time:  20 minutes. The patient's level of consciousness and physiologic status were continuously monitored during the procedure by Radiology nursing. PROCEDURE: The procedure risks, benefits, and alternatives were  explained to the patient. Questions regarding the procedure were encouraged and answered. The patient understands and consents to the procedure. A time out was performed prior to initiating the procedure. The right gluteal region was prepped with chlorhexidine. Sterile gown and sterile gloves were used for the procedure. Local anesthesia was provided with 1% Lidocaine. Under CT guidance, an 11 gauge On Control bone cutting needle was advanced from a posterior approach into the right iliac bone. Needle positioning was confirmed with CT. Initial non heparinized and heparinized aspirate samples were obtained of bone marrow. Core biopsy was performed via the On Control drill needle. Three separate passes were made to obtain core biopsy samples. COMPLICATIONS: None FINDINGS: Inspection of initial aspirate did reveal visible particles. Intact core biopsy samples were obtained. IMPRESSION: CT guided bone marrow biopsy of right posterior iliac bone with both aspirate and core samples obtained.  Electronically Signed   By: Aletta Edouard M.D.   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  PLAN:    1. 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  Flow cytometry and FISH are pending.  Patient also noted increased blast count, but unclear how much.  She also was noted to have an increase in clonal plasma cells possibly indicating an additional plasma cell dyscrasia.  Continue supportive care as needed.  Proceed with 1 unit packed red blood cells today.  Return to clinic in 2 weeks for further evaluation and consideration of blood transfusion.   2.  History of breast cancer: No evidence of disease.  Patient's mammograms are ordered by her PCP. 3.  Hypertension: Blood pressure is within normal limits today. Continue treatment per PCP. 4.  Leukopenia: Mild, monitor.  Bone marrow biopsy as above. 5.  Thrombocytopenia: Bone marrow biopsy as  above. 6.  Elevated iron stores: Patient will likely require Desferal with transfusions.  Approximately 30 minutes was spent in discussion of which greater than 50% was consultation.  Patient expressed understanding and was in agreement with this plan. She also understands that She can call clinic at any time with any questions, concerns, or complaints.    [name], MD 10/18/17 8:08 AM

## 2017-10-15 ENCOUNTER — Ambulatory Visit: Payer: Self-pay | Admitting: Oncology

## 2017-10-15 ENCOUNTER — Other Ambulatory Visit: Payer: Self-pay

## 2017-10-15 ENCOUNTER — Other Ambulatory Visit: Payer: Self-pay | Admitting: *Deleted

## 2017-10-15 DIAGNOSIS — D649 Anemia, unspecified: Secondary | ICD-10-CM

## 2017-10-16 ENCOUNTER — Inpatient Hospital Stay (HOSPITAL_BASED_OUTPATIENT_CLINIC_OR_DEPARTMENT_OTHER): Payer: Medicare Other | Admitting: Oncology

## 2017-10-16 ENCOUNTER — Encounter: Payer: Self-pay | Admitting: Oncology

## 2017-10-16 ENCOUNTER — Inpatient Hospital Stay: Payer: Medicare Other

## 2017-10-16 ENCOUNTER — Other Ambulatory Visit: Payer: Self-pay

## 2017-10-16 VITALS — BP 145/81 | HR 82 | Temp 96.9°F | Resp 20 | Wt 122.6 lb

## 2017-10-16 DIAGNOSIS — D72819 Decreased white blood cell count, unspecified: Secondary | ICD-10-CM

## 2017-10-16 DIAGNOSIS — F17210 Nicotine dependence, cigarettes, uncomplicated: Secondary | ICD-10-CM | POA: Diagnosis not present

## 2017-10-16 DIAGNOSIS — R531 Weakness: Secondary | ICD-10-CM | POA: Diagnosis not present

## 2017-10-16 DIAGNOSIS — D464 Refractory anemia, unspecified: Secondary | ICD-10-CM | POA: Diagnosis not present

## 2017-10-16 DIAGNOSIS — I10 Essential (primary) hypertension: Secondary | ICD-10-CM | POA: Diagnosis not present

## 2017-10-16 DIAGNOSIS — Z79899 Other long term (current) drug therapy: Secondary | ICD-10-CM

## 2017-10-16 DIAGNOSIS — Z803 Family history of malignant neoplasm of breast: Secondary | ICD-10-CM

## 2017-10-16 DIAGNOSIS — Z801 Family history of malignant neoplasm of trachea, bronchus and lung: Secondary | ICD-10-CM

## 2017-10-16 DIAGNOSIS — D649 Anemia, unspecified: Secondary | ICD-10-CM

## 2017-10-16 DIAGNOSIS — Z87440 Personal history of urinary (tract) infections: Secondary | ICD-10-CM | POA: Diagnosis not present

## 2017-10-16 DIAGNOSIS — R5383 Other fatigue: Secondary | ICD-10-CM | POA: Diagnosis not present

## 2017-10-16 DIAGNOSIS — K449 Diaphragmatic hernia without obstruction or gangrene: Secondary | ICD-10-CM | POA: Diagnosis not present

## 2017-10-16 DIAGNOSIS — Z7982 Long term (current) use of aspirin: Secondary | ICD-10-CM

## 2017-10-16 DIAGNOSIS — Z9013 Acquired absence of bilateral breasts and nipples: Secondary | ICD-10-CM

## 2017-10-16 DIAGNOSIS — D696 Thrombocytopenia, unspecified: Secondary | ICD-10-CM | POA: Diagnosis not present

## 2017-10-16 LAB — CBC WITH DIFFERENTIAL/PLATELET
BASOS ABS: 0 10*3/uL (ref 0–0.1)
BASOS PCT: 1 %
Eosinophils Absolute: 0 10*3/uL (ref 0–0.7)
Eosinophils Relative: 0 %
HCT: 22.6 % — ABNORMAL LOW (ref 35.0–47.0)
HEMOGLOBIN: 7.6 g/dL — AB (ref 12.0–16.0)
Lymphocytes Relative: 70 %
Lymphs Abs: 0.9 10*3/uL — ABNORMAL LOW (ref 1.0–3.6)
MCH: 33.2 pg (ref 26.0–34.0)
MCHC: 33.8 g/dL (ref 32.0–36.0)
MCV: 98.2 fL (ref 80.0–100.0)
Monocytes Absolute: 0.1 10*3/uL — ABNORMAL LOW (ref 0.2–0.9)
Monocytes Relative: 4 %
NEUTROS ABS: 0.4 10*3/uL — AB (ref 1.7–7.7)
NEUTROS PCT: 25 %
Platelets: 127 10*3/uL — ABNORMAL LOW (ref 150–440)
RBC: 2.3 MIL/uL — AB (ref 3.80–5.20)
RDW: 17.2 % — ABNORMAL HIGH (ref 11.5–14.5)
WBC: 1.4 10*3/uL — CL (ref 3.6–11.0)

## 2017-10-16 LAB — SAMPLE TO BLOOD BANK

## 2017-10-16 LAB — PREPARE RBC (CROSSMATCH)

## 2017-10-16 MED ORDER — SODIUM CHLORIDE 0.9% FLUSH
10.0000 mL | INTRAVENOUS | Status: DC | PRN
  Filled 2017-10-16: qty 10

## 2017-10-16 MED ORDER — DIPHENHYDRAMINE HCL 50 MG/ML IJ SOLN
25.0000 mg | Freq: Once | INTRAMUSCULAR | Status: AC
  Administered 2017-10-16: 25 mg via INTRAVENOUS
  Filled 2017-10-16: qty 1

## 2017-10-16 MED ORDER — ACETAMINOPHEN 325 MG PO TABS
650.0000 mg | ORAL_TABLET | Freq: Once | ORAL | Status: AC
  Administered 2017-10-16: 650 mg via ORAL
  Filled 2017-10-16: qty 2

## 2017-10-16 MED ORDER — SODIUM CHLORIDE 0.9 % IV SOLN
250.0000 mL | Freq: Once | INTRAVENOUS | Status: AC
  Administered 2017-10-16: 250 mL via INTRAVENOUS
  Filled 2017-10-16: qty 250

## 2017-10-16 NOTE — Progress Notes (Signed)
Patient denies any concerns today.  

## 2017-10-17 LAB — TYPE AND SCREEN
ABO/RH(D): O POS
Antibody Screen: NEGATIVE
Unit division: 0

## 2017-10-17 LAB — BPAM RBC
BLOOD PRODUCT EXPIRATION DATE: 201812042359
ISSUE DATE / TIME: 201811291109
Unit Type and Rh: 5100

## 2017-10-29 NOTE — Progress Notes (Addendum)
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  INTERVAL HISTORY: Patient returns to clinic today, accompanied by her daughter, for laboratory work, discussion of her bone marrow biopsy results, and consideration of a transfusion. She feels improved after receiving transfusions, but continues to have increased weakness and fatigue. She has had no further falls. She has no neurologic complaints. She denies any recent fevers, chills, night sweats, or weight loss.  She denies any chest pain or shortness of breath. She has no nausea, vomiting, constipation, or diarrhea. She has no melena or hematochezia. She has no urinary complaints.  Patient offers no further specific complaints today.  REVIEW OF SYSTEMS:   Review of Systems  Constitutional: Positive for malaise/fatigue. Negative for fever and weight loss.  Eyes: Negative for blurred vision, double vision and pain.  Respiratory: Negative.  Negative for cough and shortness of breath.   Cardiovascular: Negative.  Negative for chest pain and leg swelling.  Gastrointestinal: Negative.  Negative for abdominal pain, blood in stool, constipation, diarrhea, melena, nausea and vomiting.  Genitourinary: Negative.   Musculoskeletal: Negative.  Negative for falls and joint pain.  Skin: Negative.  Negative for rash.  Neurological: Positive for weakness. Negative for dizziness and sensory change.  Psychiatric/Behavioral: Positive for memory loss. The patient is not nervous/anxious and does not have insomnia.     As per HPI. Otherwise, a complete review of systems is negative.  PAST MEDICAL HISTORY: Past Medical History:  Diagnosis Date  . Breast cancer ([place])    mastectomy  . COPD (chronic obstructive pulmonary  disease) ([place])   . Heart murmur   . Hiatal hernia   . Hypertension   . UTI (lower urinary tract infection)   . UTI (urinary tract infection)    frequent in past    PAST SURGICAL HISTORY: Past Surgical History:  Procedure Laterality Date  . ABDOMINAL HYSTERECTOMY    . FOOT SURGERY     for hammertoe  . MASTECTOMY     modified radical in August 1996    FAMILY HISTORY Family History  Problem Relation Age of Onset  . Cancer Brother        lung  . Hypertension Brother   . Anemia Brother   . Myelodysplastic syndrome Brother        ADVANCED DIRECTIVES:    HEALTH MAINTENANCE: Social History   Tobacco Use  . Smoking status: Current Some Day Smoker    Packs/day: 0.25    Types: Cigarettes  . Smokeless tobacco: Never Used  Substance Use Topics  . Alcohol use: No  . Drug use: No     Colonoscopy:  PAP:  Bone density:  Lipid panel:  Allergies  Allergen Reactions  . Nitrofurantoin Other (See Comments)  . Penicillins Itching  . Sulfa Antibiotics Itching    Current Outpatient Medications  Medication Sig Dispense Refill  . acetaminophen (RA ACETAMINOPHEN) 650 MG CR tablet Take 650 mg by mouth every 8 (eight) hours as needed.     Marland Kitchen alendronate (FOSAMAX) 70 MG tablet Take 70 mg by mouth once a week.   0  . Ascorbic Acid (VITAMIN C) 1000 MG tablet Take 1,000 mg by mouth daily.    Marland Kitchen aspirin 81 MG tablet Take 81 mg by mouth daily.    . calcium-vitamin D (OSCAL  WITH D) 250-125 MG-UNIT per tablet Take 1 tablet by mouth daily.    Marland Kitchen gabapentin (NEURONTIN) 100 MG capsule Take 1 capsule by mouth at bedtime.    . Multiple Vitamin (MULTIVITAMIN) tablet Take 1 tablet by mouth daily.    . psyllium (METAMUCIL) 58.6 % packet Take 1 packet by mouth daily.    . valsartan (DIOVAN) 40 MG tablet Take 40 mg by mouth daily.     No current facility-administered medications for this visit.     OBJECTIVE: Vitals:   10/30/17 0916  BP: (!) 151/70  Pulse: 73  Resp: 18  Temp: (!) 96.3 F  (35.7 C)     Body mass index is 19.5 kg/m.    ECOG FS:1 - Symptomatic but completely ambulatory  General: Well-developed, well-nourished, no acute distress. Eyes: Pink conjunctiva, anicteric sclera. Lungs: Clear to auscultation bilaterally. Heart: Regular rate and rhythm. No rubs, murmurs, or gallops. Abdomen: Soft, nontender, nondistended. No organomegaly noted, normoactive bowel sounds. Musculoskeletal: No edema, cyanosis, or clubbing. Neuro: Alert, answering all questions appropriately. Cranial nerves grossly intact. Skin: No rashes or petechiae noted. Psych: Normal affect.  LAB RESULTS:  Lab Results  Component Value Date   NA 129 (L) 06/10/2014   K 4.3 06/10/2014   CL 96 (L) 06/10/2014   CO2 23 06/10/2014   GLUCOSE 140 (H) 06/10/2014   BUN 22 (H) 06/10/2014   CREATININE 1.24 06/10/2014   CALCIUM 10.3 (H) 06/10/2014   PROT 7.1 06/10/2014   ALBUMIN 2.3 (L) 06/10/2014   AST 11 (L) 06/10/2014   ALT 13 (L) 06/10/2014   ALKPHOS 70 06/10/2014   BILITOT 0.4 06/10/2014   GFRNONAA 40 (L) 06/10/2014   GFRAA 46 (L) 06/10/2014    Lab Results  Component Value Date   WBC 1.4 (LL) 10/30/2017   NEUTROABS 0.3 (L) 10/30/2017   HGB 7.9 (L) 10/30/2017   HCT 23.2 (L) 10/30/2017   MCV 97.5 10/30/2017   PLT 80 (L) 10/30/2017   Lab Results  Component Value Date   FERRITIN 595 (H) 10/01/2017   Lab Results  Component Value Date   IRON 214 (H) 10/01/2017   TIBC 256 10/01/2017   IRONPCTSAT 83 (H) 10/01/2017      STUDIES: Ct Biopsy  Result Date: 2017/11/04 CLINICAL DATA:  Refractory anemia requiring transfusion and leukopenia. EXAM: CT GUIDED BONE MARROW ASPIRATION AND BIOPSY ANESTHESIA/SEDATION: Versed 2.0 mg IV, Fentanyl 50 mcg IV Total Moderate Sedation Time:  20 minutes. The patient's level of consciousness and physiologic status were continuously monitored during the procedure by Radiology nursing. PROCEDURE: The procedure risks, benefits, and alternatives were explained to  the patient. Questions regarding the procedure were encouraged and answered. The patient understands and consents to the procedure. A time out was performed prior to initiating the procedure. The right gluteal region was prepped with chlorhexidine. Sterile gown and sterile gloves were used for the procedure. Local anesthesia was provided with 1% Lidocaine. Under CT guidance, an 11 gauge On Control bone cutting needle was advanced from a posterior approach into the right iliac bone. Needle positioning was confirmed with CT. Initial non heparinized and heparinized aspirate samples were obtained of bone marrow. Core biopsy was performed via the On Control drill needle. Three separate passes were made to obtain core biopsy samples. COMPLICATIONS: None FINDINGS: Inspection of initial aspirate did reveal visible particles. Intact core biopsy samples were obtained. IMPRESSION: CT guided bone marrow biopsy of right posterior iliac bone with both aspirate and core samples obtained. Electronically Signed  By: Aletta Edouard M.D.   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  PLAN:    1. 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  Being the sole abnormality, is usually associated with a better outcome.  Patient also noted increased blast count, but unclear how much.  She also was noted to have an increase in clonal plasma cells possibly indicating an additional plasma cell dyscrasia.  Continue supportive care as needed.  Proceed with 1 unit packed red blood cells today.  Return to clinic in 2 weeks for further evaluation and consideration of blood transfusion.   2.  History of breast cancer: No evidence of disease.  Patient's mammograms are ordered by her PCP. 3.  Hypertension: Blood pressure is within normal limits today. Continue treatment  per PCP. 4.  Leukopenia: Mild, monitor.  Bone marrow biopsy as above. 5.  Thrombocytopenia: Bone marrow biopsy as above. 6.  Elevated iron stores: Patient will require Desferal with transfusions.  Approximately 30 minutes was spent in discussion of which greater than 50% was consultation.  Patient expressed understanding and was in agreement with this plan. She also understands that She can call clinic at any time with any questions, concerns, or complaints.    [name], MD 11/02/17 9:27 AM

## 2017-10-30 ENCOUNTER — Inpatient Hospital Stay (HOSPITAL_BASED_OUTPATIENT_CLINIC_OR_DEPARTMENT_OTHER): Payer: Medicare Other | Admitting: Oncology

## 2017-10-30 ENCOUNTER — Inpatient Hospital Stay: Payer: Medicare Other | Attending: Oncology

## 2017-10-30 ENCOUNTER — Inpatient Hospital Stay: Payer: Medicare Other

## 2017-10-30 VITALS — BP 151/70 | HR 73 | Temp 96.3°F | Resp 18 | Wt 124.5 lb

## 2017-10-30 DIAGNOSIS — F17210 Nicotine dependence, cigarettes, uncomplicated: Secondary | ICD-10-CM | POA: Insufficient documentation

## 2017-10-30 DIAGNOSIS — Z808 Family history of malignant neoplasm of other organs or systems: Secondary | ICD-10-CM

## 2017-10-30 DIAGNOSIS — Z9013 Acquired absence of bilateral breasts and nipples: Secondary | ICD-10-CM | POA: Diagnosis not present

## 2017-10-30 DIAGNOSIS — R5383 Other fatigue: Secondary | ICD-10-CM | POA: Diagnosis not present

## 2017-10-30 DIAGNOSIS — D469 Myelodysplastic syndrome, unspecified: Secondary | ICD-10-CM

## 2017-10-30 DIAGNOSIS — Z87440 Personal history of urinary (tract) infections: Secondary | ICD-10-CM | POA: Diagnosis not present

## 2017-10-30 DIAGNOSIS — D72819 Decreased white blood cell count, unspecified: Secondary | ICD-10-CM | POA: Diagnosis not present

## 2017-10-30 DIAGNOSIS — K449 Diaphragmatic hernia without obstruction or gangrene: Secondary | ICD-10-CM

## 2017-10-30 DIAGNOSIS — D464 Refractory anemia, unspecified: Secondary | ICD-10-CM | POA: Diagnosis not present

## 2017-10-30 DIAGNOSIS — Z79899 Other long term (current) drug therapy: Secondary | ICD-10-CM | POA: Diagnosis not present

## 2017-10-30 DIAGNOSIS — Z9181 History of falling: Secondary | ICD-10-CM

## 2017-10-30 DIAGNOSIS — Z7982 Long term (current) use of aspirin: Secondary | ICD-10-CM

## 2017-10-30 DIAGNOSIS — D696 Thrombocytopenia, unspecified: Secondary | ICD-10-CM | POA: Diagnosis not present

## 2017-10-30 DIAGNOSIS — J449 Chronic obstructive pulmonary disease, unspecified: Secondary | ICD-10-CM

## 2017-10-30 DIAGNOSIS — I10 Essential (primary) hypertension: Secondary | ICD-10-CM | POA: Insufficient documentation

## 2017-10-30 DIAGNOSIS — Z801 Family history of malignant neoplasm of trachea, bronchus and lung: Secondary | ICD-10-CM | POA: Diagnosis not present

## 2017-10-30 DIAGNOSIS — R531 Weakness: Secondary | ICD-10-CM

## 2017-10-30 DIAGNOSIS — R011 Cardiac murmur, unspecified: Secondary | ICD-10-CM | POA: Insufficient documentation

## 2017-10-30 DIAGNOSIS — Z853 Personal history of malignant neoplasm of breast: Secondary | ICD-10-CM

## 2017-10-30 LAB — CBC WITH DIFFERENTIAL/PLATELET
BASOS ABS: 0 10*3/uL (ref 0–0.1)
Basophils Relative: 1 %
Eosinophils Absolute: 0 10*3/uL (ref 0–0.7)
Eosinophils Relative: 0 %
HCT: 23.2 % — ABNORMAL LOW (ref 35.0–47.0)
HEMOGLOBIN: 7.9 g/dL — AB (ref 12.0–16.0)
LYMPHS PCT: 70 %
Lymphs Abs: 1 10*3/uL (ref 1.0–3.6)
MCH: 33 pg (ref 26.0–34.0)
MCHC: 33.9 g/dL (ref 32.0–36.0)
MCV: 97.5 fL (ref 80.0–100.0)
MONOS PCT: 5 %
Monocytes Absolute: 0.1 10*3/uL — ABNORMAL LOW (ref 0.2–0.9)
Neutro Abs: 0.3 10*3/uL — ABNORMAL LOW (ref 1.7–7.7)
Neutrophils Relative %: 24 %
Platelets: 80 10*3/uL — ABNORMAL LOW (ref 150–440)
RBC: 2.38 MIL/uL — AB (ref 3.80–5.20)
RDW: 17.3 % — ABNORMAL HIGH (ref 11.5–14.5)
SMEAR REVIEW: DECREASED
WBC: 1.4 10*3/uL — AB (ref 4.0–10.5)

## 2017-10-30 LAB — PREPARE RBC (CROSSMATCH)

## 2017-10-30 LAB — SAMPLE TO BLOOD BANK

## 2017-10-30 MED ORDER — SODIUM CHLORIDE 0.9 % IV SOLN
250.0000 mL | Freq: Once | INTRAVENOUS | Status: AC
  Administered 2017-10-30: 250 mL via INTRAVENOUS
  Filled 2017-10-30: qty 250

## 2017-10-30 MED ORDER — DIPHENHYDRAMINE HCL 50 MG/ML IJ SOLN
25.0000 mg | Freq: Once | INTRAMUSCULAR | Status: AC
  Administered 2017-10-30: 25 mg via INTRAVENOUS
  Filled 2017-10-30: qty 1

## 2017-10-30 MED ORDER — ACETAMINOPHEN 325 MG PO TABS
650.0000 mg | ORAL_TABLET | Freq: Once | ORAL | Status: AC
  Administered 2017-10-30: 650 mg via ORAL
  Filled 2017-10-30: qty 2

## 2017-10-31 LAB — TYPE AND SCREEN
ABO/RH(D): O POS
ANTIBODY SCREEN: NEGATIVE
UNIT DIVISION: 0

## 2017-10-31 LAB — BPAM RBC
Blood Product Expiration Date: 201812192359
ISSUE DATE / TIME: 201812131152
UNIT TYPE AND RH: 9500

## 2017-11-06 ENCOUNTER — Encounter (HOSPITAL_COMMUNITY): Payer: Self-pay

## 2017-11-06 LAB — TISSUE HYBRIDIZATION (BONE MARROW)-NCBH

## 2017-11-06 LAB — CHROMOSOME ANALYSIS, BONE MARROW

## 2017-11-12 ENCOUNTER — Inpatient Hospital Stay: Payer: Medicare Other

## 2017-11-12 DIAGNOSIS — D464 Refractory anemia, unspecified: Secondary | ICD-10-CM

## 2017-11-12 DIAGNOSIS — D72819 Decreased white blood cell count, unspecified: Secondary | ICD-10-CM | POA: Diagnosis not present

## 2017-11-12 DIAGNOSIS — D696 Thrombocytopenia, unspecified: Secondary | ICD-10-CM | POA: Diagnosis not present

## 2017-11-12 DIAGNOSIS — R531 Weakness: Secondary | ICD-10-CM | POA: Diagnosis not present

## 2017-11-12 DIAGNOSIS — Z853 Personal history of malignant neoplasm of breast: Secondary | ICD-10-CM | POA: Diagnosis not present

## 2017-11-12 DIAGNOSIS — D469 Myelodysplastic syndrome, unspecified: Secondary | ICD-10-CM | POA: Diagnosis not present

## 2017-11-12 LAB — CBC WITH DIFFERENTIAL/PLATELET
BASOS PCT: 0 %
Basophils Absolute: 0 10*3/uL (ref 0–0.1)
EOS ABS: 0 10*3/uL (ref 0–0.7)
EOS PCT: 1 %
HCT: 26.6 % — ABNORMAL LOW (ref 35.0–47.0)
Hemoglobin: 8.9 g/dL — ABNORMAL LOW (ref 12.0–16.0)
LYMPHS ABS: 1.2 10*3/uL (ref 1.0–3.6)
Lymphocytes Relative: 66 %
MCH: 31.6 pg (ref 26.0–34.0)
MCHC: 33.4 g/dL (ref 32.0–36.0)
MCV: 94.5 fL (ref 80.0–100.0)
MONO ABS: 0.1 10*3/uL — AB (ref 0.2–0.9)
Monocytes Relative: 6 %
NEUTROS PCT: 27 %
Neutro Abs: 0.5 10*3/uL — ABNORMAL LOW (ref 1.4–6.5)
PLATELETS: 97 10*3/uL — AB (ref 150–440)
RBC: 2.81 MIL/uL — AB (ref 3.80–5.20)
RDW: 17.5 % — ABNORMAL HIGH (ref 11.5–14.5)
WBC: 1.8 10*3/uL — AB (ref 3.6–11.0)

## 2017-11-12 LAB — SAMPLE TO BLOOD BANK

## 2017-11-13 ENCOUNTER — Other Ambulatory Visit: Payer: Self-pay

## 2017-11-13 ENCOUNTER — Ambulatory Visit: Payer: Self-pay | Admitting: Oncology

## 2017-11-13 ENCOUNTER — Telehealth: Payer: Self-pay | Admitting: *Deleted

## 2017-11-13 ENCOUNTER — Ambulatory Visit: Payer: Self-pay

## 2017-11-13 NOTE — Telephone Encounter (Signed)
Patients daughter notified of hgb results from this week, hgb 8.9. Patient does not need blood transfusion tomorrow. Per Dr. Grayland Ormond appointments for tomorrow can be cancelled and moved out by 2 weeks. Patients daughter in agreement and will get updated appointments for lab/see MD/possible blood transfusion in 2 weeks.

## 2017-11-13 NOTE — Progress Notes (Deleted)
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  INTERVAL HISTORY: Patient returns to clinic today, accompanied by her daughter, for laboratory work, discussion of her bone marrow biopsy results, and consideration of a transfusion. She feels improved after receiving transfusions, but continues to have increased weakness and fatigue. She has had no further falls. She has no neurologic complaints. She denies any recent fevers, chills, night sweats, or weight loss.  She denies any chest pain or shortness of breath. She has no nausea, vomiting, constipation, or diarrhea. She has no melena or hematochezia. She has no urinary complaints.  Patient offers no further specific complaints today.  REVIEW OF SYSTEMS:   Review of Systems  Constitutional: Positive for malaise/fatigue. Negative for fever and weight loss.  Eyes: Negative for blurred vision, double vision and pain.  Respiratory: Negative.  Negative for cough and shortness of breath.   Cardiovascular: Negative.  Negative for chest pain and leg swelling.  Gastrointestinal: Negative.  Negative for abdominal pain, blood in stool, constipation, diarrhea, melena, nausea and vomiting.  Genitourinary: Negative.   Musculoskeletal: Negative.  Negative for falls and joint pain.  Skin: Negative.  Negative for rash.  Neurological: Positive for weakness. Negative for dizziness and sensory change.  Psychiatric/Behavioral: Positive for memory loss. The patient is not nervous/anxious and does not have insomnia.     As per HPI. Otherwise, a complete review of systems is negative.  PAST MEDICAL HISTORY: Past Medical History:  Diagnosis Date  . Breast cancer ([place])    mastectomy  . COPD (chronic obstructive pulmonary  disease) ([place])   . Heart murmur   . Hiatal hernia   . Hypertension   . UTI (lower urinary tract infection)   . UTI (urinary tract infection)    frequent in past    PAST SURGICAL HISTORY: Past Surgical History:  Procedure Laterality Date  . ABDOMINAL HYSTERECTOMY    . FOOT SURGERY     for hammertoe  . MASTECTOMY     modified radical in August 1996    FAMILY HISTORY Family History  Problem Relation Age of Onset  . Cancer Brother        lung  . Hypertension Brother   . Anemia Brother   . Myelodysplastic syndrome Brother        ADVANCED DIRECTIVES:    HEALTH MAINTENANCE: Social History   Tobacco Use  . Smoking status: Current Some Day Smoker    Packs/day: 0.25    Types: Cigarettes  . Smokeless tobacco: Never Used  Substance Use Topics  . Alcohol use: No  . Drug use: No     Colonoscopy:  PAP:  Bone density:  Lipid panel:  Allergies  Allergen Reactions  . Nitrofurantoin Other (See Comments)  . Penicillins Itching  . Sulfa Antibiotics Itching    Current Outpatient Medications  Medication Sig Dispense Refill  . acetaminophen (RA ACETAMINOPHEN) 650 MG CR tablet Take 650 mg by mouth every 8 (eight) hours as needed.     Marland Kitchen alendronate (FOSAMAX) 70 MG tablet Take 70 mg by mouth once a week.   0  . Ascorbic Acid (VITAMIN C) 1000 MG tablet Take 1,000 mg by mouth daily.    Marland Kitchen aspirin 81 MG tablet Take 81 mg by mouth daily.    . calcium-vitamin D (OSCAL  WITH D) 250-125 MG-UNIT per tablet Take 1 tablet by mouth daily.    Marland Kitchen gabapentin (NEURONTIN) 100 MG capsule Take 1 capsule by mouth at bedtime.    . Multiple Vitamin (MULTIVITAMIN) tablet Take 1 tablet by mouth daily.    . psyllium (METAMUCIL) 58.6 % packet Take 1 packet by mouth daily.    . valsartan (DIOVAN) 40 MG tablet Take 40 mg by mouth daily.     No current facility-administered medications for this visit.     OBJECTIVE: There were no vitals filed for this visit.   There is no height or weight on file to  calculate BMI.    ECOG FS:1 - Symptomatic but completely ambulatory  General: Well-developed, well-nourished, no acute distress. Eyes: Pink conjunctiva, anicteric sclera. Lungs: Clear to auscultation bilaterally. Heart: Regular rate and rhythm. No rubs, murmurs, or gallops. Abdomen: Soft, nontender, nondistended. No organomegaly noted, normoactive bowel sounds. Musculoskeletal: No edema, cyanosis, or clubbing. Neuro: Alert, answering all questions appropriately. Cranial nerves grossly intact. Skin: No rashes or petechiae noted. Psych: Normal affect.  LAB RESULTS:  Lab Results  Component Value Date   NA 129 (L) 06/10/2014   K 4.3 06/10/2014   CL 96 (L) 06/10/2014   CO2 23 06/10/2014   GLUCOSE 140 (H) 06/10/2014   BUN 22 (H) 06/10/2014   CREATININE 1.24 06/10/2014   CALCIUM 10.3 (H) 06/10/2014   PROT 7.1 06/10/2014   ALBUMIN 2.3 (L) 06/10/2014   AST 11 (L) 06/10/2014   ALT 13 (L) 06/10/2014   ALKPHOS 70 06/10/2014   BILITOT 0.4 06/10/2014   GFRNONAA 40 (L) 06/10/2014   GFRAA 46 (L) 06/10/2014    Lab Results  Component Value Date   WBC 1.8 (L) 11/12/2017   NEUTROABS 0.5 (L) 11/12/2017   HGB 8.9 (L) 11/12/2017   HCT 26.6 (L) 11/12/2017   MCV 94.5 11/12/2017   PLT 97 (L) 11/12/2017   Lab Results  Component Value Date   FERRITIN 595 (H) 10/01/2017   Lab Results  Component Value Date   IRON 214 (H) 10/01/2017   TIBC 256 10/01/2017   IRONPCTSAT 83 (H) 10/01/2017      STUDIES: No results found.  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  PLAN:    1. 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  Being the sole abnormality, is usually associated with a better outcome.  Patient also noted increased blast count, but unclear how much.  She also was noted to have an increase in clonal plasma cells possibly indicating an  additional plasma cell dyscrasia.  Continue supportive care as needed.  Proceed with 1 unit packed red blood cells today.  Return to clinic in 2 weeks for further evaluation and consideration of blood transfusion.   2.  History of breast cancer: No evidence of disease.  Patient's mammograms are ordered by her PCP. 3.  Hypertension: Blood pressure is within normal limits today. Continue treatment per PCP. 4.  Leukopenia: Mild, monitor.  Bone marrow biopsy as above. 5.  Thrombocytopenia: Bone marrow biopsy as above. 6.  Elevated iron stores: Patient will require Desferal with transfusions.  Approximately 30 minutes was spent in discussion of which greater than 50% was consultation.  Patient expressed understanding and was in agreement with this plan. She also understands that She can call clinic at any time with any questions, concerns, or complaints.  [name], MD 11/13/17 7:40 AM

## 2017-11-14 ENCOUNTER — Ambulatory Visit: Payer: Self-pay | Admitting: Oncology

## 2017-11-14 ENCOUNTER — Ambulatory Visit: Payer: Self-pay

## 2017-11-14 ENCOUNTER — Other Ambulatory Visit: Payer: Self-pay

## 2017-11-21 ENCOUNTER — Other Ambulatory Visit: Payer: Self-pay | Admitting: *Deleted

## 2017-11-21 ENCOUNTER — Telehealth: Payer: Self-pay | Admitting: *Deleted

## 2017-11-21 MED ORDER — BENZONATATE 100 MG PO CAPS: 100.0000 mg | ORAL_CAPSULE | Freq: Three times a day (TID) | ORAL | 0 refills | Status: DC | PRN

## 2017-11-21 MED ORDER — LEVOFLOXACIN 500 MG PO TABS: 500.0000 mg | ORAL_TABLET | Freq: Every day | ORAL | 0 refills | Status: DC

## 2017-11-21 NOTE — Telephone Encounter (Signed)
She wants cough medicine

## 2017-11-21 NOTE — Telephone Encounter (Signed)
RX for tessalon perles sent to pts pharmacy

## 2017-11-21 NOTE — Telephone Encounter (Signed)
Chelsea Horne notified of prescriptions sent in

## 2017-11-21 NOTE — Telephone Encounter (Signed)
Levaquin 500mg  daily  x7 days.  Thanks.

## 2017-11-21 NOTE — Telephone Encounter (Signed)
Daughter called asking that Dr Grayland Ormond call something in for cough for her mother. She has had cold like symptoms for a few days and she thinks she may be fevered as well. She called PCP who offered to see her at noon, but she states she cannot take her in at that time, so she is asking that Dr Grayland Ormond call something in. She reports that patient is not eating much either. Please advise

## 2017-11-25 DIAGNOSIS — B351 Tinea unguium: Secondary | ICD-10-CM | POA: Diagnosis not present

## 2017-11-25 DIAGNOSIS — I70203 Unspecified atherosclerosis of native arteries of extremities, bilateral legs: Secondary | ICD-10-CM | POA: Diagnosis not present

## 2017-11-25 DIAGNOSIS — E1142 Type 2 diabetes mellitus with diabetic polyneuropathy: Secondary | ICD-10-CM | POA: Diagnosis not present

## 2017-11-25 DIAGNOSIS — E119 Type 2 diabetes mellitus without complications: Secondary | ICD-10-CM | POA: Diagnosis not present

## 2017-11-25 DIAGNOSIS — L608 Other nail disorders: Secondary | ICD-10-CM | POA: Diagnosis not present

## 2017-11-26 NOTE — Progress Notes (Signed)
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  INTERVAL HISTORY: Patient returns to clinic today for further evaluation and consideration of additional blood.  She continues to have increased weakness and fatigue, but otherwise feels well. She has had no further falls. She has no neurologic complaints. She denies any recent fevers, chills, night sweats, or weight loss.  She denies any chest pain or shortness of breath. She has no nausea, vomiting, constipation, or diarrhea. She has no melena or hematochezia. She has no urinary complaints.  Patient offers no further specific complaints today.  REVIEW OF SYSTEMS:   Review of Systems  Constitutional: Positive for malaise/fatigue. Negative for fever and weight loss.  Eyes: Negative for blurred vision, double vision and pain.  Respiratory: Negative.  Negative for cough and shortness of breath.   Cardiovascular: Negative.  Negative for chest pain and leg swelling.  Gastrointestinal: Negative.  Negative for abdominal pain, blood in stool, constipation, diarrhea, melena, nausea and vomiting.  Genitourinary: Negative.   Musculoskeletal: Negative.  Negative for falls and joint pain.  Skin: Negative.  Negative for rash.  Neurological: Positive for weakness. Negative for dizziness and sensory change.  Psychiatric/Behavioral: Positive for memory loss. The patient is not nervous/anxious and does not have insomnia.     As per HPI. Otherwise, a complete review of systems is negative.  PAST MEDICAL HISTORY: Past Medical History:  Diagnosis Date  . Breast cancer ([place])    mastectomy  . COPD (chronic obstructive pulmonary disease) ([place])   . Heart murmur    . Hiatal hernia   . Hypertension   . UTI (lower urinary tract infection)   . UTI (urinary tract infection)    frequent in past    PAST SURGICAL HISTORY: Past Surgical History:  Procedure Laterality Date  . ABDOMINAL HYSTERECTOMY    . FOOT SURGERY     for hammertoe  . MASTECTOMY     modified radical in August 1996    FAMILY HISTORY Family History  Problem Relation Age of Onset  . Cancer Brother        lung  . Hypertension Brother   . Anemia Brother   . Myelodysplastic syndrome Brother        ADVANCED DIRECTIVES:    HEALTH MAINTENANCE: Social History   Tobacco Use  . Smoking status: Current Some Day Smoker    Packs/day: 0.25    Types: Cigarettes  . Smokeless tobacco: Never Used  Substance Use Topics  . Alcohol use: No  . Drug use: No     Colonoscopy:  PAP:  Bone density:  Lipid panel:  Allergies  Allergen Reactions  . Nitrofurantoin Other (See Comments)  . Penicillins Itching  . Sulfa Antibiotics Itching    Current Outpatient Medications  Medication Sig Dispense Refill  . acetaminophen (RA ACETAMINOPHEN) 650 MG CR tablet Take 650 mg by mouth every 8 (eight) hours as needed.     Marland Kitchen alendronate (FOSAMAX) 70 MG tablet Take 70 mg by mouth once a week.   0  . Ascorbic Acid (VITAMIN C) 1000 MG tablet Take 1,000 mg by mouth daily.    Marland Kitchen aspirin 81 MG tablet Take 81 mg by mouth daily.    . benzonatate (TESSALON) 100 MG capsule Take  1 capsule (100 mg total) by mouth 3 (three) times daily as needed for cough. 30 capsule 0  . calcium-vitamin D (OSCAL WITH D) 250-125 MG-UNIT per tablet Take 1 tablet by mouth daily.    Marland Kitchen gabapentin (NEURONTIN) 100 MG capsule Take 1 capsule by mouth at bedtime.    Marland Kitchen levofloxacin (LEVAQUIN) 500 MG tablet Take 1 tablet (500 mg total) by mouth daily. 7 tablet 0  . Multiple Vitamin (MULTIVITAMIN) tablet Take 1 tablet by mouth daily.    . psyllium (METAMUCIL) 58.6 % packet Take 1 packet by mouth daily.    . valsartan (DIOVAN) 40 MG  tablet Take 40 mg by mouth daily.     No current facility-administered medications for this visit.     OBJECTIVE: Vitals:   11/27/17 1054  BP: (!) 129/57  Pulse: 67  Resp: 20  Temp: (!) 96.3 F (35.7 C)     Body mass index is 18.81 kg/m.    ECOG FS:2 - Symptomatic, <50% confined to bed  General: Well-developed, well-nourished, no acute distress. Eyes: Pink conjunctiva, anicteric sclera. Lungs: Clear to auscultation bilaterally. Heart: Regular rate and rhythm. No rubs, murmurs, or gallops. Abdomen: Soft, nontender, nondistended. No organomegaly noted, normoactive bowel sounds. Musculoskeletal: No edema, cyanosis, or clubbing. Neuro: Alert, answering all questions appropriately. Cranial nerves grossly intact. Skin: No rashes or petechiae noted. Psych: Normal affect.  LAB RESULTS:  Lab Results  Component Value Date   NA 129 (L) 06/10/2014   K 4.3 06/10/2014   CL 96 (L) 06/10/2014   CO2 23 06/10/2014   GLUCOSE 140 (H) 06/10/2014   BUN 22 (H) 06/10/2014   CREATININE 1.24 06/10/2014   CALCIUM 10.3 (H) 06/10/2014   PROT 7.1 06/10/2014   ALBUMIN 2.3 (L) 06/10/2014   AST 11 (L) 06/10/2014   ALT 13 (L) 06/10/2014   ALKPHOS 70 06/10/2014   BILITOT 0.4 06/10/2014   GFRNONAA 40 (L) 06/10/2014   GFRAA 46 (L) 06/10/2014    Lab Results  Component Value Date   WBC 2.0 (L) 11/27/2017   NEUTROABS 0.6 (L) 11/27/2017   HGB 6.2 (L) 11/27/2017   HCT 18.4 (L) 11/27/2017   MCV 93.4 11/27/2017   PLT 232 11/27/2017   Lab Results  Component Value Date   FERRITIN 595 (H) 10/01/2017   Lab Results  Component Value Date   IRON 214 (H) 10/01/2017   TIBC 256 10/01/2017   IRONPCTSAT 83 (H) 10/01/2017      STUDIES: No results found.  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  PLAN:    1. 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  Cytogenetics revealed 20q- which is commonly associated with MDS as well as polycythemia  vera.  Being the sole abnormality, is usually associated with a better outcome.  Patient also noted increased blast count, but unclear how much.  She also was noted to have an increase in clonal plasma cells possibly indicating an additional plasma cell dyscrasia.  Continue supportive care as needed.  Proceed with 2 units packed red blood cells today.  Return to clinic in 1 week for further evaluation and consideration of blood transfusion.   2.  History of breast cancer: No evidence of disease.  Patient's mammograms are ordered by her PCP. 3.  Hypertension: Blood pressure is within normal limits today. Continue treatment per PCP. 4.  Leukopenia: Mild, monitor.  Bone marrow biopsy as above. 5.  Thrombocytopenia: Bone marrow biopsy as above. 6.  Elevated iron stores: Patient will require Desferal with transfusions.  Approximately 30 minutes was spent in discussion of which greater than 50% was consultation.  Patient expressed understanding and was in agreement with this plan. She also understands that She can call clinic at any time with any questions, concerns, or complaints.    [name], MD 11/30/17 9:56 AM

## 2017-11-27 ENCOUNTER — Inpatient Hospital Stay: Payer: Medicare Other

## 2017-11-27 ENCOUNTER — Inpatient Hospital Stay: Payer: Medicare Other | Attending: Oncology | Admitting: Oncology

## 2017-11-27 ENCOUNTER — Other Ambulatory Visit: Payer: Self-pay

## 2017-11-27 ENCOUNTER — Encounter: Payer: Self-pay | Admitting: Oncology

## 2017-11-27 VITALS — BP 129/57 | HR 67 | Temp 96.3°F | Resp 20 | Wt 120.1 lb

## 2017-11-27 DIAGNOSIS — D469 Myelodysplastic syndrome, unspecified: Secondary | ICD-10-CM | POA: Insufficient documentation

## 2017-11-27 DIAGNOSIS — K449 Diaphragmatic hernia without obstruction or gangrene: Secondary | ICD-10-CM | POA: Diagnosis not present

## 2017-11-27 DIAGNOSIS — R531 Weakness: Secondary | ICD-10-CM | POA: Diagnosis not present

## 2017-11-27 DIAGNOSIS — D696 Thrombocytopenia, unspecified: Secondary | ICD-10-CM | POA: Insufficient documentation

## 2017-11-27 DIAGNOSIS — Z853 Personal history of malignant neoplasm of breast: Secondary | ICD-10-CM | POA: Insufficient documentation

## 2017-11-27 DIAGNOSIS — D72819 Decreased white blood cell count, unspecified: Secondary | ICD-10-CM | POA: Diagnosis not present

## 2017-11-27 DIAGNOSIS — R5383 Other fatigue: Secondary | ICD-10-CM | POA: Insufficient documentation

## 2017-11-27 DIAGNOSIS — I10 Essential (primary) hypertension: Secondary | ICD-10-CM | POA: Insufficient documentation

## 2017-11-27 DIAGNOSIS — Z7982 Long term (current) use of aspirin: Secondary | ICD-10-CM | POA: Insufficient documentation

## 2017-11-27 DIAGNOSIS — J449 Chronic obstructive pulmonary disease, unspecified: Secondary | ICD-10-CM | POA: Insufficient documentation

## 2017-11-27 DIAGNOSIS — Z79899 Other long term (current) drug therapy: Secondary | ICD-10-CM | POA: Diagnosis not present

## 2017-11-27 DIAGNOSIS — D464 Refractory anemia, unspecified: Secondary | ICD-10-CM

## 2017-11-27 DIAGNOSIS — Z87440 Personal history of urinary (tract) infections: Secondary | ICD-10-CM | POA: Diagnosis not present

## 2017-11-27 DIAGNOSIS — Z9013 Acquired absence of bilateral breasts and nipples: Secondary | ICD-10-CM | POA: Diagnosis not present

## 2017-11-27 DIAGNOSIS — F17210 Nicotine dependence, cigarettes, uncomplicated: Secondary | ICD-10-CM | POA: Insufficient documentation

## 2017-11-27 DIAGNOSIS — Z801 Family history of malignant neoplasm of trachea, bronchus and lung: Secondary | ICD-10-CM | POA: Diagnosis not present

## 2017-11-27 DIAGNOSIS — I700 Atherosclerosis of aorta: Secondary | ICD-10-CM | POA: Insufficient documentation

## 2017-11-27 DIAGNOSIS — R011 Cardiac murmur, unspecified: Secondary | ICD-10-CM | POA: Diagnosis not present

## 2017-11-27 DIAGNOSIS — R630 Anorexia: Secondary | ICD-10-CM | POA: Diagnosis not present

## 2017-11-27 LAB — CBC WITH DIFFERENTIAL/PLATELET
BASOS ABS: 0 10*3/uL (ref 0–0.1)
BASOS PCT: 1 %
EOS PCT: 0 %
Eosinophils Absolute: 0 10*3/uL (ref 0–0.7)
HCT: 18.4 % — ABNORMAL LOW (ref 35.0–47.0)
Hemoglobin: 6.2 g/dL — ABNORMAL LOW (ref 12.0–16.0)
Lymphocytes Relative: 54 %
Lymphs Abs: 1.1 10*3/uL (ref 1.0–3.6)
MCH: 31.7 pg (ref 26.0–34.0)
MCHC: 33.9 g/dL (ref 32.0–36.0)
MCV: 93.4 fL (ref 80.0–100.0)
MONO ABS: 0.2 10*3/uL (ref 0.2–0.9)
Monocytes Relative: 12 %
Neutro Abs: 0.6 10*3/uL — ABNORMAL LOW (ref 1.4–6.5)
Neutrophils Relative %: 33 %
PLATELETS: 232 10*3/uL (ref 150–440)
RBC: 1.96 MIL/uL — AB (ref 3.80–5.20)
RDW: 18.5 % — AB (ref 11.5–14.5)
WBC: 2 10*3/uL — AB (ref 3.6–11.0)

## 2017-11-27 LAB — SAMPLE TO BLOOD BANK

## 2017-11-27 LAB — PREPARE RBC (CROSSMATCH)

## 2017-11-27 MED ORDER — ACETAMINOPHEN 325 MG PO TABS
650.0000 mg | ORAL_TABLET | Freq: Once | ORAL | Status: AC
  Administered 2017-11-27: 650 mg via ORAL
  Filled 2017-11-27: qty 2

## 2017-11-27 MED ORDER — SODIUM CHLORIDE 0.9 % IV SOLN
250.0000 mL | Freq: Once | INTRAVENOUS | Status: AC
  Administered 2017-11-27: 250 mL via INTRAVENOUS
  Filled 2017-11-27: qty 250

## 2017-11-27 MED ORDER — SODIUM CHLORIDE 0.9 % IV SOLN
Freq: Once | INTRAVENOUS | Status: AC
  Administered 2017-11-27: 12:00:00 via INTRAVENOUS
  Filled 2017-11-27: qty 2

## 2017-11-27 MED ORDER — DIPHENHYDRAMINE HCL 50 MG/ML IJ SOLN
25.0000 mg | Freq: Once | INTRAMUSCULAR | Status: AC
  Administered 2017-11-27: 25 mg via INTRAVENOUS
  Filled 2017-11-27: qty 1

## 2017-11-27 NOTE — Progress Notes (Signed)
Patient here today for follow up and possible blood transfusion. Patient reports she is getting over cold, has completed antibiotics.

## 2017-11-28 LAB — BPAM RBC
BLOOD PRODUCT EXPIRATION DATE: 201901162359
BLOOD PRODUCT EXPIRATION DATE: 201901172359
ISSUE DATE / TIME: 201901101358
ISSUE DATE / TIME: 201901101530
UNIT TYPE AND RH: 5100
Unit Type and Rh: 9500

## 2017-11-28 LAB — TYPE AND SCREEN
ABO/RH(D): O POS
Antibody Screen: NEGATIVE
UNIT DIVISION: 0
Unit division: 0

## 2017-11-30 NOTE — Progress Notes (Signed)
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  INTERVAL HISTORY: Patient returns to clinic today for further evaluation and consideration of additional blood.  She continues to have increased weakness and fatigue, but admits this is slightly improved after receiving 2 units of packed red blood cells last week. She has had no further falls. She has no neurologic complaints. She denies any recent fevers, chills, night sweats, or weight loss.  She denies any chest pain or shortness of breath. She has no nausea, vomiting, constipation, or diarrhea. She has no melena or hematochezia. She has no urinary complaints.  Patient offers no further specific complaints today.  REVIEW OF SYSTEMS:   Review of Systems  Constitutional: Positive for malaise/fatigue. Negative for fever and weight loss.  Eyes: Negative for blurred vision, double vision and pain.  Respiratory: Negative.  Negative for cough and shortness of breath.   Cardiovascular: Negative.  Negative for chest pain and leg swelling.  Gastrointestinal: Negative.  Negative for abdominal pain, blood in stool, constipation, diarrhea, melena, nausea and vomiting.  Genitourinary: Negative.   Musculoskeletal: Negative.  Negative for falls and joint pain.  Skin: Negative.  Negative for rash.  Neurological: Positive for weakness. Negative for dizziness and sensory change.  Psychiatric/Behavioral: Positive for memory loss. The patient is not nervous/anxious and does not have insomnia.     As per HPI. Otherwise, a complete review of systems is negative.  PAST MEDICAL HISTORY: Past Medical History:  Diagnosis Date  . Breast cancer ([place])    mastectomy    . COPD (chronic obstructive pulmonary disease) ([place])   . Heart murmur   . Hiatal hernia   . Hypertension   . UTI (lower urinary tract infection)   . UTI (urinary tract infection)    frequent in past    PAST SURGICAL HISTORY: Past Surgical History:  Procedure Laterality Date  . ABDOMINAL HYSTERECTOMY    . FOOT SURGERY     for hammertoe  . MASTECTOMY     modified radical in August 1996    FAMILY HISTORY Family History  Problem Relation Age of Onset  . Cancer Brother        lung  . Hypertension Brother   . Anemia Brother   . Myelodysplastic syndrome Brother        ADVANCED DIRECTIVES:    HEALTH MAINTENANCE: Social History   Tobacco Use  . Smoking status: Current Some Day Smoker    Packs/day: 0.25    Types: Cigarettes  . Smokeless tobacco: Never Used  Substance Use Topics  . Alcohol use: No  . Drug use: No     Colonoscopy:  PAP:  Bone density:  Lipid panel:  Allergies  Allergen Reactions  . Nitrofurantoin Other (See Comments)  . Penicillins Itching  . Sulfa Antibiotics Itching    Current Outpatient Medications  Medication Sig Dispense Refill  . acetaminophen (RA ACETAMINOPHEN) 650 MG CR tablet Take 650 mg by mouth every 8 (eight) hours as needed.     Marland Kitchen alendronate (FOSAMAX) 70 MG tablet Take 70 mg by mouth once a week. Saturaday  0  . Ascorbic Acid (VITAMIN C) 1000 MG tablet Take 1,000 mg by mouth daily.    Marland Kitchen aspirin 81 MG tablet Take 81  mg by mouth daily.    . calcium-vitamin D (OSCAL WITH D) 250-125 MG-UNIT per tablet Take 1 tablet by mouth daily.    Marland Kitchen gabapentin (NEURONTIN) 100 MG capsule Take 1 capsule by mouth at bedtime.    . Multiple Vitamin (MULTIVITAMIN) tablet Take 1 tablet by mouth daily.    . valsartan (DIOVAN) 40 MG tablet Take 40 mg by mouth daily.    . Multiple Minerals (JOINT HEALTH MINERAL PO) Take 1 tablet by mouth daily.    . psyllium (REGULOID) 0.52 g capsule Take 0.52 g by mouth daily. Metamucil     No current  facility-administered medications for this visit.     OBJECTIVE: Vitals:   12/04/17 1011  BP: 123/83  Pulse: 72  Resp: (!) 96  Temp: (!) 97.3 F (36.3 C)     Body mass index is 17.78 kg/m.    ECOG FS:2 - Symptomatic, <50% confined to bed  General: Well-developed, well-nourished, no acute distress. Eyes: Pink conjunctiva, anicteric sclera. Lungs: Clear to auscultation bilaterally. Heart: Regular rate and rhythm. No rubs, murmurs, or gallops. Abdomen: Soft, nontender, nondistended. No organomegaly noted, normoactive bowel sounds. Musculoskeletal: No edema, cyanosis, or clubbing. Neuro: Alert, answering all questions appropriately. Cranial nerves grossly intact. Skin: No rashes or petechiae noted. Psych: Normal affect.  LAB RESULTS:  Lab Results  Component Value Date   NA 129 (L) 06/10/2014   K 4.3 06/10/2014   CL 96 (L) 06/10/2014   CO2 23 06/10/2014   GLUCOSE 140 (H) 06/10/2014   BUN 22 (H) 06/10/2014   CREATININE 1.24 06/10/2014   CALCIUM 10.3 (H) 06/10/2014   PROT 7.1 06/10/2014   ALBUMIN 2.3 (L) 06/10/2014   AST 11 (L) 06/10/2014   ALT 13 (L) 06/10/2014   ALKPHOS 70 06/10/2014   BILITOT 0.4 06/10/2014   GFRNONAA 40 (L) 06/10/2014   GFRAA 46 (L) 06/10/2014    Lab Results  Component Value Date   WBC 1.7 (L) 12/04/2017   NEUTROABS 0.8 (L) 12/04/2017   HGB 8.8 (L) 12/04/2017   HCT 26.7 (L) 12/04/2017   MCV 93.4 12/04/2017   PLT 163 12/04/2017   Lab Results  Component Value Date   FERRITIN 595 (H) 10/01/2017   Lab Results  Component Value Date   IRON 214 (H) 10/01/2017   TIBC 256 10/01/2017   IRONPCTSAT 83 (H) 10/01/2017      STUDIES: No results found.  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  PLAN:    1. 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  Being the sole abnormality, is usually  associated with a better outcome.  Patient also noted increased blast count, but unclear how much.  She also was noted to have an increase in clonal plasma cells possibly indicating an additional plasma cell dyscrasia.  Continue supportive care as needed.  Patient does not require transfusion today, but will proceed with 40,000 units Procrit.  Return to clinic in 1 week with repeat laboratory work and consideration of transfusion or Procrit.  Patient will then return to clinic in 2 weeks for further evaluation.   2.  History of breast cancer: No evidence of disease.  Patient's mammograms are ordered by her PCP. 3.  Hypertension: Blood pressure is within normal limits today. Continue treatment per PCP. 4.  Leukopenia: Secondary to underlying MDS. Bone marrow biopsy as above. 5.  Thrombocytopenia: Patient's  platelet count is now within normal limits.  Monitor.   6.  Elevated iron stores: Patient will require Desferal with transfusions.  Approximately 30 minutes was spent in discussion of which greater than 50% was consultation.  Patient expressed understanding and was in agreement with this plan. She also understands that She can call clinic at any time with any questions, concerns, or complaints.    [name], MD 12/07/17 8:58 AM

## 2017-12-01 ENCOUNTER — Telehealth: Payer: Self-pay | Admitting: *Deleted

## 2017-12-01 NOTE — Telephone Encounter (Signed)
Per Dr Grayland Ormond and Tillie Rung , daughter did not want [name] ordered until she called back to let them know where to send it to .  Per daughter she wants physical therapy through [facility] and an aide to help with lunch, and bathe/ shower.

## 2017-12-01 NOTE — Telephone Encounter (Signed)
There is no appointment for port placement yet, I gave her number to [facility] to call and check on it. She also wantsa to know what was ordered for Home Health, physical therapy, OT, Nsg? Please return her call [phone]

## 2017-12-01 NOTE — Telephone Encounter (Signed)
Referral faxed

## 2017-12-03 ENCOUNTER — Other Ambulatory Visit: Payer: Self-pay | Admitting: *Deleted

## 2017-12-04 ENCOUNTER — Inpatient Hospital Stay: Payer: Medicare Other

## 2017-12-04 ENCOUNTER — Inpatient Hospital Stay (HOSPITAL_BASED_OUTPATIENT_CLINIC_OR_DEPARTMENT_OTHER): Payer: Medicare Other | Admitting: Oncology

## 2017-12-04 ENCOUNTER — Other Ambulatory Visit: Payer: Self-pay

## 2017-12-04 VITALS — BP 123/83 | HR 72 | Temp 97.3°F | Resp 96 | Wt 113.5 lb

## 2017-12-04 DIAGNOSIS — Z7982 Long term (current) use of aspirin: Secondary | ICD-10-CM | POA: Diagnosis not present

## 2017-12-04 DIAGNOSIS — Z801 Family history of malignant neoplasm of trachea, bronchus and lung: Secondary | ICD-10-CM

## 2017-12-04 DIAGNOSIS — D72819 Decreased white blood cell count, unspecified: Secondary | ICD-10-CM | POA: Diagnosis not present

## 2017-12-04 DIAGNOSIS — R531 Weakness: Secondary | ICD-10-CM | POA: Diagnosis not present

## 2017-12-04 DIAGNOSIS — F17210 Nicotine dependence, cigarettes, uncomplicated: Secondary | ICD-10-CM

## 2017-12-04 DIAGNOSIS — D469 Myelodysplastic syndrome, unspecified: Secondary | ICD-10-CM

## 2017-12-04 DIAGNOSIS — Z853 Personal history of malignant neoplasm of breast: Secondary | ICD-10-CM

## 2017-12-04 DIAGNOSIS — D696 Thrombocytopenia, unspecified: Secondary | ICD-10-CM | POA: Diagnosis not present

## 2017-12-04 DIAGNOSIS — R011 Cardiac murmur, unspecified: Secondary | ICD-10-CM

## 2017-12-04 DIAGNOSIS — J449 Chronic obstructive pulmonary disease, unspecified: Secondary | ICD-10-CM | POA: Diagnosis not present

## 2017-12-04 DIAGNOSIS — Z79899 Other long term (current) drug therapy: Secondary | ICD-10-CM

## 2017-12-04 DIAGNOSIS — Z87440 Personal history of urinary (tract) infections: Secondary | ICD-10-CM

## 2017-12-04 DIAGNOSIS — R5383 Other fatigue: Secondary | ICD-10-CM

## 2017-12-04 DIAGNOSIS — K449 Diaphragmatic hernia without obstruction or gangrene: Secondary | ICD-10-CM | POA: Diagnosis not present

## 2017-12-04 DIAGNOSIS — I10 Essential (primary) hypertension: Secondary | ICD-10-CM

## 2017-12-04 DIAGNOSIS — D464 Refractory anemia, unspecified: Secondary | ICD-10-CM

## 2017-12-04 DIAGNOSIS — Z9013 Acquired absence of bilateral breasts and nipples: Secondary | ICD-10-CM

## 2017-12-04 DIAGNOSIS — D638 Anemia in other chronic diseases classified elsewhere: Secondary | ICD-10-CM

## 2017-12-04 LAB — CBC WITH DIFFERENTIAL/PLATELET
BASOS PCT: 1 %
Basophils Absolute: 0 10*3/uL (ref 0–0.1)
Eosinophils Absolute: 0 10*3/uL (ref 0–0.7)
Eosinophils Relative: 0 %
HCT: 26.7 % — ABNORMAL LOW (ref 35.0–47.0)
Hemoglobin: 8.8 g/dL — ABNORMAL LOW (ref 12.0–16.0)
Lymphocytes Relative: 46 %
Lymphs Abs: 0.8 10*3/uL — ABNORMAL LOW (ref 1.0–3.6)
MCH: 30.9 pg (ref 26.0–34.0)
MCHC: 33 g/dL (ref 32.0–36.0)
MCV: 93.4 fL (ref 80.0–100.0)
MONO ABS: 0.1 10*3/uL — AB (ref 0.2–0.9)
MONOS PCT: 7 %
NEUTROS ABS: 0.8 10*3/uL — AB (ref 1.4–6.5)
Neutrophils Relative %: 46 %
Platelets: 163 10*3/uL (ref 150–440)
RBC: 2.85 MIL/uL — ABNORMAL LOW (ref 3.80–5.20)
RDW: 16.6 % — AB (ref 11.5–14.5)
WBC: 1.7 10*3/uL — ABNORMAL LOW (ref 3.6–11.0)

## 2017-12-04 LAB — SAMPLE TO BLOOD BANK

## 2017-12-04 MED ORDER — EPOETIN ALFA 40000 UNIT/ML IJ SOLN
40000.0000 [IU] | Freq: Once | INTRAMUSCULAR | Status: AC
  Administered 2017-12-04: 40000 [IU] via SUBCUTANEOUS
  Filled 2017-12-04: qty 1

## 2017-12-04 NOTE — Progress Notes (Signed)
Patient reports decreased appetite, energy and worsening fatigue.

## 2017-12-05 ENCOUNTER — Encounter: Payer: Self-pay | Admitting: Surgery

## 2017-12-05 ENCOUNTER — Ambulatory Visit (INDEPENDENT_AMBULATORY_CARE_PROVIDER_SITE_OTHER): Payer: Medicare Other | Admitting: Surgery

## 2017-12-05 ENCOUNTER — Telehealth: Payer: Self-pay | Admitting: Surgery

## 2017-12-05 VITALS — BP 115/67 | HR 76 | Temp 97.8°F | Wt 113.0 lb

## 2017-12-05 DIAGNOSIS — D469 Myelodysplastic syndrome, unspecified: Secondary | ICD-10-CM | POA: Diagnosis not present

## 2017-12-05 NOTE — Telephone Encounter (Signed)
I have called patient's daughter to go over surgery information. No answer. I have left a message on voicemail for daughter to call back.    Sx: 12/09/17 with Dr Diego Cory placement.  Pre op: No pre op needed.   Patient will need to call [phone], between 1-3:00pm the day before surgery, to find out what time to arrive.    Patient will need to be NPO after midnight. Patient can drink clear fluids (water, black coffee) till 9:30am. Patient will need to stop her asprin, joint health vitamin and vitamin C today.

## 2017-12-05 NOTE — Patient Instructions (Signed)
We have seen you today and have spoken about your port placement. This has been scheduled for 12/09/2017 at [facility] by Dr. Rosana Hoes.  Please see the [facility]) Sheet provided for further details.  Please call our office with any questions or concerns that you have.   [facility]) A central line is a soft, flexible tube (catheter) that can be used to collect blood for testing or to give medicine or nutrition through a vein. The tip of the central line ends in a large vein just above the heart called the vena cava. A central line may be placed because:  You need to get medicines or fluids through an IV tube for a long period of time.  You need nutrition but cannot eat or absorb nutrients.  The veins in your hands or arms are hard to access.  You need to have blood taken often for blood tests.  You need a blood transfusion  You need chemotherapy or dialysis.  There are many types of central lines:  Peripherally inserted central catheter (PICC) line. This type is used for intermediate access to long-term access of one week or more. It can be used to draw blood and give fluids or medicines. A PICC looks like an IV tube, but it goes up the arm to the heart. It is usually inserted in the upper arm and taped in place on the arm.  Tunneled central line. This type is used for long-term therapy and dialysis. It is placed in a large vein in the neck, chest, or groin. A tunneled central line is inserted through a small incision made over the vein and is advanced into the heart. It is tunneled beneath the skin and brought out through a second incision.  Non-tunneled central line. This type is used for short-term access, usually of a maximum of 7 days. It is often used in the emergency department. A non-tunneled central line is inserted in the neck, chest, or groin.  Implanted port. This type is used for long-term therapy. It can stay in place longer than other types of central lines. An  implanted port is normally inserted in the upper chest but can also be placed in the upper arm or in the abdomen. It is inserted and removed with surgery, and it is accessed using a special needle.  The type of central line that you receive depends on how long you will need it, your medical condition, and the condition of your veins. What are the risks? Using any type of central line has risks that you should be aware of, including:  Infection.  A blood clot that blocks the central line or forms in the vein and travels to the heart.  Bleeding from the place where the central line was put in.  Developing a hole or crack within the central line. If this happens, the central line will need to be replaced.  Developing an abnormal heart rhythm (arrhythmia). This is rare.  Central line failure.  Follow these instructions at home: Flushing and cleaning the central line  Follow instructions from the health care provider about flushing and cleaning the central line.  Wear a mask when flushing or cleaning the central line.  Before you flush or clean the central line: ? Wash your hands with soap and water. ? Clean the central line hub with rubbing alcohol. Insertion site care  Keep the insertion site of your central line clean and dry at all times.  Check your incision or central  line site every day for signs of infection. Check for: ? More redness, swelling, or pain. ? More fluid or blood. ? Warmth. ? Pus or a bad smell. General instructions  Follow instructions from your health care provider for the type of device that you have.  If the central line accidentally gets pulled on, make sure: ? The bandage (dressing) is okay. ? There is no bleeding. ? The line has not been pulled out.  Return to your normal activities as told by your health care provider. Ask your health care provider what activities are safe for you. You may be restricted from lifting or making repetitive arm  movements on the side with the catheter.  Do not swim or bathe unless your health care provider approves.  Keep your dressing dry. Your health care provider can instruct you about how to keep your specific type of dressing from getting wet.  Keep all follow-up visits as told by your health care provider. This is important. Contact a health care provider if:  You have more redness, swelling, or pain around your incision.  You have more fluid or blood coming from your incision.  Your incision feels warm to the touch.  You have pus or a bad smell coming from your incision. Get help right away if:  You have: ? Chills. ? A fever. ? Shortness of breath. ? Trouble breathing. ? Chest pain. ? Swelling in your neck, face, chest, or arm on the side of your central line.  You are coughing.  You feel your heart beating rapidly or skipping beats.  You feel dizzy or you faint.  Your incision or central line site has red streaks spreading away from the area.  Your incision or central line site is bleeding and does not stop.  Your central line is difficult to flush or will not flush.  You do not get a blood return from the central line.  Your central line gets loose or comes out.  Your central line gets damaged.  Your catheter leaks when flushed or when fluids are infused into it. This information is not intended to replace advice given to you by your health care provider. Make sure you discuss any questions you have with your health care provider. Document Released: 12/26/2005 Document Revised: 07/03/2016 Document Reviewed: 06/12/2016 Elsevier Interactive Patient Education  2017 [employer].

## 2017-12-05 NOTE — Progress Notes (Signed)
Surgical Clinic History and Physical  Referring provider:  Cletis Athens, MD [address] [place], [place] [address]  HISTORY OF PRESENT ILLNESS (HPI):  84 y.o. female presents upon referral from Dr. Grayland Ormond for placement of central venous catheter with subcutaneous port for durable central venous access. Patient reports she has required increasingly frequent blood transfusions for symptomatic anemia attributed to myelodysplastic syndrome. 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 She otherwise denies fever/chills, N/V, CP, or SOB, and says she experienced no pain with recent bone marrow biopsy. Patient also denies any prior indwelling central venous catheters or unilateral upper extremity swelling.  PAST MEDICAL HISTORY (PMH):  Past Medical History:  Diagnosis Date  . Breast cancer ([place])    mastectomy  . COPD (chronic obstructive pulmonary disease) ([place])   . Heart murmur   . Hiatal hernia   . Hypertension   . UTI (lower urinary tract infection)   . UTI (urinary tract infection)    frequent in past     PAST SURGICAL HISTORY ([place]):  Past Surgical History:  Procedure Laterality Date  . ABDOMINAL HYSTERECTOMY    . FOOT SURGERY     for hammertoe  . MASTECTOMY     modified radical in August 1996     MEDICATIONS:  Prior to Admission medications   Medication Sig Start Date End Date Taking? Authorizing Provider  acetaminophen (RA ACETAMINOPHEN) 650 MG CR tablet Take 650 mg by mouth every 8 (eight) hours as needed.     [provider]  alendronate (FOSAMAX) 70 MG tablet Take 70 mg by mouth once a week.  04/01/15   [provider]  Ascorbic Acid (VITAMIN C) 1000 MG tablet Take 1,000 mg by mouth daily.    [provider]  aspirin 81 MG tablet Take 81 mg by mouth daily.    [provider]  benzonatate (TESSALON) 100 MG capsule Take 1 capsule (100 mg total) by mouth 3  (three) times daily as needed for cough. 11/21/17   [name], MD  calcium-vitamin D (OSCAL WITH D) 250-125 MG-UNIT per tablet Take 1 tablet by mouth daily.    [provider]  gabapentin (NEURONTIN) 100 MG capsule Take 1 capsule by mouth at bedtime. 02/10/16   [provider]  levofloxacin (LEVAQUIN) 500 MG tablet Take 1 tablet (500 mg total) by mouth daily. 11/21/17   [name], MD  Multiple Vitamin (MULTIVITAMIN) tablet Take 1 tablet by mouth daily.    [provider]  psyllium (METAMUCIL) 58.6 % packet Take 1 packet by mouth daily.    [provider]  valsartan (DIOVAN) 40 MG tablet Take 40 mg by mouth daily.    [provider]     ALLERGIES:  Allergies  Allergen Reactions  . Nitrofurantoin Other (See Comments)  . Penicillins Itching  . Sulfa Antibiotics Itching     SOCIAL HISTORY:  Social History   Socioeconomic History  . Marital status: Widowed    Spouse name: Not on file  . Number of children: Not on file  . Years of education: Not on file  . Highest education level: Not on file  Social Needs  . Financial resource strain: Not on file  . Food insecurity - worry: Not on file  . Food insecurity - inability: Not on file  . Transportation needs - medical: Not on file  . Transportation needs - non-medical: Not on file  Occupational History  .  Not on file  Tobacco Use  . Smoking status: Current Some Day Smoker    Packs/day: 0.25    Types: Cigarettes  . Smokeless tobacco: Never Used  Substance and Sexual Activity  . Alcohol use: No  . Drug use: No  . Sexual activity: No  Other Topics Concern  . Not on file  Social History Narrative  . Not on file    The patient currently resides (home / rehab facility / nursing home): Home The patient normally is (ambulatory / bedbound): Ambulatory  FAMILY HISTORY:  Family History  Problem Relation Age of Onset  . Cancer Brother        lung  . Hypertension Brother   .  Anemia Brother   . Myelodysplastic syndrome Brother     Otherwise negative/non-contributory.  REVIEW OF SYSTEMS:  Constitutional: denies any other weight loss, fever, chills, or sweats  Eyes: denies any other vision changes, history of eye injury  ENT: denies sore throat, hearing problems  Respiratory: denies shortness of breath, wheezing  Cardiovascular: denies chest pain, palpitations  Gastrointestinal: denies abdominal pain, N/V, or diarrhea Musculoskeletal: denies any other joint pains or cramps  Skin: Denies any other rashes or skin discolorations except as per HPI Neurological: denies any other headache or dizziness Psychiatric: Denies any other depression, anxiety   All other review of systems were otherwise negative   VITAL SIGNS:  BP 115/67   Pulse 76   Temp 97.8 F (36.6 C) (Oral)   Wt 113 lb (51.3 kg)   BMI 17.70 kg/m   PHYSICAL EXAM:  Constitutional:  -- Normal body habitus  -- Awake, alert, and oriented x3  Eyes:  -- Pupils equally round and reactive to light  -- No scleral icterus  Ear, nose, throat:  -- No jugular venous distension -- No nasal drainage, bleeding Pulmonary:  -- No crackles  -- Equal breath sounds bilaterally -- Breathing non-labored at rest Cardiovascular:  -- S1, S2 present  -- No pericardial rubs  Gastrointestinal:  -- Abdomen soft, nontender, non-distended, no guarding/rebound  -- No abdominal masses appreciated, pulsatile or otherwise  Musculoskeletal and Integumentary:  -- Wounds or skin discoloration: None appreciated -- Extremities: B/L UE and LE FROM, hands and feet warm, no edema  Neurologic:  -- Motor function: Intact and symmetric -- Sensation: Intact and symmetric  Labs:  CBC Latest Ref Rng & Units 12/04/2017 11/27/2017 11/12/2017  WBC 3.6 - 11.0 K/uL 1.7(L) 2.0(L) 1.8(L)  Hemoglobin 12.0 - 16.0 g/dL 8.8(L) 6.2(L) 8.9(L)  Hematocrit 35.0 - 47.0 % 26.7(L) 18.4(L) 26.6(L)  Platelets 150 - 440 K/uL 163 232 97(L)   CMP  Latest Ref Rng & Units 06/10/2014 06/07/2014 06/06/2014  Glucose 65 - 99 mg/dL 140(H) 90 91  BUN 7 - 18 mg/dL 22(H) 12 15  Creatinine 0.60 - 1.30 mg/dL 1.24 0.97 1.04  Sodium 136 - 145 mmol/L 129(L) 131(L) 129(L)  Potassium 3.5 - 5.1 mmol/L 4.3 3.9 3.8  Chloride 98 - 107 mmol/L 96(L) 96(L) 94(L)  CO2 21 - 32 mmol/L '23 28 28  '$ Calcium 8.5 - 10.1 mg/dL 10.3(H) 9.2 9.0  Total Protein 6.4 - 8.2 g/dL 7.1 - -  Total Bilirubin 0.2 - 1.0 mg/dL 0.4 - -  Alkaline Phos Unit/L 70 - -  AST 15 - 37 Unit/L 11(L) - -  ALT U/L 13(L) - -   Assessment/Plan:  84 y.o. female with myelodysplastic syndrome requiring frequent blood transfusions and complicated by co-morbidities including HTN, COPD, advanced chronological age, and histories  of cardiac murmur, hiatal hernia, and breast cancer s/p Left mastectomy.   - monitor Hb and transfusion as per Dr. Grayland Ormond  - all risks, benefits, and alternatives to insertion of indwelling central venous catheter with subcutaneous port were discussed with the patient and her daughter, all of their questions were answered to their expressed satisfaction, patient expresses she wishes to proceed, and informed consent was obtained.  - will plan for insertion of central venous catheter with subcutaneous port next Tuesday afternoon, 1/22 with local anesthesia alone   - anticipate return to clinic 2 weeks following above procedure  - instructed to call if any questions or concerns  All of the above recommendations were discussed with the patient and patient's daughter, and all of patient's and family's questions were answered to their expressed satisfaction.  Thank you for the opportunity to participate in this patient's care.  -- Marilynne Drivers Rosana Hoes, MD, [place]: [facility] - Partnering for exceptional care. Office: [phone]

## 2017-12-05 NOTE — H&P (View-Only) (Signed)
Surgical Clinic History and Physical  Referring provider:  Cletis Athens, MD [address] [place], [place] [address]  HISTORY OF PRESENT ILLNESS (HPI):  84 y.o. female presents upon referral from Dr. Grayland Ormond for placement of central venous catheter with subcutaneous port for durable central venous access. Patient reports she has required increasingly frequent blood transfusions for symptomatic anemia attributed to myelodysplastic syndrome. 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 She otherwise denies fever/chills, N/V, CP, or SOB, and says she experienced no pain with recent bone marrow biopsy. Patient also denies any prior indwelling central venous catheters or unilateral upper extremity swelling.  PAST MEDICAL HISTORY (PMH):  Past Medical History:  Diagnosis Date  . Breast cancer ([place])    mastectomy  . COPD (chronic obstructive pulmonary disease) ([place])   . Heart murmur   . Hiatal hernia   . Hypertension   . UTI (lower urinary tract infection)   . UTI (urinary tract infection)    frequent in past     PAST SURGICAL HISTORY ([place]):  Past Surgical History:  Procedure Laterality Date  . ABDOMINAL HYSTERECTOMY    . FOOT SURGERY     for hammertoe  . MASTECTOMY     modified radical in August 1996     MEDICATIONS:  Prior to Admission medications   Medication Sig Start Date End Date Taking? Authorizing Provider  acetaminophen (RA ACETAMINOPHEN) 650 MG CR tablet Take 650 mg by mouth every 8 (eight) hours as needed.     [provider]  alendronate (FOSAMAX) 70 MG tablet Take 70 mg by mouth once a week.  04/01/15   [provider]  Ascorbic Acid (VITAMIN C) 1000 MG tablet Take 1,000 mg by mouth daily.    [provider]  aspirin 81 MG tablet Take 81 mg by mouth daily.    [provider]  benzonatate (TESSALON) 100 MG capsule Take 1 capsule (100 mg total) by mouth 3  (three) times daily as needed for cough. 11/21/17   [name], MD  calcium-vitamin D (OSCAL WITH D) 250-125 MG-UNIT per tablet Take 1 tablet by mouth daily.    [provider]  gabapentin (NEURONTIN) 100 MG capsule Take 1 capsule by mouth at bedtime. 02/10/16   [provider]  levofloxacin (LEVAQUIN) 500 MG tablet Take 1 tablet (500 mg total) by mouth daily. 11/21/17   [name], MD  Multiple Vitamin (MULTIVITAMIN) tablet Take 1 tablet by mouth daily.    [provider]  psyllium (METAMUCIL) 58.6 % packet Take 1 packet by mouth daily.    [provider]  valsartan (DIOVAN) 40 MG tablet Take 40 mg by mouth daily.    [provider]     ALLERGIES:  Allergies  Allergen Reactions  . Nitrofurantoin Other (See Comments)  . Penicillins Itching  . Sulfa Antibiotics Itching     SOCIAL HISTORY:  Social History   Socioeconomic History  . Marital status: Widowed    Spouse name: Not on file  . Number of children: Not on file  . Years of education: Not on file  . Highest education level: Not on file  Social Needs  . Financial resource strain: Not on file  . Food insecurity - worry: Not on file  . Food insecurity - inability: Not on file  . Transportation needs - medical: Not on file  . Transportation needs - non-medical: Not on file  Occupational History  .  Not on file  Tobacco Use  . Smoking status: Current Some Day Smoker    Packs/day: 0.25    Types: Cigarettes  . Smokeless tobacco: Never Used  Substance and Sexual Activity  . Alcohol use: No  . Drug use: No  . Sexual activity: No  Other Topics Concern  . Not on file  Social History Narrative  . Not on file    The patient currently resides (home / rehab facility / nursing home): Home The patient normally is (ambulatory / bedbound): Ambulatory  FAMILY HISTORY:  Family History  Problem Relation Age of Onset  . Cancer Brother        lung  . Hypertension Brother   .  Anemia Brother   . Myelodysplastic syndrome Brother     Otherwise negative/non-contributory.  REVIEW OF SYSTEMS:  Constitutional: denies any other weight loss, fever, chills, or sweats  Eyes: denies any other vision changes, history of eye injury  ENT: denies sore throat, hearing problems  Respiratory: denies shortness of breath, wheezing  Cardiovascular: denies chest pain, palpitations  Gastrointestinal: denies abdominal pain, N/V, or diarrhea Musculoskeletal: denies any other joint pains or cramps  Skin: Denies any other rashes or skin discolorations except as per HPI Neurological: denies any other headache or dizziness Psychiatric: Denies any other depression, anxiety   All other review of systems were otherwise negative   VITAL SIGNS:  BP 115/67   Pulse 76   Temp 97.8 F (36.6 C) (Oral)   Wt 113 lb (51.3 kg)   BMI 17.70 kg/m   PHYSICAL EXAM:  Constitutional:  -- Normal body habitus  -- Awake, alert, and oriented x3  Eyes:  -- Pupils equally round and reactive to light  -- No scleral icterus  Ear, nose, throat:  -- No jugular venous distension -- No nasal drainage, bleeding Pulmonary:  -- No crackles  -- Equal breath sounds bilaterally -- Breathing non-labored at rest Cardiovascular:  -- S1, S2 present  -- No pericardial rubs  Gastrointestinal:  -- Abdomen soft, nontender, non-distended, no guarding/rebound  -- No abdominal masses appreciated, pulsatile or otherwise  Musculoskeletal and Integumentary:  -- Wounds or skin discoloration: None appreciated -- Extremities: B/L UE and LE FROM, hands and feet warm, no edema  Neurologic:  -- Motor function: Intact and symmetric -- Sensation: Intact and symmetric  Labs:  CBC Latest Ref Rng & Units 12/04/2017 11/27/2017 11/12/2017  WBC 3.6 - 11.0 K/uL 1.7(L) 2.0(L) 1.8(L)  Hemoglobin 12.0 - 16.0 g/dL 8.8(L) 6.2(L) 8.9(L)  Hematocrit 35.0 - 47.0 % 26.7(L) 18.4(L) 26.6(L)  Platelets 150 - 440 K/uL 163 232 97(L)   CMP  Latest Ref Rng & Units 06/10/2014 06/07/2014 06/06/2014  Glucose 65 - 99 mg/dL 140(H) 90 91  BUN 7 - 18 mg/dL 22(H) 12 15  Creatinine 0.60 - 1.30 mg/dL 1.24 0.97 1.04  Sodium 136 - 145 mmol/L 129(L) 131(L) 129(L)  Potassium 3.5 - 5.1 mmol/L 4.3 3.9 3.8  Chloride 98 - 107 mmol/L 96(L) 96(L) 94(L)  CO2 21 - 32 mmol/L '23 28 28  '$ Calcium 8.5 - 10.1 mg/dL 10.3(H) 9.2 9.0  Total Protein 6.4 - 8.2 g/dL 7.1 - -  Total Bilirubin 0.2 - 1.0 mg/dL 0.4 - -  Alkaline Phos Unit/L 70 - -  AST 15 - 37 Unit/L 11(L) - -  ALT U/L 13(L) - -   Assessment/Plan:  84 y.o. female with myelodysplastic syndrome requiring frequent blood transfusions and complicated by co-morbidities including HTN, COPD, advanced chronological age, and histories  of cardiac murmur, hiatal hernia, and breast cancer s/p Left mastectomy.   - monitor Hb and transfusion as per Dr. Grayland Ormond  - all risks, benefits, and alternatives to insertion of indwelling central venous catheter with subcutaneous port were discussed with the patient and her daughter, all of their questions were answered to their expressed satisfaction, patient expresses she wishes to proceed, and informed consent was obtained.  - will plan for insertion of central venous catheter with subcutaneous port next Tuesday afternoon, 1/22 with local anesthesia alone   - anticipate return to clinic 2 weeks following above procedure  - instructed to call if any questions or concerns  All of the above recommendations were discussed with the patient and patient's daughter, and all of patient's and family's questions were answered to their expressed satisfaction.  Thank you for the opportunity to participate in this patient's care.  -- Marilynne Drivers Rosana Hoes, MD, [place]: [facility] - Partnering for exceptional care. Office: [phone]

## 2017-12-05 NOTE — Telephone Encounter (Signed)
Daughter has called back and understands all directions.

## 2017-12-08 ENCOUNTER — Telehealth: Payer: Self-pay | Admitting: Surgery

## 2017-12-08 DIAGNOSIS — I10 Essential (primary) hypertension: Secondary | ICD-10-CM | POA: Diagnosis not present

## 2017-12-08 DIAGNOSIS — Z853 Personal history of malignant neoplasm of breast: Secondary | ICD-10-CM | POA: Diagnosis not present

## 2017-12-08 DIAGNOSIS — F17210 Nicotine dependence, cigarettes, uncomplicated: Secondary | ICD-10-CM | POA: Diagnosis not present

## 2017-12-08 DIAGNOSIS — J449 Chronic obstructive pulmonary disease, unspecified: Secondary | ICD-10-CM | POA: Diagnosis not present

## 2017-12-08 DIAGNOSIS — D469 Myelodysplastic syndrome, unspecified: Secondary | ICD-10-CM | POA: Diagnosis not present

## 2017-12-08 DIAGNOSIS — K449 Diaphragmatic hernia without obstruction or gangrene: Secondary | ICD-10-CM | POA: Diagnosis not present

## 2017-12-08 MED ORDER — VANCOMYCIN HCL IN DEXTROSE 1-5 GM/200ML-% IV SOLN
1000.0000 mg | INTRAVENOUS | Status: AC
  Administered 2017-12-09: 1000 mg via INTRAVENOUS

## 2017-12-08 NOTE — Telephone Encounter (Signed)
Marsha with pre-admit testing called said that patient is having a Port a cath put in tomorrow needs some pre-op orders entered any questions  she can be reached at [phone].

## 2017-12-08 NOTE — Telephone Encounter (Signed)
Spoke with Dr. Rosana Hoes he verbalized understanding and will place these orders.

## 2017-12-08 NOTE — Pre-Procedure Instructions (Signed)
Dr. Rosana Hoes' office called to request pre-op orders, message left on voicemail.

## 2017-12-08 NOTE — Pre-Procedure Instructions (Signed)
Pt is an add on, no pre-op appointment, pt recently inpt 12/04/2017.  EKG being faxed from Dr. Jennette Kettle office, see pt's chart.

## 2017-12-09 ENCOUNTER — Ambulatory Visit: Payer: Medicare Other

## 2017-12-09 ENCOUNTER — Encounter
Admission: RE | Disposition: A | Discharge status: Home / Self Care | Payer: Self-pay | Source: Ambulatory Visit | Attending: Surgery

## 2017-12-09 ENCOUNTER — Other Ambulatory Visit: Payer: Self-pay

## 2017-12-09 ENCOUNTER — Encounter: Payer: Self-pay | Admitting: *Deleted

## 2017-12-09 ENCOUNTER — Ambulatory Visit
Admission: RE | Admit: 2017-12-09 | Discharge: 2017-12-09 | Disposition: A | Discharge status: Home / Self Care | Payer: Medicare Other | Source: Ambulatory Visit | Attending: Surgery | Admitting: Surgery

## 2017-12-09 DIAGNOSIS — Z9012 Acquired absence of left breast and nipple: Secondary | ICD-10-CM | POA: Diagnosis not present

## 2017-12-09 DIAGNOSIS — I10 Essential (primary) hypertension: Secondary | ICD-10-CM | POA: Insufficient documentation

## 2017-12-09 DIAGNOSIS — Z01811 Encounter for preprocedural respiratory examination: Secondary | ICD-10-CM

## 2017-12-09 DIAGNOSIS — Z7982 Long term (current) use of aspirin: Secondary | ICD-10-CM | POA: Diagnosis not present

## 2017-12-09 DIAGNOSIS — Z452 Encounter for adjustment and management of vascular access device: Secondary | ICD-10-CM | POA: Diagnosis not present

## 2017-12-09 DIAGNOSIS — D469 Myelodysplastic syndrome, unspecified: Secondary | ICD-10-CM | POA: Diagnosis not present

## 2017-12-09 DIAGNOSIS — F17210 Nicotine dependence, cigarettes, uncomplicated: Secondary | ICD-10-CM | POA: Insufficient documentation

## 2017-12-09 DIAGNOSIS — Z880 Allergy status to penicillin: Secondary | ICD-10-CM | POA: Diagnosis not present

## 2017-12-09 DIAGNOSIS — Z79899 Other long term (current) drug therapy: Secondary | ICD-10-CM | POA: Diagnosis not present

## 2017-12-09 DIAGNOSIS — Z853 Personal history of malignant neoplasm of breast: Secondary | ICD-10-CM | POA: Diagnosis not present

## 2017-12-09 DIAGNOSIS — Z789 Other specified health status: Secondary | ICD-10-CM

## 2017-12-09 DIAGNOSIS — J449 Chronic obstructive pulmonary disease, unspecified: Secondary | ICD-10-CM | POA: Diagnosis not present

## 2017-12-09 HISTORY — PX: PORTACATH PLACEMENT: SHX2246

## 2017-12-09 SURGERY — INSERTION, TUNNELED CENTRAL VENOUS DEVICE, WITH PORT
Anesthesia: Choice | Wound class: Clean

## 2017-12-09 MED ORDER — FAMOTIDINE 20 MG PO TABS: 20.0000 mg | ORAL_TABLET | Freq: Once | ORAL | Status: DC

## 2017-12-09 MED ORDER — BUPIVACAINE HCL (PF) 0.5 % IJ SOLN
INTRAMUSCULAR | Status: AC
Start: 2017-12-09 — End: ?
  Filled 2017-12-09: qty 30

## 2017-12-09 MED ORDER — VANCOMYCIN HCL IN DEXTROSE 1-5 GM/200ML-% IV SOLN
INTRAVENOUS | Status: AC
  Filled 2017-12-09: qty 200

## 2017-12-09 MED ORDER — LIDOCAINE HCL (PF) 1 % IJ SOLN
INTRAMUSCULAR | Status: AC
  Filled 2017-12-09: qty 30

## 2017-12-09 MED ORDER — HEPARIN SOD (PORK) LOCK FLUSH 100 UNIT/ML IV SOLN
INTRAVENOUS | Status: AC
  Filled 2017-12-09: qty 5

## 2017-12-09 MED ORDER — FAMOTIDINE 20 MG PO TABS
ORAL_TABLET | ORAL | Status: AC
  Filled 2017-12-09: qty 1

## 2017-12-09 MED ORDER — CHLORHEXIDINE GLUCONATE CLOTH 2 % EX PADS: 6.0000 | MEDICATED_PAD | Freq: Once | CUTANEOUS | Status: DC

## 2017-12-09 MED ORDER — HEPARIN SOD (PORK) LOCK FLUSH 100 UNIT/ML IV SOLN
INTRAVENOUS | Status: DC | PRN
  Administered 2017-12-09: 500 [IU] via INTRAVENOUS

## 2017-12-09 MED ORDER — LIDOCAINE HCL 1 % IJ SOLN
INTRAMUSCULAR | Status: DC | PRN
  Administered 2017-12-09: 14 mL

## 2017-12-09 MED ORDER — CHLORHEXIDINE GLUCONATE CLOTH 2 % EX PADS
6.0000 | MEDICATED_PAD | Freq: Once | CUTANEOUS | Status: AC
  Administered 2017-12-09: 6 via TOPICAL

## 2017-12-09 MED ORDER — LACTATED RINGERS IV SOLN
INTRAVENOUS | Status: DC
  Administered 2017-12-09: 13:00:00 via INTRAVENOUS

## 2017-12-09 SURGICAL SUPPLY — 27 items
BAG DECANTER FOR FLEXI CONT (MISCELLANEOUS) ×3 IMPLANT
BLADE SURG SZ11 CARB STEEL (BLADE) ×3 IMPLANT
CHLORAPREP W/TINT 26ML (MISCELLANEOUS) ×3 IMPLANT
COVER LIGHT HANDLE STERIS (MISCELLANEOUS) IMPLANT
COVER PROBE FLX POLY STRL (MISCELLANEOUS) ×3 IMPLANT
DECANTER SPIKE VIAL GLASS SM (MISCELLANEOUS) ×6 IMPLANT
DERMABOND ADVANCED (GAUZE/BANDAGES/DRESSINGS) ×2
DERMABOND ADVANCED .7 DNX12 (GAUZE/BANDAGES/DRESSINGS) ×1 IMPLANT
DRAPE C-ARM 42X70 (DRAPES) ×3 IMPLANT
DRAPE LAPAROTOMY 77X122 PED (DRAPES) ×3 IMPLANT
ELECT REM PT RETURN 9FT ADLT (ELECTROSURGICAL) ×3
ELECTRODE REM PT RTRN 9FT ADLT (ELECTROSURGICAL) ×1 IMPLANT
GLOVE BIO SURGEON STRL SZ7 (GLOVE) ×9 IMPLANT
GLOVE BIOGEL PI IND STRL 7.5 (GLOVE) ×3 IMPLANT
GLOVE BIOGEL PI INDICATOR 7.5 (GLOVE) ×6
GOWN STRL REUS W/TWL LRG LVL3 (GOWN DISPOSABLE) ×9 IMPLANT
IV NS 500ML (IV SOLUTION) ×2
IV NS 500ML BAXH (IV SOLUTION) ×1 IMPLANT
KIT PORT POWER 8FR ISP CVUE (Miscellaneous) ×3 IMPLANT
KIT RM TURNOVER STRD PROC AR (KITS) ×3 IMPLANT
NEEDLE HYPO 25X1 1.5 SAFETY (NEEDLE) ×3 IMPLANT
PACK BASIN MINOR ARMC (MISCELLANEOUS) ×3 IMPLANT
SUT MNCRL AB 4-0 PS2 18 (SUTURE) ×3 IMPLANT
SUT VIC AB 3-0 SH 27 (SUTURE) ×2
SUT VIC AB 3-0 SH 27X BRD (SUTURE) ×1 IMPLANT
SYR 10ML LL (SYRINGE) ×3 IMPLANT
SYR 20CC LL (SYRINGE) ×3 IMPLANT

## 2017-12-09 NOTE — Discharge Instructions (Signed)
In addition to included general post-operative instructions for Placement of Tunneled Central Venous Catheter with Subcutaneous Port,  Diet: Resume home heart healthy diet.   Activity: No heavy lifting >20 pounds (children, pets, laundry, garbage) or strenuous activity until follow-up, but light activity and walking are encouraged. Do not drive or drink alcohol if taking narcotic pain medications.  Wound care: 2 days after surgery (Thursday, 1/24), may shower/get incision wet with soapy water and pat dry (do not rub incisions), but no baths or submerging incision underwater until follow-up. Do NOT pick at the incisions or the skin glue.  Medications: Resume all home medications. For mild to moderate pain: acetaminophen (Tylenol) or ibuprofen/naproxen (if no kidney disease). Combining Tylenol with alcohol can substantially increase your risk of causing liver disease.  Call office [phone]) at any time if any questions, worsening pain, fevers/chills, bleeding, drainage from incision site, or other concerns.

## 2017-12-09 NOTE — Op Note (Signed)
SURGICAL PROCEDURE REPORT  DATE OF PROCEDURE: 12/09/2017   ATTENDING SURGEON: Corene Cornea E. Rosana Hoes, MD   ANESTHESIA: Local with light IV sedation   PRE-OPERATIVE DIAGNOSIS: Myelodysplastic syndrome requiring durable central venous access for blood transfusions (ICD-10's: 46.9)  POST-OPERATIVE DIAGNOSIS: Myelodysplastic syndrome requiring durable central venous access for blood transfusions (ICD-10's: 46.9)  PROCEDURE(S): (cpt: 36561) 1.) Percutaneous access of Right internal jugular vein under ultrasound guidance  2.) Insertion of tunneled Right internal jugular Bard PowerPort central venous catheter with subcutaneous port  INTRAOPERATIVE FINDINGS: Patent easily compressible Right internal jugular vein with appropriate respiratory variations and well-secured tunneled central venous catheter with subcutaneous port at completion of the procedure  INTRAOPERATIVE FLUIDS: 200 mL crystalloid, 0 mL contrast used   FLUOROSCOPY: 1 second  ESTIMATED BLOOD LOSS: Minimal (<20 mL)   SPECIMENS: None   IMPLANTS: 19F tunneled Bard PowerPort central venous catheter with subcutaneous port  DRAINS: None   COMPLICATIONS: None apparent   CONDITION AT COMPLETION: Hemodynamically stable, awake   DISPOSITION: PACU   INDICATION(S) FOR PROCEDURE:  Patient is a 84 y.o. female who presented with myelodysplastic syndrome requiring durable central venous access for frequent blood transfusions. All risks, benefits, and alternatives to above elective procedures were discussed with the patient and her daughter, who together elected to proceed, and informed consent was accordingly obtained at that time.  DETAILS OF PROCEDURE:  Patient was brought to the operative suite and appropriately identified. In Trendelenburg position, Right IJ venous access site was prepped and draped in the usual sterile fashion, and following a brief timeout, limited duplex evaluation of the Right internal jugular vein was performed.  Percutaneous Right IJ venous access was obtained under ultrasound guidance using Seldinger technique, by which local anesthetic was injected over the Right IJ vein, and access needle was inserted under direct ultrasound visualization into the Right IJ vein, through which soft guidewire was advanced, over which access needle was withdrawn. Guidewire was secured, attention was directed to injection of local anesthetic along the planned tunnel site, 2-3 cm transverse Right chest incision was made and confirmed to accommodate the subcutaneous port, and flushed catheter was tunneled retrograde from the port site over the Right chest to the Right IJ access site with the attached port well-secured to the catheter and within the subcutaneous pocket. Insertion sheath was advanced over the guidewire, which was withdrawn along with the insertion sheath dilator. Length of catheter needed to position the catheter tip at the atrio-caval junction was then measured under direct fluoroscopic visualization, after which the catheter was cut to the measured length and advanced through the sheath into the Right internal jugular vein and SVC without evidence of cardiac arrhythmias during the procedure. Port was confirmed to withdraw blood and flush easily, after which concentrated heparin was instilled into the port and catheter. Dermis at the subcutaneous pocket was re-approximated using buried interrupted 3-0 Vicryl suture, and 4-0 Vicryl suture was used to re-approximate skin at the insertion and subcutaneous port sites in running subcuticular fashion for the subcutaneous port and buried interrupted fashion for the insertion site. Skin was cleaned, dried, and sterile skin glue was applied. Patient was then safely transferred to PACU for a chest x-ray.  I was present for all aspects of the procedures, and there were no intraprocedural complications apparent.

## 2017-12-09 NOTE — Interval H&P Note (Signed)
History and Physical Interval Note:  12/09/2017 12:58 PM  Chelsea Horne  has presented today for surgery, with the diagnosis of MDS  The various methods of treatment have been discussed with the patient and family. After consideration of risks, benefits and other options for treatment, the patient has consented to  Procedure(s): INSERTION PORT-A-CATH (N/A) as a surgical intervention .  The patient's history has been reviewed, patient examined, no change in status, stable for surgery.  I have reviewed the patient's chart and labs.  Questions were answered to the patient's satisfaction.     Vickie Epley

## 2017-12-09 NOTE — OR Nursing (Signed)
Discussed discharge in formation with daughter. Voices understanding. Chest xray done.Marland Kitchen

## 2017-12-10 ENCOUNTER — Encounter: Payer: Self-pay | Admitting: Surgery

## 2017-12-10 ENCOUNTER — Encounter: Payer: Self-pay | Admitting: Registered Nurse

## 2017-12-10 DIAGNOSIS — K449 Diaphragmatic hernia without obstruction or gangrene: Secondary | ICD-10-CM | POA: Diagnosis not present

## 2017-12-10 DIAGNOSIS — I10 Essential (primary) hypertension: Secondary | ICD-10-CM | POA: Diagnosis not present

## 2017-12-10 DIAGNOSIS — D469 Myelodysplastic syndrome, unspecified: Secondary | ICD-10-CM | POA: Diagnosis not present

## 2017-12-10 DIAGNOSIS — Z853 Personal history of malignant neoplasm of breast: Secondary | ICD-10-CM | POA: Diagnosis not present

## 2017-12-10 DIAGNOSIS — J449 Chronic obstructive pulmonary disease, unspecified: Secondary | ICD-10-CM | POA: Diagnosis not present

## 2017-12-10 DIAGNOSIS — F17210 Nicotine dependence, cigarettes, uncomplicated: Secondary | ICD-10-CM | POA: Diagnosis not present

## 2017-12-11 DIAGNOSIS — I10 Essential (primary) hypertension: Secondary | ICD-10-CM | POA: Diagnosis not present

## 2017-12-11 DIAGNOSIS — D469 Myelodysplastic syndrome, unspecified: Secondary | ICD-10-CM | POA: Diagnosis not present

## 2017-12-11 DIAGNOSIS — J449 Chronic obstructive pulmonary disease, unspecified: Secondary | ICD-10-CM | POA: Diagnosis not present

## 2017-12-11 DIAGNOSIS — K449 Diaphragmatic hernia without obstruction or gangrene: Secondary | ICD-10-CM | POA: Diagnosis not present

## 2017-12-11 DIAGNOSIS — Z853 Personal history of malignant neoplasm of breast: Secondary | ICD-10-CM | POA: Diagnosis not present

## 2017-12-11 DIAGNOSIS — F17210 Nicotine dependence, cigarettes, uncomplicated: Secondary | ICD-10-CM | POA: Diagnosis not present

## 2017-12-12 ENCOUNTER — Inpatient Hospital Stay: Payer: Medicare Other

## 2017-12-12 ENCOUNTER — Other Ambulatory Visit: Payer: Self-pay | Admitting: *Deleted

## 2017-12-12 VITALS — BP 112/67 | HR 71 | Temp 97.3°F | Resp 18

## 2017-12-12 DIAGNOSIS — Z79899 Other long term (current) drug therapy: Secondary | ICD-10-CM | POA: Diagnosis not present

## 2017-12-12 DIAGNOSIS — D638 Anemia in other chronic diseases classified elsewhere: Secondary | ICD-10-CM

## 2017-12-12 DIAGNOSIS — D696 Thrombocytopenia, unspecified: Secondary | ICD-10-CM | POA: Diagnosis not present

## 2017-12-12 DIAGNOSIS — D469 Myelodysplastic syndrome, unspecified: Secondary | ICD-10-CM | POA: Diagnosis not present

## 2017-12-12 DIAGNOSIS — D464 Refractory anemia, unspecified: Secondary | ICD-10-CM

## 2017-12-12 DIAGNOSIS — D72819 Decreased white blood cell count, unspecified: Secondary | ICD-10-CM | POA: Diagnosis not present

## 2017-12-12 LAB — CBC WITH DIFFERENTIAL/PLATELET
BASOS ABS: 0 10*3/uL (ref 0–0.1)
BASOS PCT: 1 %
EOS ABS: 0 10*3/uL (ref 0–0.7)
Eosinophils Relative: 0 %
HEMATOCRIT: 23.9 % — AB (ref 35.0–47.0)
HEMOGLOBIN: 8.1 g/dL — AB (ref 12.0–16.0)
Lymphocytes Relative: 61 %
Lymphs Abs: 1.2 10*3/uL (ref 1.0–3.6)
MCH: 31.4 pg (ref 26.0–34.0)
MCHC: 33.8 g/dL (ref 32.0–36.0)
MCV: 92.9 fL (ref 80.0–100.0)
Monocytes Absolute: 0.1 10*3/uL — ABNORMAL LOW (ref 0.2–0.9)
Monocytes Relative: 6 %
NEUTROS PCT: 32 %
Neutro Abs: 0.6 10*3/uL — ABNORMAL LOW (ref 1.4–6.5)
Platelets: 217 10*3/uL (ref 150–440)
RBC: 2.57 MIL/uL — AB (ref 3.80–5.20)
RDW: 17.6 % — ABNORMAL HIGH (ref 11.5–14.5)
WBC: 1.9 10*3/uL — AB (ref 3.6–11.0)

## 2017-12-12 LAB — SAMPLE TO BLOOD BANK

## 2017-12-12 MED ORDER — EPOETIN ALFA 40000 UNIT/ML IJ SOLN
40000.0000 [IU] | Freq: Once | INTRAMUSCULAR | Status: AC
  Administered 2017-12-12: 40000 [IU] via SUBCUTANEOUS
  Filled 2017-12-12: qty 1

## 2017-12-12 MED ORDER — LIDOCAINE-PRILOCAINE 2.5-2.5 % EX CREA: 1.0000 "application " | TOPICAL_CREAM | CUTANEOUS | 2 refills | Status: DC | PRN

## 2017-12-12 MED ORDER — SODIUM CHLORIDE 0.9% FLUSH
10.0000 mL | INTRAVENOUS | Status: DC | PRN
  Administered 2017-12-12: 10 mL via INTRAVENOUS
  Filled 2017-12-12: qty 10

## 2017-12-12 MED ORDER — HEPARIN SOD (PORK) LOCK FLUSH 100 UNIT/ML IV SOLN
500.0000 [IU] | Freq: Once | INTRAVENOUS | Status: AC
  Administered 2017-12-12: 500 [IU] via INTRAVENOUS
  Filled 2017-12-12: qty 5

## 2017-12-12 NOTE — Progress Notes (Signed)
Hgb: 8.1. MD, Dr. Grayland Ormond, notified via telephone. Per MD order: Proceed with giving Procrit injection 40,000 units subcutaneous today. Order present in supportive therapy plan.

## 2017-12-14 NOTE — Progress Notes (Addendum)
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  INTERVAL HISTORY: Patient returns to clinic today for further evaluation and consideration of additional blood or Procrit. She continues to have increased weakness and fatigue, but admits this is slightly improved. She has had no further falls.  She has a poor appetite, but her weight has remained stable.  She has no neurologic complaints. She denies any recent fevers, chills, night sweats, or weight loss.  She denies any chest pain or shortness of breath. She has no nausea, vomiting, constipation, or diarrhea. She has no melena or hematochezia. She has no urinary complaints.  Patient offers no further specific complaints today.  REVIEW OF SYSTEMS:   Review of Systems  Constitutional: Positive for malaise/fatigue. Negative for fever and weight loss.  Eyes: Negative for blurred vision, double vision and pain.  Respiratory: Negative.  Negative for cough and shortness of breath.   Cardiovascular: Negative.  Negative for chest pain and leg swelling.  Gastrointestinal: Negative.  Negative for abdominal pain, blood in stool, constipation, diarrhea, melena, nausea and vomiting.  Genitourinary: Negative.   Musculoskeletal: Negative.  Negative for falls and joint pain.  Skin: Negative.  Negative for rash.  Neurological: Positive for weakness. Negative for dizziness and sensory change.  Psychiatric/Behavioral: Positive for memory loss. The patient is not nervous/anxious and does not have insomnia.     As per HPI. Otherwise, a complete review of systems is negative.  PAST MEDICAL HISTORY: Past Medical History:  Diagnosis Date  . Breast cancer ([place])    mastectomy  . COPD (chronic obstructive pulmonary disease) ([place])   . Heart murmur   . Hiatal hernia   . Hypertension   . UTI (lower urinary tract infection)   . UTI (urinary tract infection)    frequent in past    PAST SURGICAL HISTORY: Past Surgical History:  Procedure Laterality Date  . ABDOMINAL HYSTERECTOMY    . BONE MARROW ASPIRATION  2018  . FOOT SURGERY     for hammertoe  . MASTECTOMY Left    modified radical in August 1996  . PORTACATH PLACEMENT N/A 12/09/2017   Procedure: INSERTION PORT-A-CATH;  Surgeon: Vickie Epley, MD;  Location: [facility] ORS;  Service: Vascular;  Laterality: N/A;    FAMILY HISTORY Family History  Problem Relation Age of Onset  . Cancer Brother        lung  . Hypertension Brother   . Anemia Brother   . Myelodysplastic syndrome Brother        ADVANCED DIRECTIVES:    HEALTH MAINTENANCE: Social History   Tobacco Use  . Smoking status: Current Some Day Smoker    Packs/day: 0.25    Types: Cigarettes  . Smokeless tobacco: Never Used  Substance Use Topics  . Alcohol use: No  . Drug use: No     Colonoscopy:  PAP:  Bone density:  Lipid panel:  Allergies  Allergen Reactions  . Nitrofurantoin Other (See Comments)  . Penicillins Itching  . Sulfa Antibiotics Itching    Current Outpatient Medications  Medication Sig Dispense Refill  . acetaminophen (RA ACETAMINOPHEN) 650 MG CR tablet Take 650 mg by mouth every 8 (eight) hours as needed.     Marland Kitchen alendronate (FOSAMAX) 70 MG tablet Take  70 mg by mouth once a week. Saturaday  0  . Ascorbic Acid (VITAMIN C) 1000 MG tablet Take 1,000 mg by mouth daily.    Marland Kitchen aspirin 81 MG tablet Take 81 mg by mouth daily.    . calcium-vitamin D (OSCAL WITH D) 250-125 MG-UNIT per tablet Take 1 tablet by mouth daily.    Marland Kitchen gabapentin (NEURONTIN) 100 MG capsule Take 1 capsule by mouth at bedtime.    . lidocaine-prilocaine (EMLA) cream Apply 1 application topically as needed. Apply to port 1 hour prior to  appointment. Cover with plastic wrap. 30 g 2  . Multiple Minerals (JOINT HEALTH MINERAL PO) Take 1 tablet by mouth daily.    . Multiple Vitamin (MULTIVITAMIN) tablet Take 1 tablet by mouth daily.    . psyllium (REGULOID) 0.52 g capsule Take 0.52 g by mouth daily. Metamucil    . valsartan (DIOVAN) 40 MG tablet Take 40 mg by mouth daily.     No current facility-administered medications for this visit.    Facility-Administered Medications Ordered in Other Visits  Medication Dose Route Frequency Provider Last Rate Last Dose  . heparin lock flush 100 unit/mL  500 Units Intravenous Once [name], MD      . sodium chloride flush (NS) 0.9 % injection 10 mL  10 mL Intravenous PRN [name], MD   10 mL at 12/18/17 0843    OBJECTIVE: Vitals:   12/18/17 0910  BP: 124/61  Pulse: 64  Resp: 18  Temp: (!) 96.5 F (35.8 C)     Body mass index is 17.35 kg/m.    ECOG FS:2 - Symptomatic, <50% confined to bed  General: Well-developed, well-nourished, no acute distress. Eyes: Pink conjunctiva, anicteric sclera. Lungs: Clear to auscultation bilaterally. Heart: Regular rate and rhythm. No rubs, murmurs, or gallops. Abdomen: Soft, nontender, nondistended. No organomegaly noted, normoactive bowel sounds. Musculoskeletal: No edema, cyanosis, or clubbing. Neuro: Alert, answering all questions appropriately. Cranial nerves grossly intact. Skin: No rashes or petechiae noted. Psych: Normal affect.  LAB RESULTS:  Lab Results  Component Value Date   NA 129 (L) 06/10/2014   K 4.3 06/10/2014   CL 96 (L) 06/10/2014   CO2 23 06/10/2014   GLUCOSE 140 (H) 06/10/2014   BUN 22 (H) 06/10/2014   CREATININE 1.24 06/10/2014   CALCIUM 10.3 (H) 06/10/2014   PROT 7.1 06/10/2014   ALBUMIN 2.3 (L) 06/10/2014   AST 11 (L) 06/10/2014   ALT 13 (L) 06/10/2014   ALKPHOS 70 06/10/2014   BILITOT 0.4 06/10/2014   GFRNONAA 40 (L) 06/10/2014   GFRAA 46 (L) 06/10/2014    Lab Results  Component  Value Date   WBC 1.7 (L) 12/18/2017   NEUTROABS PENDING 12/18/2017   HGB 8.1 (L) 12/18/2017   HCT 23.5 (L) 12/18/2017   MCV 93.0 12/18/2017   PLT 166 12/18/2017   Lab Results  Component Value Date   FERRITIN 595 (H) 10/01/2017   Lab Results  Component Value Date   IRON 214 (H) 10/01/2017   TIBC 256 10/01/2017   IRONPCTSAT 83 (H) 10/01/2017      STUDIES: Dg Chest Port 1 View  Result Date: 12/09/2017 CLINICAL DATA:  Right IJ central line catheter placement. EXAM: PORTABLE CHEST 1 VIEW COMPARISON:  CXR 06/20/2014 FINDINGS: Hyperinflated lungs without pneumothorax. Right port catheter tip from an IJ approach is noted with tip in the distal SVC. Heart is normal in size. There is moderate aortic atherosclerosis without aneurysm. High-riding humeral heads right worse  than left compatible with chronic rotator cuff tears. Degenerative osteoarthritic joint space narrowing and spurring is seen about both shoulders. IMPRESSION: 1. New right IJ port catheter is noted without pneumothorax. Tip is seen in the distal SVC. 2. Hyperinflated lungs. 3. Aortic atherosclerosis. Electronically Signed   By: Ashley Royalty M.D.   On: 12/09/2017 15:18   Dg C-arm 1-60 Min-no Report  Result Date: 12/09/2017 Fluoroscopy was utilized by the requesting physician.  No radiographic interpretation.    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  PLAN:    1. 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  Being the sole abnormality, is usually associated with a better outcome.  Patient also noted increased blast count, but unclear how much.  She also was noted to have an increase in clonal plasma cells possibly indicating an additional plasma cell dyscrasia.  Continue supportive care as needed.  Patient does not require transfusion today, but will proceed with 40,000 units  Procrit.  Return to clinic in 1 week with repeat laboratory work and consideration of transfusion or Procrit.  Patient will then return to clinic in 2 weeks for further evaluation.   2.  History of breast cancer: No evidence of disease.  Patient's mammograms are ordered by her PCP. 3.  Hypertension: Blood pressure is within normal limits today. Continue treatment per PCP. 4.  Leukopenia: Secondary to underlying MDS. Bone marrow biopsy as above. 5.  Thrombocytopenia: Patient's platelet count is now within normal limits.  Monitor.   6.  Elevated iron stores: Patient will require Desferal with transfusions.  Approximately 30 minutes was spent in discussion of which greater than 50% was consultation.  Patient expressed understanding and was in agreement with this plan. She also understands that She can call clinic at any time with any questions, concerns, or complaints.    [name], MD 12/18/17 9:29 AM    Addendum: Patient has progressive MDS that causes pancytopenia, specifically severe symptomatic anemia.  Because this patient has persistent weakness and fatigue making it difficult to get in and out of bed.  A hospital bed would offer assistance and potentially minimize injury when transferring.   [name], MD 01/06/18 2:09 PM

## 2017-12-15 DIAGNOSIS — I10 Essential (primary) hypertension: Secondary | ICD-10-CM | POA: Diagnosis not present

## 2017-12-15 DIAGNOSIS — J449 Chronic obstructive pulmonary disease, unspecified: Secondary | ICD-10-CM | POA: Diagnosis not present

## 2017-12-15 DIAGNOSIS — Z853 Personal history of malignant neoplasm of breast: Secondary | ICD-10-CM | POA: Diagnosis not present

## 2017-12-15 DIAGNOSIS — K449 Diaphragmatic hernia without obstruction or gangrene: Secondary | ICD-10-CM | POA: Diagnosis not present

## 2017-12-15 DIAGNOSIS — F17210 Nicotine dependence, cigarettes, uncomplicated: Secondary | ICD-10-CM | POA: Diagnosis not present

## 2017-12-15 DIAGNOSIS — D469 Myelodysplastic syndrome, unspecified: Secondary | ICD-10-CM | POA: Diagnosis not present

## 2017-12-17 DIAGNOSIS — K449 Diaphragmatic hernia without obstruction or gangrene: Secondary | ICD-10-CM | POA: Diagnosis not present

## 2017-12-17 DIAGNOSIS — J449 Chronic obstructive pulmonary disease, unspecified: Secondary | ICD-10-CM | POA: Diagnosis not present

## 2017-12-17 DIAGNOSIS — D469 Myelodysplastic syndrome, unspecified: Secondary | ICD-10-CM | POA: Diagnosis not present

## 2017-12-17 DIAGNOSIS — I10 Essential (primary) hypertension: Secondary | ICD-10-CM | POA: Diagnosis not present

## 2017-12-17 DIAGNOSIS — Z853 Personal history of malignant neoplasm of breast: Secondary | ICD-10-CM | POA: Diagnosis not present

## 2017-12-17 DIAGNOSIS — F17210 Nicotine dependence, cigarettes, uncomplicated: Secondary | ICD-10-CM | POA: Diagnosis not present

## 2017-12-18 ENCOUNTER — Inpatient Hospital Stay: Payer: Medicare Other

## 2017-12-18 ENCOUNTER — Inpatient Hospital Stay (HOSPITAL_BASED_OUTPATIENT_CLINIC_OR_DEPARTMENT_OTHER): Payer: Medicare Other | Admitting: Oncology

## 2017-12-18 VITALS — BP 124/61 | HR 64 | Temp 96.5°F | Resp 18 | Wt 114.1 lb

## 2017-12-18 DIAGNOSIS — D469 Myelodysplastic syndrome, unspecified: Secondary | ICD-10-CM | POA: Diagnosis not present

## 2017-12-18 DIAGNOSIS — I700 Atherosclerosis of aorta: Secondary | ICD-10-CM

## 2017-12-18 DIAGNOSIS — Z79899 Other long term (current) drug therapy: Secondary | ICD-10-CM | POA: Diagnosis not present

## 2017-12-18 DIAGNOSIS — D72819 Decreased white blood cell count, unspecified: Secondary | ICD-10-CM

## 2017-12-18 DIAGNOSIS — D696 Thrombocytopenia, unspecified: Secondary | ICD-10-CM | POA: Diagnosis not present

## 2017-12-18 DIAGNOSIS — Z7982 Long term (current) use of aspirin: Secondary | ICD-10-CM

## 2017-12-18 DIAGNOSIS — J449 Chronic obstructive pulmonary disease, unspecified: Secondary | ICD-10-CM | POA: Diagnosis not present

## 2017-12-18 DIAGNOSIS — R531 Weakness: Secondary | ICD-10-CM | POA: Diagnosis not present

## 2017-12-18 DIAGNOSIS — R011 Cardiac murmur, unspecified: Secondary | ICD-10-CM | POA: Diagnosis not present

## 2017-12-18 DIAGNOSIS — D464 Refractory anemia, unspecified: Secondary | ICD-10-CM

## 2017-12-18 DIAGNOSIS — Z801 Family history of malignant neoplasm of trachea, bronchus and lung: Secondary | ICD-10-CM

## 2017-12-18 DIAGNOSIS — R630 Anorexia: Secondary | ICD-10-CM

## 2017-12-18 DIAGNOSIS — Z9013 Acquired absence of bilateral breasts and nipples: Secondary | ICD-10-CM

## 2017-12-18 DIAGNOSIS — F17210 Nicotine dependence, cigarettes, uncomplicated: Secondary | ICD-10-CM

## 2017-12-18 DIAGNOSIS — D638 Anemia in other chronic diseases classified elsewhere: Secondary | ICD-10-CM

## 2017-12-18 DIAGNOSIS — Z87440 Personal history of urinary (tract) infections: Secondary | ICD-10-CM

## 2017-12-18 DIAGNOSIS — R5383 Other fatigue: Secondary | ICD-10-CM

## 2017-12-18 DIAGNOSIS — K449 Diaphragmatic hernia without obstruction or gangrene: Secondary | ICD-10-CM

## 2017-12-18 DIAGNOSIS — Z853 Personal history of malignant neoplasm of breast: Secondary | ICD-10-CM

## 2017-12-18 DIAGNOSIS — I10 Essential (primary) hypertension: Secondary | ICD-10-CM

## 2017-12-18 LAB — CBC WITH DIFFERENTIAL/PLATELET
BASOS ABS: 0 10*3/uL (ref 0–0.1)
Basophils Relative: 1 %
EOS ABS: 0 10*3/uL (ref 0–0.7)
Eosinophils Relative: 0 %
HCT: 23.5 % — ABNORMAL LOW (ref 35.0–47.0)
Hemoglobin: 8.1 g/dL — ABNORMAL LOW (ref 12.0–16.0)
LYMPHS PCT: 60 %
Lymphs Abs: 1.1 10*3/uL (ref 1.0–3.6)
MCH: 32 pg (ref 26.0–34.0)
MCHC: 34.4 g/dL (ref 32.0–36.0)
MCV: 93 fL (ref 80.0–100.0)
Monocytes Absolute: 0.1 10*3/uL — ABNORMAL LOW (ref 0.2–0.9)
Monocytes Relative: 8 %
NEUTROS ABS: 0.5 10*3/uL — AB (ref 1.4–6.5)
Neutrophils Relative %: 31 %
Platelets: 166 10*3/uL (ref 150–440)
RBC: 2.53 MIL/uL — ABNORMAL LOW (ref 3.80–5.20)
RDW: 17.8 % — ABNORMAL HIGH (ref 11.5–14.5)
WBC: 1.7 10*3/uL — ABNORMAL LOW (ref 3.6–11.0)

## 2017-12-18 LAB — SAMPLE TO BLOOD BANK

## 2017-12-18 MED ORDER — SODIUM CHLORIDE 0.9% FLUSH
10.0000 mL | INTRAVENOUS | Status: DC | PRN
  Administered 2017-12-18: 10 mL via INTRAVENOUS
  Filled 2017-12-18: qty 10

## 2017-12-18 MED ORDER — EPOETIN ALFA 40000 UNIT/ML IJ SOLN
40000.0000 [IU] | Freq: Once | INTRAMUSCULAR | Status: AC
  Administered 2017-12-18: 40000 [IU] via SUBCUTANEOUS
  Filled 2017-12-18: qty 1

## 2017-12-18 MED ORDER — HEPARIN SOD (PORK) LOCK FLUSH 100 UNIT/ML IV SOLN
500.0000 [IU] | Freq: Once | INTRAVENOUS | Status: AC
  Administered 2017-12-18: 500 [IU] via INTRAVENOUS
  Filled 2017-12-18: qty 5

## 2017-12-19 DIAGNOSIS — D469 Myelodysplastic syndrome, unspecified: Secondary | ICD-10-CM | POA: Diagnosis not present

## 2017-12-19 DIAGNOSIS — I10 Essential (primary) hypertension: Secondary | ICD-10-CM | POA: Diagnosis not present

## 2017-12-19 DIAGNOSIS — K449 Diaphragmatic hernia without obstruction or gangrene: Secondary | ICD-10-CM | POA: Diagnosis not present

## 2017-12-19 DIAGNOSIS — J449 Chronic obstructive pulmonary disease, unspecified: Secondary | ICD-10-CM | POA: Diagnosis not present

## 2017-12-19 DIAGNOSIS — Z853 Personal history of malignant neoplasm of breast: Secondary | ICD-10-CM | POA: Diagnosis not present

## 2017-12-19 DIAGNOSIS — F17210 Nicotine dependence, cigarettes, uncomplicated: Secondary | ICD-10-CM | POA: Diagnosis not present

## 2017-12-22 ENCOUNTER — Ambulatory Visit (INDEPENDENT_AMBULATORY_CARE_PROVIDER_SITE_OTHER): Payer: Medicare Other | Admitting: Surgery

## 2017-12-22 ENCOUNTER — Encounter: Payer: Self-pay | Admitting: Surgery

## 2017-12-22 VITALS — BP 109/63 | HR 65 | Temp 98.1°F | Ht >= 80 in | Wt 114.0 lb

## 2017-12-22 DIAGNOSIS — J449 Chronic obstructive pulmonary disease, unspecified: Secondary | ICD-10-CM | POA: Diagnosis not present

## 2017-12-22 DIAGNOSIS — Z08 Encounter for follow-up examination after completed treatment for malignant neoplasm: Secondary | ICD-10-CM | POA: Diagnosis not present

## 2017-12-22 DIAGNOSIS — Z85828 Personal history of other malignant neoplasm of skin: Secondary | ICD-10-CM | POA: Diagnosis not present

## 2017-12-22 DIAGNOSIS — Z4889 Encounter for other specified surgical aftercare: Secondary | ICD-10-CM

## 2017-12-22 DIAGNOSIS — Z853 Personal history of malignant neoplasm of breast: Secondary | ICD-10-CM | POA: Diagnosis not present

## 2017-12-22 DIAGNOSIS — D0472 Carcinoma in situ of skin of left lower limb, including hip: Secondary | ICD-10-CM | POA: Diagnosis not present

## 2017-12-22 DIAGNOSIS — D469 Myelodysplastic syndrome, unspecified: Secondary | ICD-10-CM | POA: Diagnosis not present

## 2017-12-22 DIAGNOSIS — L570 Actinic keratosis: Secondary | ICD-10-CM | POA: Diagnosis not present

## 2017-12-22 DIAGNOSIS — L820 Inflamed seborrheic keratosis: Secondary | ICD-10-CM | POA: Diagnosis not present

## 2017-12-22 DIAGNOSIS — I10 Essential (primary) hypertension: Secondary | ICD-10-CM | POA: Diagnosis not present

## 2017-12-22 DIAGNOSIS — D485 Neoplasm of uncertain behavior of skin: Secondary | ICD-10-CM | POA: Diagnosis not present

## 2017-12-22 DIAGNOSIS — X32XXXA Exposure to sunlight, initial encounter: Secondary | ICD-10-CM | POA: Diagnosis not present

## 2017-12-22 DIAGNOSIS — K449 Diaphragmatic hernia without obstruction or gangrene: Secondary | ICD-10-CM | POA: Diagnosis not present

## 2017-12-22 DIAGNOSIS — F17210 Nicotine dependence, cigarettes, uncomplicated: Secondary | ICD-10-CM | POA: Diagnosis not present

## 2017-12-22 NOTE — Progress Notes (Signed)
Surgical Clinic Progress/Follow-up Note   HPI:  84 y.o. Female presents to clinic for post-op follow-up evaluation s/p insertion of Right IJ central venous catheter with subcutaneous port for frequent blood draws and PRBC transfusions required for myelodysplasia syndrome and poor IV access. Patient reports she forgets even having had the port placed and denies any associated pain, fever/chills, CP, or SOB. Patient's daughter adds that patient did attempt to pick/scratch at the wounds, forgetting she had surgery, and was advised by her daughter to stop scratching her incisions. Port has reportedly been used twice for blood draws, no transfusions, and has been flushed.  Review of Systems:  Constitutional: denies any other weight loss, fever, chills, or sweats  Eyes: denies any other vision changes, history of eye injury  ENT: denies sore throat, hearing problems  Respiratory: denies shortness of breath, wheezing  Cardiovascular: denies chest pain, palpitations  Gastrointestinal: denies abdominal pain, N/V, or diarrhea Musculoskeletal: denies any other joint pains or cramps  Skin: Denies any other rashes or skin discolorations except post-surgical wound as per HPI Neurological: denies any other headache, dizziness, weakness  Psychiatric: denies any other depression, anxiety  All other review of systems: otherwise negative   Vital Signs:  BP 109/63   Pulse 65   Temp 98.1 F (36.7 C) (Oral)   Ht 7\' 5"  (2.261 m)   Wt 114 lb (51.7 kg)   BMI 10.12 kg/m    Physical Exam:  Constitutional:  -- Normal body habitus  -- Awake, alert, and oriented x3  Eyes:  -- Pupils equally round and reactive to light  -- No scleral icterus  Ear, nose, throat:  -- No jugular venous distension  -- No nasal drainage, bleeding Pulmonary:  -- No crackles -- Equal breath sounds bilaterally -- Breathing non-labored at rest Cardiovascular:  -- S1, S2 present  -- No pericardial rubs  Gastrointestinal:   -- Soft, nontender, non-distended, no guarding/rebound  -- No abdominal masses appreciated, pulsatile or otherwise  Musculoskeletal / Integumentary:  -- Wounds or skin discoloration: post-surgical Right neck and Right upper chest wounds well-approximated and completely non-tender without any surrounding erythema or drainage, not much overlying fat/tissue  -- Extremities: B/L UE and LE FROM, hands and feet warm, no edema  Neurologic:  -- Motor function: intact and symmetric  -- Sensation: intact and symmetric   Laboratory studies:  CBC Latest Ref Rng & Units 12/18/2017 12/12/2017 12/04/2017  WBC 3.6 - 11.0 K/uL 1.7(L) 1.9(L) 1.7(L)  Hemoglobin 12.0 - 16.0 g/dL 8.1(L) 8.1(L) 8.8(L)  Hematocrit 35.0 - 47.0 % 23.5(L) 23.9(L) 26.7(L)  Platelets 150 - 440 K/uL 166 217 163    Assessment:  84 y.o. yo Female with a problem list including...  Patient Active Problem List   Diagnosis Date Noted  . Complete tear of right rotator cuff 05/03/2016  . Rotator cuff arthropathy, right 05/03/2016  . MDS (myelodysplastic syndrome) ([place]) 03/13/2015    presents to clinic for post-op follow-up evaluation, doing well s/p insertion of Right IJ central venous catheter with indwelling subcutaneous Right upper chest wall port.  Plan:   - avoid picking at or scratching incision sites  - keep incision sites clean and dry, particularly while healing   - okay to continue using port as needed for blood draws and/or transfusion/medications  - return to clinic as needed, instructed to call office if any questions or concerns  - continue flushes per infusion center to maintain port/catheter patency  All of the above recommendations were discussed with the patient  and her daughter, and all of patient's and family's questions were answered to their expressed satisfaction.  -- Marilynne Drivers Rosana Hoes, MD, [place]: [facility] Partnering for exceptional care. Office: [phone]

## 2017-12-22 NOTE — Patient Instructions (Signed)

## 2017-12-23 DIAGNOSIS — K449 Diaphragmatic hernia without obstruction or gangrene: Secondary | ICD-10-CM | POA: Diagnosis not present

## 2017-12-23 DIAGNOSIS — F17210 Nicotine dependence, cigarettes, uncomplicated: Secondary | ICD-10-CM | POA: Diagnosis not present

## 2017-12-23 DIAGNOSIS — D469 Myelodysplastic syndrome, unspecified: Secondary | ICD-10-CM | POA: Diagnosis not present

## 2017-12-23 DIAGNOSIS — I10 Essential (primary) hypertension: Secondary | ICD-10-CM | POA: Diagnosis not present

## 2017-12-23 DIAGNOSIS — Z853 Personal history of malignant neoplasm of breast: Secondary | ICD-10-CM | POA: Diagnosis not present

## 2017-12-23 DIAGNOSIS — J449 Chronic obstructive pulmonary disease, unspecified: Secondary | ICD-10-CM | POA: Diagnosis not present

## 2017-12-24 DIAGNOSIS — I10 Essential (primary) hypertension: Secondary | ICD-10-CM | POA: Diagnosis not present

## 2017-12-24 DIAGNOSIS — J449 Chronic obstructive pulmonary disease, unspecified: Secondary | ICD-10-CM | POA: Diagnosis not present

## 2017-12-24 DIAGNOSIS — K449 Diaphragmatic hernia without obstruction or gangrene: Secondary | ICD-10-CM | POA: Diagnosis not present

## 2017-12-24 DIAGNOSIS — F17210 Nicotine dependence, cigarettes, uncomplicated: Secondary | ICD-10-CM | POA: Diagnosis not present

## 2017-12-24 DIAGNOSIS — Z853 Personal history of malignant neoplasm of breast: Secondary | ICD-10-CM | POA: Diagnosis not present

## 2017-12-24 DIAGNOSIS — D469 Myelodysplastic syndrome, unspecified: Secondary | ICD-10-CM | POA: Diagnosis not present

## 2017-12-25 ENCOUNTER — Inpatient Hospital Stay: Payer: Medicare Other

## 2017-12-25 ENCOUNTER — Inpatient Hospital Stay: Payer: Medicare Other | Attending: Oncology

## 2017-12-25 VITALS — BP 108/63 | HR 67 | Temp 96.8°F | Resp 18

## 2017-12-25 DIAGNOSIS — K449 Diaphragmatic hernia without obstruction or gangrene: Secondary | ICD-10-CM | POA: Diagnosis not present

## 2017-12-25 DIAGNOSIS — Z853 Personal history of malignant neoplasm of breast: Secondary | ICD-10-CM | POA: Insufficient documentation

## 2017-12-25 DIAGNOSIS — Z87440 Personal history of urinary (tract) infections: Secondary | ICD-10-CM | POA: Insufficient documentation

## 2017-12-25 DIAGNOSIS — G939 Disorder of brain, unspecified: Secondary | ICD-10-CM | POA: Insufficient documentation

## 2017-12-25 DIAGNOSIS — D696 Thrombocytopenia, unspecified: Secondary | ICD-10-CM | POA: Insufficient documentation

## 2017-12-25 DIAGNOSIS — F17210 Nicotine dependence, cigarettes, uncomplicated: Secondary | ICD-10-CM | POA: Diagnosis not present

## 2017-12-25 DIAGNOSIS — R630 Anorexia: Secondary | ICD-10-CM | POA: Insufficient documentation

## 2017-12-25 DIAGNOSIS — I700 Atherosclerosis of aorta: Secondary | ICD-10-CM | POA: Diagnosis not present

## 2017-12-25 DIAGNOSIS — D638 Anemia in other chronic diseases classified elsewhere: Secondary | ICD-10-CM

## 2017-12-25 DIAGNOSIS — J449 Chronic obstructive pulmonary disease, unspecified: Secondary | ICD-10-CM | POA: Insufficient documentation

## 2017-12-25 DIAGNOSIS — Z7982 Long term (current) use of aspirin: Secondary | ICD-10-CM | POA: Diagnosis not present

## 2017-12-25 DIAGNOSIS — R011 Cardiac murmur, unspecified: Secondary | ICD-10-CM | POA: Diagnosis not present

## 2017-12-25 DIAGNOSIS — Z79899 Other long term (current) drug therapy: Secondary | ICD-10-CM | POA: Diagnosis not present

## 2017-12-25 DIAGNOSIS — Z801 Family history of malignant neoplasm of trachea, bronchus and lung: Secondary | ICD-10-CM | POA: Diagnosis not present

## 2017-12-25 DIAGNOSIS — J9811 Atelectasis: Secondary | ICD-10-CM | POA: Diagnosis not present

## 2017-12-25 DIAGNOSIS — I119 Hypertensive heart disease without heart failure: Secondary | ICD-10-CM | POA: Diagnosis not present

## 2017-12-25 DIAGNOSIS — R634 Abnormal weight loss: Secondary | ICD-10-CM | POA: Diagnosis not present

## 2017-12-25 DIAGNOSIS — Z9012 Acquired absence of left breast and nipple: Secondary | ICD-10-CM | POA: Insufficient documentation

## 2017-12-25 DIAGNOSIS — D464 Refractory anemia, unspecified: Secondary | ICD-10-CM

## 2017-12-25 DIAGNOSIS — D469 Myelodysplastic syndrome, unspecified: Secondary | ICD-10-CM | POA: Insufficient documentation

## 2017-12-25 LAB — CBC WITH DIFFERENTIAL/PLATELET
BASOS ABS: 0 10*3/uL (ref 0–0.1)
Basophils Relative: 1 %
Eosinophils Absolute: 0 10*3/uL (ref 0–0.7)
Eosinophils Relative: 0 %
HCT: 22.9 % — ABNORMAL LOW (ref 35.0–47.0)
Hemoglobin: 7.9 g/dL — ABNORMAL LOW (ref 12.0–16.0)
LYMPHS ABS: 1.1 10*3/uL (ref 1.0–3.6)
Lymphocytes Relative: 61 %
MCH: 32.4 pg (ref 26.0–34.0)
MCHC: 34.5 g/dL (ref 32.0–36.0)
MCV: 93.7 fL (ref 80.0–100.0)
MONO ABS: 0.1 10*3/uL — AB (ref 0.2–0.9)
MONOS PCT: 6 %
NEUTROS PCT: 32 %
Neutro Abs: 0.5 10*3/uL — ABNORMAL LOW (ref 1.4–6.5)
Platelets: 92 10*3/uL — ABNORMAL LOW (ref 150–440)
RBC: 2.45 MIL/uL — AB (ref 3.80–5.20)
RDW: 18.8 % — ABNORMAL HIGH (ref 11.5–14.5)
Smear Review: DECREASED
WBC: 1.7 10*3/uL — AB (ref 3.6–11.0)

## 2017-12-25 LAB — SAMPLE TO BLOOD BANK

## 2017-12-25 MED ORDER — HEPARIN SOD (PORK) LOCK FLUSH 100 UNIT/ML IV SOLN: 500.0000 [IU] | Freq: Once | INTRAVENOUS | Status: DC

## 2017-12-25 MED ORDER — SODIUM CHLORIDE 0.9% FLUSH
10.0000 mL | Freq: Once | INTRAVENOUS | Status: AC
  Administered 2017-12-25: 10 mL via INTRAVENOUS
  Filled 2017-12-25: qty 10

## 2017-12-25 MED ORDER — HEPARIN SOD (PORK) LOCK FLUSH 100 UNIT/ML IV SOLN
500.0000 [IU] | Freq: Once | INTRAVENOUS | Status: AC
  Administered 2017-12-25: 500 [IU] via INTRAVENOUS
  Filled 2017-12-25: qty 5

## 2017-12-25 MED ORDER — SODIUM CHLORIDE 0.9% FLUSH
10.0000 mL | INTRAVENOUS | Status: DC | PRN
  Filled 2017-12-25: qty 10

## 2017-12-25 MED ORDER — EPOETIN ALFA 40000 UNIT/ML IJ SOLN
40000.0000 [IU] | Freq: Once | INTRAMUSCULAR | Status: AC
  Administered 2017-12-25: 40000 [IU] via SUBCUTANEOUS
  Filled 2017-12-25: qty 1

## 2017-12-25 NOTE — Progress Notes (Signed)
Reviewed 12/25/17 labs and Dr. Gary Fleet office note 12/18/17 with Faythe Casa NP. Per Faythe Casa NP proceed with Procrit only today, no need for blood transfusion at this time. Pt verbalizes understanding.

## 2017-12-26 DIAGNOSIS — D469 Myelodysplastic syndrome, unspecified: Secondary | ICD-10-CM | POA: Diagnosis not present

## 2017-12-26 DIAGNOSIS — I10 Essential (primary) hypertension: Secondary | ICD-10-CM | POA: Diagnosis not present

## 2017-12-26 DIAGNOSIS — Z853 Personal history of malignant neoplasm of breast: Secondary | ICD-10-CM | POA: Diagnosis not present

## 2017-12-26 DIAGNOSIS — J449 Chronic obstructive pulmonary disease, unspecified: Secondary | ICD-10-CM | POA: Diagnosis not present

## 2017-12-26 DIAGNOSIS — F17210 Nicotine dependence, cigarettes, uncomplicated: Secondary | ICD-10-CM | POA: Diagnosis not present

## 2017-12-26 DIAGNOSIS — K449 Diaphragmatic hernia without obstruction or gangrene: Secondary | ICD-10-CM | POA: Diagnosis not present

## 2017-12-26 NOTE — Progress Notes (Deleted)
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  INTERVAL HISTORY: Patient returns to clinic today for further evaluation and consideration of additional blood or Procrit. She continues to have increased weakness and fatigue, but admits this is slightly improved. She has had no further falls.  She has a poor appetite, but her weight has remained stable.  She has no neurologic complaints. She denies any recent fevers, chills, night sweats, or weight loss.  She denies any chest pain or shortness of breath. She has no nausea, vomiting, constipation, or diarrhea. She has no melena or hematochezia. She has no urinary complaints.  Patient offers no further specific complaints today.  REVIEW OF SYSTEMS:   Review of Systems  Constitutional: Positive for malaise/fatigue. Negative for fever and weight loss.  Eyes: Negative for blurred vision, double vision and pain.  Respiratory: Negative.  Negative for cough and shortness of breath.   Cardiovascular: Negative.  Negative for chest pain and leg swelling.  Gastrointestinal: Negative.  Negative for abdominal pain, blood in stool, constipation, diarrhea, melena, nausea and vomiting.  Genitourinary: Negative.   Musculoskeletal: Negative.  Negative for falls and joint pain.  Skin: Negative.  Negative for rash.  Neurological: Positive for weakness. Negative for dizziness and sensory change.  Psychiatric/Behavioral: Positive for memory loss. The patient is not nervous/anxious and does not have insomnia.     As per HPI. Otherwise, a complete review of systems is negative.  PAST MEDICAL HISTORY: Past Medical History:  Diagnosis Date  . Breast cancer ([place])    mastectomy  . COPD (chronic obstructive pulmonary disease) ([place])   . Heart murmur   . Hiatal hernia   . Hypertension   . UTI (lower urinary tract infection)   . UTI (urinary tract infection)    frequent in past    PAST SURGICAL HISTORY: Past Surgical History:  Procedure Laterality Date  . ABDOMINAL HYSTERECTOMY    . BONE MARROW ASPIRATION  2018  . FOOT SURGERY     for hammertoe  . MASTECTOMY Left    modified radical in August 1996  . PORTACATH PLACEMENT N/A 12/09/2017   Procedure: INSERTION PORT-A-CATH;  Surgeon: Vickie Epley, MD;  Location: [facility] ORS;  Service: Vascular;  Laterality: N/A;    FAMILY HISTORY Family History  Problem Relation Age of Onset  . Cancer Brother        lung  . Hypertension Brother   . Anemia Brother   . Myelodysplastic syndrome Brother        ADVANCED DIRECTIVES:    HEALTH MAINTENANCE: Social History   Tobacco Use  . Smoking status: Current Some Day Smoker    Packs/day: 0.25    Types: Cigarettes  . Smokeless tobacco: Never Used  Substance Use Topics  . Alcohol use: No  . Drug use: No     Colonoscopy:  PAP:  Bone density:  Lipid panel:  Allergies  Allergen Reactions  . Nitrofurantoin Other (See Comments)  . Penicillins Itching  . Sulfa Antibiotics Itching    Current Outpatient Medications  Medication Sig Dispense Refill  . acetaminophen (RA ACETAMINOPHEN) 650 MG CR tablet Take 650 mg by mouth every 8 (eight) hours as needed.     Marland Kitchen alendronate (FOSAMAX) 70 MG tablet Take  70 mg by mouth once a week. [place]  0  . Ascorbic Acid (VITAMIN C) 1000 MG tablet Take 1,000 mg by mouth daily.    Marland Kitchen aspirin 81 MG tablet Take 81 mg by mouth daily.    . calcium-vitamin D (OSCAL WITH D) 250-125 MG-UNIT per tablet Take 1 tablet by mouth daily.    Marland Kitchen gabapentin (NEURONTIN) 100 MG capsule Take 1 capsule by mouth at bedtime.    . Multiple Minerals (JOINT HEALTH MINERAL PO) Take 1 tablet by mouth daily.    . Multiple Vitamin (MULTIVITAMIN)  tablet Take 1 tablet by mouth daily.    . psyllium (REGULOID) 0.52 g capsule Take 0.52 g by mouth daily. Metamucil    . valsartan (DIOVAN) 40 MG tablet Take 40 mg by mouth daily.     No current facility-administered medications for this visit.     OBJECTIVE: There were no vitals filed for this visit.   There is no height or weight on file to calculate BMI.    ECOG FS:2 - Symptomatic, <50% confined to bed  General: Well-developed, well-nourished, no acute distress. Eyes: Pink conjunctiva, anicteric sclera. Lungs: Clear to auscultation bilaterally. Heart: Regular rate and rhythm. No rubs, murmurs, or gallops. Abdomen: Soft, nontender, nondistended. No organomegaly noted, normoactive bowel sounds. Musculoskeletal: No edema, cyanosis, or clubbing. Neuro: Alert, answering all questions appropriately. Cranial nerves grossly intact. Skin: No rashes or petechiae noted. Psych: Normal affect.  LAB RESULTS:  Lab Results  Component Value Date   NA 129 (L) 06/10/2014   K 4.3 06/10/2014   CL 96 (L) 06/10/2014   CO2 23 06/10/2014   GLUCOSE 140 (H) 06/10/2014   BUN 22 (H) 06/10/2014   CREATININE 1.24 06/10/2014   CALCIUM 10.3 (H) 06/10/2014   PROT 7.1 06/10/2014   ALBUMIN 2.3 (L) 06/10/2014   AST 11 (L) 06/10/2014   ALT 13 (L) 06/10/2014   ALKPHOS 70 06/10/2014   BILITOT 0.4 06/10/2014   GFRNONAA 40 (L) 06/10/2014   GFRAA 46 (L) 06/10/2014    Lab Results  Component Value Date   WBC 1.7 (L) 12/25/2017   NEUTROABS 0.5 (L) 12/25/2017   HGB 7.9 (L) 12/25/2017   HCT 22.9 (L) 12/25/2017   MCV 93.7 12/25/2017   PLT 92 (L) 12/25/2017   Lab Results  Component Value Date   FERRITIN 595 (H) 10/01/2017   Lab Results  Component Value Date   IRON 214 (H) 10/01/2017   TIBC 256 10/01/2017   IRONPCTSAT 83 (H) 10/01/2017      STUDIES: Dg Chest Port 1 View  Result Date: 12/09/2017 CLINICAL DATA:  Right IJ central line catheter placement. EXAM: PORTABLE CHEST 1 VIEW COMPARISON:  CXR  06/20/2014 FINDINGS: Hyperinflated lungs without pneumothorax. Right port catheter tip from an IJ approach is noted with tip in the distal SVC. Heart is normal in size. There is moderate aortic atherosclerosis without aneurysm. High-riding humeral heads right worse than left compatible with chronic rotator cuff tears. Degenerative osteoarthritic joint space narrowing and spurring is seen about both shoulders. IMPRESSION: 1. New right IJ port catheter is noted without pneumothorax. Tip is seen in the distal SVC. 2. Hyperinflated lungs. 3. Aortic atherosclerosis. Electronically Signed   By: Ashley Royalty M.D.   On: 12/09/2017 15:18   Dg C-arm 1-60 Min-no Report  Result Date: 12/09/2017 Fluoroscopy was utilized by the requesting physician.  No radiographic interpretation.    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  PLAN:    1. 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  Being the sole abnormality, is usually associated with a better outcome.  Patient also noted increased blast count, but unclear how much.  She also was noted to have an increase in clonal plasma cells possibly indicating an additional plasma cell dyscrasia.  Continue supportive care as needed.  Patient does not require transfusion today, but will proceed with 40,000 units Procrit.  Return to clinic in 1 week with repeat laboratory work and consideration of transfusion or Procrit.  Patient will then return to clinic in 2 weeks for further evaluation.   2.  History of breast cancer: No evidence of disease.  Patient's mammograms are ordered by her PCP. 3.  Hypertension: Blood pressure is within normal limits today. Continue treatment per PCP. 4.  Leukopenia: Secondary to underlying MDS. Bone marrow biopsy as above. 5.  Thrombocytopenia: Patient's platelet count is now within normal limits.   Monitor.   6.  Elevated iron stores: Patient will require Desferal with transfusions.  Approximately 30 minutes was spent in discussion of which greater than 50% was consultation.  Patient expressed understanding and was in agreement with this plan. She also understands that She can call clinic at any time with any questions, concerns, or complaints.    [name], MD 12/26/17 10:51 PM

## 2017-12-29 DIAGNOSIS — K449 Diaphragmatic hernia without obstruction or gangrene: Secondary | ICD-10-CM | POA: Diagnosis not present

## 2017-12-29 DIAGNOSIS — Z853 Personal history of malignant neoplasm of breast: Secondary | ICD-10-CM | POA: Diagnosis not present

## 2017-12-29 DIAGNOSIS — J449 Chronic obstructive pulmonary disease, unspecified: Secondary | ICD-10-CM | POA: Diagnosis not present

## 2017-12-29 DIAGNOSIS — I10 Essential (primary) hypertension: Secondary | ICD-10-CM | POA: Diagnosis not present

## 2017-12-29 DIAGNOSIS — F17210 Nicotine dependence, cigarettes, uncomplicated: Secondary | ICD-10-CM | POA: Diagnosis not present

## 2017-12-29 DIAGNOSIS — D469 Myelodysplastic syndrome, unspecified: Secondary | ICD-10-CM | POA: Diagnosis not present

## 2017-12-30 DIAGNOSIS — K449 Diaphragmatic hernia without obstruction or gangrene: Secondary | ICD-10-CM | POA: Diagnosis not present

## 2017-12-30 DIAGNOSIS — F17210 Nicotine dependence, cigarettes, uncomplicated: Secondary | ICD-10-CM | POA: Diagnosis not present

## 2017-12-30 DIAGNOSIS — D469 Myelodysplastic syndrome, unspecified: Secondary | ICD-10-CM | POA: Diagnosis not present

## 2017-12-30 DIAGNOSIS — J449 Chronic obstructive pulmonary disease, unspecified: Secondary | ICD-10-CM | POA: Diagnosis not present

## 2017-12-30 DIAGNOSIS — Z853 Personal history of malignant neoplasm of breast: Secondary | ICD-10-CM | POA: Diagnosis not present

## 2017-12-30 DIAGNOSIS — I10 Essential (primary) hypertension: Secondary | ICD-10-CM | POA: Diagnosis not present

## 2018-01-01 ENCOUNTER — Other Ambulatory Visit: Payer: Self-pay

## 2018-01-01 ENCOUNTER — Ambulatory Visit: Payer: Self-pay

## 2018-01-01 ENCOUNTER — Emergency Department: Payer: Medicare Other

## 2018-01-01 ENCOUNTER — Emergency Department
Admission: EM | Admit: 2018-01-01 | Discharge: 2018-01-01 | Disposition: A | Discharge status: Home / Self Care | Payer: Medicare Other | Attending: Emergency Medicine | Admitting: Emergency Medicine

## 2018-01-01 ENCOUNTER — Inpatient Hospital Stay: Payer: Medicare Other

## 2018-01-01 ENCOUNTER — Telehealth: Payer: Self-pay | Admitting: *Deleted

## 2018-01-01 ENCOUNTER — Encounter: Payer: Self-pay | Admitting: Emergency Medicine

## 2018-01-01 ENCOUNTER — Inpatient Hospital Stay: Payer: Medicare Other | Admitting: Oncology

## 2018-01-01 DIAGNOSIS — F17210 Nicotine dependence, cigarettes, uncomplicated: Secondary | ICD-10-CM | POA: Diagnosis not present

## 2018-01-01 DIAGNOSIS — J449 Chronic obstructive pulmonary disease, unspecified: Secondary | ICD-10-CM | POA: Insufficient documentation

## 2018-01-01 DIAGNOSIS — K449 Diaphragmatic hernia without obstruction or gangrene: Secondary | ICD-10-CM | POA: Diagnosis not present

## 2018-01-01 DIAGNOSIS — M6281 Muscle weakness (generalized): Secondary | ICD-10-CM | POA: Diagnosis not present

## 2018-01-01 DIAGNOSIS — N390 Urinary tract infection, site not specified: Secondary | ICD-10-CM

## 2018-01-01 DIAGNOSIS — E860 Dehydration: Secondary | ICD-10-CM | POA: Diagnosis not present

## 2018-01-01 DIAGNOSIS — R531 Weakness: Secondary | ICD-10-CM

## 2018-01-01 DIAGNOSIS — J9811 Atelectasis: Secondary | ICD-10-CM | POA: Diagnosis not present

## 2018-01-01 DIAGNOSIS — I10 Essential (primary) hypertension: Secondary | ICD-10-CM | POA: Diagnosis not present

## 2018-01-01 DIAGNOSIS — Z79899 Other long term (current) drug therapy: Secondary | ICD-10-CM | POA: Diagnosis not present

## 2018-01-01 DIAGNOSIS — D469 Myelodysplastic syndrome, unspecified: Secondary | ICD-10-CM | POA: Diagnosis not present

## 2018-01-01 DIAGNOSIS — Z853 Personal history of malignant neoplasm of breast: Secondary | ICD-10-CM | POA: Diagnosis not present

## 2018-01-01 DIAGNOSIS — R42 Dizziness and giddiness: Secondary | ICD-10-CM | POA: Diagnosis not present

## 2018-01-01 LAB — CBC
HEMATOCRIT: 23.2 % — AB (ref 35.0–47.0)
Hemoglobin: 7.9 g/dL — ABNORMAL LOW (ref 12.0–16.0)
MCH: 32.1 pg (ref 26.0–34.0)
MCHC: 34.1 g/dL (ref 32.0–36.0)
MCV: 94.2 fL (ref 80.0–100.0)
Platelets: 82 10*3/uL — ABNORMAL LOW (ref 150–440)
RBC: 2.46 MIL/uL — ABNORMAL LOW (ref 3.80–5.20)
RDW: 19.7 % — AB (ref 11.5–14.5)
WBC: 3.4 10*3/uL — ABNORMAL LOW (ref 3.6–11.0)

## 2018-01-01 LAB — URINALYSIS, COMPLETE (UACMP) WITH MICROSCOPIC
Bilirubin Urine: NEGATIVE
Glucose, UA: NEGATIVE mg/dL
Hgb urine dipstick: NEGATIVE
Ketones, ur: NEGATIVE mg/dL
Leukocytes, UA: NEGATIVE
Nitrite: POSITIVE — AB
PROTEIN: NEGATIVE mg/dL
SQUAMOUS EPITHELIAL / LPF: NONE SEEN
Specific Gravity, Urine: 1.015 (ref 1.005–1.030)
pH: 6 (ref 5.0–8.0)

## 2018-01-01 LAB — TROPONIN I

## 2018-01-01 LAB — BASIC METABOLIC PANEL
Anion gap: 9 (ref 5–15)
BUN: 36 mg/dL — ABNORMAL HIGH (ref 6–20)
CO2: 24 mmol/L (ref 22–32)
CREATININE: 1.2 mg/dL — AB (ref 0.44–1.00)
Calcium: 10 mg/dL (ref 8.9–10.3)
Chloride: 101 mmol/L (ref 101–111)
GFR calc Af Amer: 45 mL/min — ABNORMAL LOW (ref >60)
GFR, EST NON AFRICAN AMERICAN: 39 mL/min — AB (ref >60)
GLUCOSE: 111 mg/dL — AB (ref 65–99)
POTASSIUM: 4.4 mmol/L (ref 3.5–5.1)
Sodium: 134 mmol/L — ABNORMAL LOW (ref 135–145)

## 2018-01-01 MED ORDER — HEPARIN SOD (PORK) LOCK FLUSH 10 UNIT/ML IV SOLN: 10.0000 [IU] | Freq: Once | INTRAVENOUS | Status: DC

## 2018-01-01 MED ORDER — SODIUM CHLORIDE 0.9 % IV BOLUS (SEPSIS)
1000.0000 mL | Freq: Once | INTRAVENOUS | Status: AC
  Administered 2018-01-01: 1000 mL via INTRAVENOUS

## 2018-01-01 MED ORDER — SODIUM CHLORIDE 0.9 % IV SOLN: Freq: Once | INTRAVENOUS | Status: DC

## 2018-01-01 MED ORDER — CEPHALEXIN 500 MG PO CAPS
500.0000 mg | ORAL_CAPSULE | Freq: Once | ORAL | Status: AC
  Administered 2018-01-01: 500 mg via ORAL
  Filled 2018-01-01: qty 1

## 2018-01-01 MED ORDER — CEPHALEXIN 500 MG PO CAPS: 500.0000 mg | ORAL_CAPSULE | Freq: Three times a day (TID) | ORAL | 0 refills | Status: DC

## 2018-01-01 MED ORDER — HEPARIN SOD (PORK) LOCK FLUSH 100 UNIT/ML IV SOLN
INTRAVENOUS | Status: AC
  Filled 2018-01-01: qty 5

## 2018-01-01 MED ORDER — HEPARIN SOD (PORK) LOCK FLUSH 100 UNIT/ML IV SOLN
500.0000 [IU] | Freq: Once | INTRAVENOUS | Status: AC
  Administered 2018-01-01: 500 [IU] via INTRAVENOUS

## 2018-01-01 NOTE — ED Provider Notes (Signed)
[facility] Emergency Department Provider Note  ____________________________________________   First MD Initiated Contact with Patient 01/01/18 1127     (approximate)  I have reviewed the triage vital signs and the nursing notes.   HISTORY  Chief Complaint Weakness   HPI Chelsea Horne is a 84 y.o. female with a history of myelodysplastic syndrome status post multiple blood transfusions was presented with weakness today.  The patient denies any complaints.  She says that she is not experiencing any pain or dizziness at this time.  However, she says that she was "dizzy" prior to arrival.  She is unable to differentiate between the room spinning or feeling like she was going to pass out.  Concern was from her daughter who says that the patient was unable to walk to the bathroom because of generalized weakness this morning.  However, once EMS was called with the patient was able to walk out to the ambulance.  Past Medical History:  Diagnosis Date  . Breast cancer ([place])    mastectomy  . COPD (chronic obstructive pulmonary disease) ([place])   . Heart murmur   . Hiatal hernia   . Hypertension   . UTI (lower urinary tract infection)   . UTI (urinary tract infection)    frequent in past    Patient Active Problem List   Diagnosis Date Noted  . Complete tear of right rotator cuff 05/03/2016  . Rotator cuff arthropathy, right 05/03/2016  . Myelodysplastic syndrome ([place]) 03/13/2015    Past Surgical History:  Procedure Laterality Date  . ABDOMINAL HYSTERECTOMY    . BONE MARROW ASPIRATION  2018  . FOOT SURGERY     for hammertoe  . MASTECTOMY Left    modified radical in August 1996  . PORTACATH PLACEMENT N/A 12/09/2017   Procedure: INSERTION PORT-A-CATH;  Surgeon: Vickie Epley, MD;  Location: [facility] ORS;  Service: Vascular;  Laterality: N/A;    Prior to Admission medications   Medication Sig Start Date End Date Taking? Authorizing Provider    acetaminophen (RA ACETAMINOPHEN) 650 MG CR tablet Take 650 mg by mouth every 8 (eight) hours as needed.     [provider]  alendronate (FOSAMAX) 70 MG tablet Take 70 mg by mouth once a week. [place] 04/01/15   [provider]  Ascorbic Acid (VITAMIN C) 1000 MG tablet Take 1,000 mg by mouth daily.    [provider]  aspirin 81 MG tablet Take 81 mg by mouth daily.    [provider]  calcium-vitamin D (OSCAL WITH D) 250-125 MG-UNIT per tablet Take 1 tablet by mouth daily.    [provider]  gabapentin (NEURONTIN) 100 MG capsule Take 1 capsule by mouth at bedtime. 02/10/16   [provider]  Multiple Minerals (JOINT HEALTH MINERAL PO) Take 1 tablet by mouth daily.    [provider]  Multiple Vitamin (MULTIVITAMIN) tablet Take 1 tablet by mouth daily.    [provider]  psyllium (REGULOID) 0.52 g capsule Take 0.52 g by mouth daily. Metamucil    [provider]  valsartan (DIOVAN) 40 MG tablet Take 40 mg by mouth daily.    [provider]    Allergies Nitrofurantoin; Penicillins; and Sulfa antibiotics  Family History  Problem Relation Age of Onset  . Cancer Brother        lung  . Hypertension Brother   . Anemia Brother   . Myelodysplastic syndrome Brother     Social History Social History  Tobacco Use  . Smoking status: Current Some Day Smoker    Packs/day: 0.25    Types: Cigarettes  . Smokeless tobacco: Never Used  Substance Use Topics  . Alcohol use: No  . Drug use: No    Review of Systems  Constitutional: No fever/chills Eyes: No visual changes. ENT: No sore throat. Cardiovascular: Denies chest pain. Respiratory: Denies shortness of breath. Gastrointestinal: No abdominal pain.  No nausea, no vomiting.  No diarrhea.  No constipation. Genitourinary: Negative for dysuria. Musculoskeletal: Negative for back pain. Skin: Negative for rash. Neurological: Negative for headaches,  focal weakness or numbness.   ____________________________________________   PHYSICAL EXAM:  VITAL SIGNS: ED Triage Vitals [01/01/18 1125]  Enc Vitals Group     BP (!) 128/43     Pulse Rate 71     Resp 20     Temp (!) 97.5 F (36.4 C)     Temp Source Oral     SpO2 100 %     Weight 115 lb (52.2 kg)     Height 5\' 7"  (1.702 m)     Head Circumference      Peak Flow      Pain Score      Pain Loc      Pain Edu?      Excl. in [place]?     Constitutional: Alert and oriented. Well appearing and in no acute distress. Eyes: Conjunctivae are mildly pale.  Head: Atraumatic. Nose: No congestion/rhinnorhea. Mouth/Throat: Mucous membranes are moist.  Neck: No stridor.   Cardiovascular: Normal rate, regular rhythm. Grossly normal heart sounds.  Respiratory: Normal respiratory effort.  No retractions. Lungs CTAB. Gastrointestinal: Soft and nontender. No distention. Musculoskeletal: No lower extremity tenderness nor edema.  No joint effusions. Neurologic:  Normal speech and language. No gross focal neurologic deficits are appreciated. Skin:  Skin is warm, dry and intact. No rash noted. Psychiatric: Mood and affect are normal. Speech and behavior are normal.  ____________________________________________   LABS (all labs ordered are listed, but only abnormal results are displayed)  Labs Reviewed  BASIC METABOLIC PANEL  CBC  URINALYSIS, COMPLETE (UACMP) WITH MICROSCOPIC  TROPONIN I  TYPE AND SCREEN   ____________________________________________  EKG  ED ECG REPORT I, Doran Stabler, the attending physician, personally viewed and interpreted this ECG.   Date: 01/01/2018  EKG Time: 1122  Rate: 69  Rhythm: normal sinus rhythm with multiple PVCs.  Axis: Normal  Intervals:none  ST&T Change: No ST segment elevation or depression.  No abnormal T wave  inversion.  ____________________________________________  RADIOLOGY  Atelectasis ____________________________________________   PROCEDURES  Procedure(s) performed:   Procedures  Critical Care performed:   ____________________________________________   INITIAL IMPRESSION / ASSESSMENT AND PLAN / ED COURSE  Pertinent labs & imaging results that were available during my care of the patient were reviewed by me and considered in my medical decision making (see chart for details).  Differential diagnosis includes, but is not limited to, alcohol, illicit or prescription medications, or other toxic ingestion; intracranial pathology such as stroke or intracerebral hemorrhage; fever or infectious causes including sepsis; hypoxemia and/or hypercarbia; uremia; trauma; endocrine related disorders such as diabetes, hypoglycemia, and thyroid-related diseases; hypertensive encephalopathy; etc.  As part of my medical decision making, I reviewed the following data within the [facility] chart reviewed  ----------------------------------------- 1:56 PM on 01/01/2018 -----------------------------------------  Patient found to be slightly dehydrated and with a urinary tract infection.  On discussion with the family they state that  she has been weaker lately and has not had a decreased p.o. intake.  I also discussed the case with Dr. Grayland Ormond he says that he will be calling the patient to reschedule her Procrit infusion for today.  Otherwise, we will continue with the plan for IV hydration and antibiotics in the emergency department and likely discharged home as long as the patient is feeling improved.    ----------------------------------------- 3:31 PM on 01/01/2018 -----------------------------------------  Patient at this time without any complaints.  Feeling improved with fluids as well as Keflex.  Able to ambulate with her baseline ambulation which required assistance.   Family says that this is her baseline level of walking.  We discussed make sure the patient is eating and drinking.  We also discussed plan for home health and the family will be following up with Dr. Grayland Ormond who established home health initially.  Patient to follow with Dr. Grayland Ormond, likely tomorrow.  Will be discharged at this time.  ____________________________________________   FINAL CLINICAL IMPRESSION(S) / ED DIAGNOSES  Weakness.  UTI.  Dehydration.    NEW MEDICATIONS STARTED DURING THIS VISIT:  New Prescriptions   No medications on file     Note:  This document was prepared using Dragon voice recognition software and may include unintentional dictation errors.     Orbie Pyo, MD 01/01/18 [phone]

## 2018-01-01 NOTE — ED Triage Notes (Signed)
Pt comes into the ED via [facility] from home where she has had increased weakness more than normal.  Patient has regular blood transfusions for anemia and was due to go today but the family believed she was too weak to make it to the appt.  Patent's daughter states that she hasn't been able to get up and walk like normal.  EMS said the patient got up walked and greeted them to get in the truck. Patient neurologically intact at this time and has even and unlabored respirations.

## 2018-01-01 NOTE — ED Notes (Signed)
Patient transported to X-ray 

## 2018-01-01 NOTE — ED Notes (Signed)
ED Provider at bedside. 

## 2018-01-01 NOTE — ED Notes (Signed)
Port accessed for blood draw, patient tolerated this well with no complaints.

## 2018-01-01 NOTE — Telephone Encounter (Signed)
Received a call from daughter and neighbor to report that patient is unable to come to appointment today due to extreme weakness, dehydration, has not voided since last evening, not eating or drinking. Cannot get her out of bed to come to appointment today. She asked if someone could come there to see her, I explained that we do not make house calls and  I suggested they call EMS to have her taken to ER for evaluation and treatment, She has concerns about her getting exposed to flu in ER. I explained that when EMS takes patients to ER they usually go straight back to room. She asked that I cancel her appointment today and let doctor know that she will probably be going to ER. Appointment cancelled and inf nurse informed

## 2018-01-02 DIAGNOSIS — J449 Chronic obstructive pulmonary disease, unspecified: Secondary | ICD-10-CM | POA: Diagnosis not present

## 2018-01-02 DIAGNOSIS — Z853 Personal history of malignant neoplasm of breast: Secondary | ICD-10-CM | POA: Diagnosis not present

## 2018-01-02 DIAGNOSIS — F17210 Nicotine dependence, cigarettes, uncomplicated: Secondary | ICD-10-CM | POA: Diagnosis not present

## 2018-01-02 DIAGNOSIS — K449 Diaphragmatic hernia without obstruction or gangrene: Secondary | ICD-10-CM | POA: Diagnosis not present

## 2018-01-02 DIAGNOSIS — I10 Essential (primary) hypertension: Secondary | ICD-10-CM | POA: Diagnosis not present

## 2018-01-02 DIAGNOSIS — D469 Myelodysplastic syndrome, unspecified: Secondary | ICD-10-CM | POA: Diagnosis not present

## 2018-01-02 LAB — TYPE AND SCREEN
ABO/RH(D): O POS
Antibody Screen: POSITIVE
UNIT DIVISION: 0
Unit division: 0

## 2018-01-02 LAB — BPAM RBC
Blood Product Expiration Date: 201903132359
Blood Product Expiration Date: 201903142359
UNIT TYPE AND RH: 5100
UNIT TYPE AND RH: 5100

## 2018-01-03 LAB — URINE CULTURE: Culture: >=100000 — AB

## 2018-01-04 ENCOUNTER — Emergency Department: Payer: Medicare Other

## 2018-01-04 ENCOUNTER — Other Ambulatory Visit: Payer: Self-pay

## 2018-01-04 ENCOUNTER — Emergency Department
Admission: EM | Admit: 2018-01-04 | Discharge: 2018-01-05 | Disposition: A | Discharge status: Home / Self Care | Payer: Medicare Other | Attending: Emergency Medicine | Admitting: Emergency Medicine

## 2018-01-04 DIAGNOSIS — Z853 Personal history of malignant neoplasm of breast: Secondary | ICD-10-CM | POA: Insufficient documentation

## 2018-01-04 DIAGNOSIS — R42 Dizziness and giddiness: Secondary | ICD-10-CM | POA: Insufficient documentation

## 2018-01-04 DIAGNOSIS — M6281 Muscle weakness (generalized): Secondary | ICD-10-CM | POA: Diagnosis not present

## 2018-01-04 DIAGNOSIS — Z7982 Long term (current) use of aspirin: Secondary | ICD-10-CM | POA: Insufficient documentation

## 2018-01-04 DIAGNOSIS — F17210 Nicotine dependence, cigarettes, uncomplicated: Secondary | ICD-10-CM | POA: Diagnosis not present

## 2018-01-04 DIAGNOSIS — R4020 Unspecified coma: Secondary | ICD-10-CM | POA: Diagnosis not present

## 2018-01-04 DIAGNOSIS — Z79899 Other long term (current) drug therapy: Secondary | ICD-10-CM | POA: Insufficient documentation

## 2018-01-04 DIAGNOSIS — I10 Essential (primary) hypertension: Secondary | ICD-10-CM | POA: Insufficient documentation

## 2018-01-04 DIAGNOSIS — J449 Chronic obstructive pulmonary disease, unspecified: Secondary | ICD-10-CM | POA: Diagnosis not present

## 2018-01-04 LAB — COMPREHENSIVE METABOLIC PANEL
ALK PHOS: 45 U/L (ref 38–126)
ALT: 14 U/L (ref 14–54)
ANION GAP: 6 (ref 5–15)
AST: 25 U/L (ref 15–41)
Albumin: 3.4 g/dL — ABNORMAL LOW (ref 3.5–5.0)
BUN: 19 mg/dL (ref 6–20)
CALCIUM: 8.8 mg/dL — AB (ref 8.9–10.3)
CHLORIDE: 104 mmol/L (ref 101–111)
CO2: 24 mmol/L (ref 22–32)
Creatinine, Ser: 0.72 mg/dL (ref 0.44–1.00)
GFR calc non Af Amer: >60 mL/min (ref >60)
Glucose, Bld: 94 mg/dL (ref 65–99)
Potassium: 3.7 mmol/L (ref 3.5–5.1)
SODIUM: 134 mmol/L — AB (ref 135–145)
Total Bilirubin: 1.3 mg/dL — ABNORMAL HIGH (ref 0.3–1.2)
Total Protein: 6.2 g/dL — ABNORMAL LOW (ref 6.5–8.1)

## 2018-01-04 LAB — CBC WITH DIFFERENTIAL/PLATELET
BASOS PCT: 0 %
Band Neutrophils: 0 %
Basophils Absolute: 0 10*3/uL (ref 0–0.1)
Blasts: 0 %
Eosinophils Absolute: 0 10*3/uL (ref 0–0.7)
Eosinophils Relative: 0 %
HEMATOCRIT: 22.4 % — AB (ref 35.0–47.0)
HEMOGLOBIN: 7.5 g/dL — AB (ref 12.0–16.0)
LYMPHS PCT: 72 %
Lymphs Abs: 1.1 10*3/uL (ref 1.0–3.6)
MCH: 31.7 pg (ref 26.0–34.0)
MCHC: 33.4 g/dL (ref 32.0–36.0)
MCV: 94.9 fL (ref 80.0–100.0)
MONO ABS: 0.1 10*3/uL — AB (ref 0.2–0.9)
Metamyelocytes Relative: 0 %
Monocytes Relative: 6 %
Myelocytes: 0 %
NEUTROS PCT: 22 %
NRBC: 0 /100{WBCs}
Neutro Abs: 0.4 10*3/uL — ABNORMAL LOW (ref 1.4–6.5)
OTHER: 0 %
PROMYELOCYTES ABS: 0 %
Platelets: 70 10*3/uL — ABNORMAL LOW (ref 150–440)
RBC: 2.36 MIL/uL — AB (ref 3.80–5.20)
RDW: 20.6 % — ABNORMAL HIGH (ref 11.5–14.5)
WBC: 1.6 10*3/uL — ABNORMAL LOW (ref 3.6–11.0)

## 2018-01-04 LAB — TROPONIN I: Troponin I: <0.03 ng/mL (ref <0.03)

## 2018-01-04 LAB — URINALYSIS, COMPLETE (UACMP) WITH MICROSCOPIC
BACTERIA UA: NONE SEEN
BILIRUBIN URINE: NEGATIVE
Glucose, UA: NEGATIVE mg/dL
HGB URINE DIPSTICK: NEGATIVE
KETONES UR: NEGATIVE mg/dL
Leukocytes, UA: NEGATIVE
Nitrite: NEGATIVE
Protein, ur: NEGATIVE mg/dL
Specific Gravity, Urine: 1.015 (ref 1.005–1.030)
pH: 6 (ref 5.0–8.0)

## 2018-01-04 MED ORDER — SODIUM CHLORIDE 0.9 % IV BOLUS (SEPSIS)
500.0000 mL | Freq: Once | INTRAVENOUS | Status: AC
  Administered 2018-01-04: 500 mL via INTRAVENOUS

## 2018-01-04 MED ORDER — MECLIZINE HCL 25 MG PO TABS
25.0000 mg | ORAL_TABLET | Freq: Once | ORAL | Status: AC
  Administered 2018-01-04: 25 mg via ORAL
  Filled 2018-01-04: qty 1

## 2018-01-04 MED ORDER — GABAPENTIN 100 MG PO CAPS
100.0000 mg | ORAL_CAPSULE | Freq: Every day | ORAL | Status: DC
  Administered 2018-01-05: 100 mg via ORAL
  Filled 2018-01-04 (×2): qty 1

## 2018-01-04 MED ORDER — PSYLLIUM 95 % PO PACK
1.0000 | PACK | Freq: Every day | ORAL | Status: DC
  Administered 2018-01-05: 1 via ORAL
  Filled 2018-01-04: qty 1

## 2018-01-04 MED ORDER — CALCIUM CARBONATE-VITAMIN D 500-200 MG-UNIT PO TABS
1.0000 | ORAL_TABLET | Freq: Every day | ORAL | Status: DC
  Administered 2018-01-05: 1 via ORAL
  Filled 2018-01-04: qty 1

## 2018-01-04 MED ORDER — IRBESARTAN 75 MG PO TABS
37.5000 mg | ORAL_TABLET | Freq: Every day | ORAL | Status: DC
  Administered 2018-01-05: 37.5 mg via ORAL
  Filled 2018-01-04: qty 0.5

## 2018-01-04 MED ORDER — ASPIRIN EC 81 MG PO TBEC
81.0000 mg | DELAYED_RELEASE_TABLET | Freq: Every day | ORAL | Status: DC
  Administered 2018-01-05: 81 mg via ORAL
  Filled 2018-01-04: qty 1

## 2018-01-04 NOTE — ED Notes (Signed)
Helped pt pivot transfer to the toilet. Was able to follow simple commands. Stated at one pt that she felt dizzy, no nystagmus, warm and pink still. Helped pt to stand and she took a few steps to get to the head of the bed to sit down.

## 2018-01-04 NOTE — Progress Notes (Signed)
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  INTERVAL HISTORY: Patient returns to clinic today for further evaluation and consideration of additional blood or Procrit.  Her performance status continues to decline.  She was recently evaluated in the emergency room for increased weakness and fatigue, but was not admitted to the hospital.  She has a poor appetite and is losing weight. She has no neurologic complaints. She denies any recent fevers, chills, night sweats, or weight loss.  She denies any chest pain or shortness of breath. She has no nausea, vomiting, constipation, or diarrhea. She has no melena or hematochezia. She has no urinary complaints.  Patient offers no further specific complaints today.  REVIEW OF SYSTEMS:   Review of Systems  Constitutional: Positive for malaise/fatigue and weight loss. Negative for fever.  Eyes: Negative for blurred vision, double vision and pain.  Respiratory: Negative.  Negative for cough and shortness of breath.   Cardiovascular: Negative.  Negative for chest pain and leg swelling.  Gastrointestinal: Negative.  Negative for abdominal pain, blood in stool, constipation, diarrhea, melena, nausea and vomiting.  Genitourinary: Negative.   Musculoskeletal: Negative.  Negative for falls and joint pain.  Skin: Negative.  Negative for rash.  Neurological: Positive for weakness. Negative for dizziness and sensory change.  Psychiatric/Behavioral: Positive for memory loss. The patient is not nervous/anxious and does not have insomnia.     As per HPI. Otherwise, a complete review of systems is negative.  PAST MEDICAL HISTORY: Past Medical History:  Diagnosis  Date  . Breast cancer ([place])    mastectomy  . COPD (chronic obstructive pulmonary disease) ([place])   . Heart murmur   . Hiatal hernia   . Hypertension   . UTI (lower urinary tract infection)   . UTI (urinary tract infection)    frequent in past    PAST SURGICAL HISTORY: Past Surgical History:  Procedure Laterality Date  . ABDOMINAL HYSTERECTOMY    . BONE MARROW ASPIRATION  2018  . FOOT SURGERY     for hammertoe  . MASTECTOMY Left    modified radical in August 1996  . PORTACATH PLACEMENT N/A 12/09/2017   Procedure: INSERTION PORT-A-CATH;  Surgeon: Vickie Epley, MD;  Location: [facility] ORS;  Service: Vascular;  Laterality: N/A;    FAMILY HISTORY Family History  Problem Relation Age of Onset  . Cancer Brother        lung  . Hypertension Brother   . Anemia Brother   . Myelodysplastic syndrome Brother        ADVANCED DIRECTIVES:    HEALTH MAINTENANCE: Social History   Tobacco Use  . Smoking status: Current Some Day Smoker    Packs/day: 0.25    Types: Cigarettes  . Smokeless tobacco: Never Used  Substance Use Topics  . Alcohol use: No  . Drug use: No     Colonoscopy:  PAP:  Bone density:  Lipid panel:  Allergies  Allergen Reactions  . Nitrofurantoin Other (See Comments)  . Penicillins Itching    Has patient had a PCN reaction causing immediate rash, facial/tongue/throat swelling, SOB or lightheadedness with hypotension: Yes Has patient had a PCN reaction causing severe rash involving mucus membranes or skin necrosis: Unknown Has patient had a PCN  reaction that required hospitalization: Unknown Has patient had a PCN reaction occurring within the last 10 years: Yes If all of the above answers are "NO", then may proceed with Cephalosporin use.  . Sulfa Antibiotics Itching    Current Outpatient Medications  Medication Sig Dispense Refill  . Ascorbic Acid (VITAMIN C) 1000 MG tablet Take 1,000 mg by mouth daily.    Marland Kitchen aspirin 81 MG tablet Take 81 mg by mouth  daily.    . calcium-vitamin D (OSCAL WITH D) 250-125 MG-UNIT per tablet Take 1 tablet by mouth daily.    . cephALEXin (KEFLEX) 500 MG capsule Take 500 mg by mouth 3 (three) times daily.    Marland Kitchen gabapentin (NEURONTIN) 100 MG capsule Take 1 capsule by mouth at bedtime.    . meclizine (ANTIVERT) 25 MG tablet Take 1 tablet (25 mg total) by mouth 3 (three) times daily as needed for dizziness. 30 tablet 0  . Multiple Minerals (JOINT HEALTH MINERAL PO) Take 1 tablet by mouth daily.    . Multiple Vitamin (MULTIVITAMIN) tablet Take 1 tablet by mouth daily.    . psyllium (REGULOID) 0.52 g capsule Take 0.52 g by mouth daily. Metamucil    . valsartan (DIOVAN) 40 MG tablet Take 40 mg by mouth daily.    . valsartan-hydrochlorothiazide (DIOVAN-HCT) 80-12.5 MG tablet     . acetaminophen (RA ACETAMINOPHEN) 650 MG CR tablet Take 650 mg by mouth every 8 (eight) hours as needed.     Marland Kitchen alendronate (FOSAMAX) 70 MG tablet Take 70 mg by mouth once a week. Satur[place]  0  . megestrol (MEGACE) 40 MG tablet Take 1 tablet (40 mg total) by mouth daily. 30 tablet 2   No current facility-administered medications for this visit.    Facility-Administered Medications Ordered in Other Visits  Medication Dose Route Frequency Provider Last Rate Last Dose  . 0.9 %  sodium chloride infusion  250 mL Intravenous Once [name], MD      . acetaminophen (TYLENOL) tablet 650 mg  650 mg Oral Once [name], MD      . diphenhydrAMINE (BENADRYL) injection 25 mg  25 mg Intravenous Once [name], MD      . heparin lock flush 100 unit/mL  500 Units Intracatheter Daily PRN [name], MD        OBJECTIVE: Vitals:   01/08/18 0945  BP: (!) 155/73  Pulse: 67  Resp: 18  Temp: (!) 94.8 F (34.9 C)     Body mass index is 17.15 kg/m.    ECOG FS:2 - Symptomatic, <50% confined to bed  General: Well-developed, well-nourished, no acute distress. Eyes: Pink conjunctiva, anicteric sclera. Lungs: Clear to  auscultation bilaterally. Heart: Regular rate and rhythm. No rubs, murmurs, or gallops. Abdomen: Soft, nontender, nondistended. No organomegaly noted, normoactive bowel sounds. Musculoskeletal: No edema, cyanosis, or clubbing. Neuro: Alert, answering all questions appropriately. Cranial nerves grossly intact. Skin: No rashes or petechiae noted. Psych: Normal affect.  LAB RESULTS:  Lab Results  Component Value Date   NA 134 (L) 01/04/2018   K 3.7 01/04/2018   CL 104 01/04/2018   CO2 24 01/04/2018   GLUCOSE 94 01/04/2018   BUN 19 01/04/2018   CREATININE 0.72 01/04/2018   CALCIUM 8.8 (L) 01/04/2018   PROT 6.2 (L) 01/04/2018   ALBUMIN 3.4 (L) 01/04/2018   AST 25 01/04/2018   ALT 14 01/04/2018   ALKPHOS 45 01/04/2018   BILITOT 1.3 (H) 01/04/2018   GFRNONAA >60 01/04/2018  GFRAA >60 01/04/2018    Lab Results  Component Value Date   WBC 1.8 (L) 01/08/2018   NEUTROABS 0.5 (L) 01/08/2018   HGB 7.6 (L) 01/08/2018   HCT 22.2 (L) 01/08/2018   MCV 97.0 01/08/2018   PLT 75 (L) 01/08/2018   Lab Results  Component Value Date   FERRITIN 595 (H) 10/01/2017   Lab Results  Component Value Date   IRON 214 (H) 10/01/2017   TIBC 256 10/01/2017   IRONPCTSAT 83 (H) 10/01/2017      STUDIES: Dg Chest 2 View  Result Date: 01/01/2018 CLINICAL DATA:  Increase weakness.  Anemia. EXAM: CHEST  2 VIEW COMPARISON:  12/09/2017. FINDINGS: PowerPort catheter noted with tip over the. Superior vena cava. Lungs are clear. Cardiomegaly with normal pulmonary vascularity. Mild right base subsegmental atelectasis. No pleural effusion or pneumothorax. IMPRESSION: PowerPort catheter noted with tip over the superior vena cava. 2.  Cardiomegaly with normal pulmonary vascularity. 3.  Mild right base subsegmental atelectasis. Electronically Signed   By: Marcello Moores  Register   On: 01/01/2018 13:53   Ct Head Wo Contrast  Result Date: 01/04/2018 CLINICAL DATA:  Altered level of consciousness. Urinary tract  infection. No reported injury. EXAM: CT HEAD WITHOUT CONTRAST TECHNIQUE: Contiguous axial images were obtained from the base of the skull through the vertex without intravenous contrast. COMPARISON:  09/24/2007 head CT FINDINGS: Brain: No evidence of parenchymal hemorrhage or extra-axial fluid collection. No mass lesion, mass effect, or midline shift. No CT evidence of acute infarction. Nonspecific moderate subcortical and periventricular white matter hypodensity, most in keeping with chronic small vessel ischemic change. Generalized cerebral volume loss. Cerebral ventricle sizes are stable and concordant with the degree of cerebral volume loss. Vascular: No acute abnormality. Skull: No evidence of calvarial fracture. Sinuses/Orbits: The visualized paranasal sinuses are essentially clear. Other: Small partial right mastoid effusion. Clear left mastoid air cells. IMPRESSION: 1.  No evidence of acute intracranial abnormality. 2. Generalized cerebral volume loss. Moderate chronic small vessel ischemic changes in the cerebral white matter. 3. Nonspecific small partial right mastoid effusion. Electronically Signed   By: Ilona Sorrel M.D.   On: 01/04/2018 16:48   Dg Chest Port 1 View  Result Date: 12/09/2017 CLINICAL DATA:  Right IJ central line catheter placement. EXAM: PORTABLE CHEST 1 VIEW COMPARISON:  CXR 06/20/2014 FINDINGS: Hyperinflated lungs without pneumothorax. Right port catheter tip from an IJ approach is noted with tip in the distal SVC. Heart is normal in size. There is moderate aortic atherosclerosis without aneurysm. High-riding humeral heads right worse than left compatible with chronic rotator cuff tears. Degenerative osteoarthritic joint space narrowing and spurring is seen about both shoulders. IMPRESSION: 1. New right IJ port catheter is noted without pneumothorax. Tip is seen in the distal SVC. 2. Hyperinflated lungs. 3. Aortic atherosclerosis. Electronically Signed   By: Ashley Royalty M.D.   On:  12/09/2017 15:18   Dg C-arm 1-60 Min-no Report  Result Date: 12/09/2017 Fluoroscopy was utilized by the requesting physician.  No radiographic interpretation.    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  PLAN:    1. 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  Being the sole abnormality, is usually associated with a better outcome.  Patient also noted increased blast count, but unclear how much.  She also was noted to have an increase in clonal plasma cells possibly indicating  an additional plasma cell dyscrasia.  Patient's performance status is declining and end-of-life hospice was discussed today, but patient is not ready to pursue this option.  Proceed with 1 unit of packed red blood cells.  Patient does not require Procrit today.   Return to clinic in 1 week with repeat laboratory work and further evaluation.   2.  History of breast cancer: No evidence of disease.  Patient's mammograms are ordered by her PCP. 3.  Hypertension: Blood pressure is within normal limits today. Continue treatment per PCP. 4.  Leukopenia: Secondary to underlying MDS. Bone marrow biopsy as above. 5.  Thrombocytopenia: Patient's platelet count is trending down.  Likely secondary to progressive MDS.  Monitor. 6.  Elevated iron stores: Patient will require Desferal with transfusions.  Approximately 30 minutes was spent in discussion of which greater than 50% was consultation.  Patient expressed understanding and was in agreement with this plan. She also understands that She can call clinic at any time with any questions, concerns, or complaints.    [name], MD 01/08/18 11:11 AM

## 2018-01-04 NOTE — ED Provider Notes (Signed)
[facility] Emergency Department Provider Note ____________________________________________   First MD Initiated Contact with Patient 01/04/18 1520     (approximate)  I have reviewed the triage vital signs and the nursing notes.   HISTORY  Chief Complaint Weakness    HPI Chelsea Horne is a 84 y.o. female with past medical history as noted below including myelodysplastic syndrome who presents with with persistent dizziness, described as being unsteady and wobbly on her feet, and associated with generalized weakness.  She is unable to describe whether it is more of a lightheadedness or feeling like she will pass out, versus spinning or vertigo.  The time course is approximately 1 week.  Per the daughter, the weakness has been associated with decreased p.o. Intake.  The patient was seen in the ED 3 days ago for the same symptoms, and was diagnosed with a UTI; per the daughter, the patient has been taking the antibiotic as prescribed.  Per the daughter, the patient has had increased difficulty getting out of her bed, going to the bathroom, or moving around.  The patient's daughter lives with the patient, however she is gone for approximately 12 hours a day at work, and the daughter is concerned that the patient is unable to adequately take care of herself during this time.   Past Medical History:  Diagnosis Date  . Breast cancer ([place])    mastectomy  . COPD (chronic obstructive pulmonary disease) ([place])   . Heart murmur   . Hiatal hernia   . Hypertension   . UTI (lower urinary tract infection)   . UTI (urinary tract infection)    frequent in past    Patient Active Problem List   Diagnosis Date Noted  . Complete tear of right rotator cuff 05/03/2016  . Rotator cuff arthropathy, right 05/03/2016  . Myelodysplastic syndrome ([place]) 03/13/2015    Past Surgical History:  Procedure Laterality Date  . ABDOMINAL HYSTERECTOMY    . BONE MARROW ASPIRATION   2018  . FOOT SURGERY     for hammertoe  . MASTECTOMY Left    modified radical in August 1996  . PORTACATH PLACEMENT N/A 12/09/2017   Procedure: INSERTION PORT-A-CATH;  Surgeon: Vickie Epley, MD;  Location: [facility] ORS;  Service: Vascular;  Laterality: N/A;    Prior to Admission medications   Medication Sig Start Date End Date Taking? Authorizing Provider  acetaminophen (RA ACETAMINOPHEN) 650 MG CR tablet Take 650 mg by mouth every 8 (eight) hours as needed.    Yes [provider]  alendronate (FOSAMAX) 70 MG tablet Take 70 mg by mouth once a week. [place] 04/01/15  Yes [provider]  Ascorbic Acid (VITAMIN C) 1000 MG tablet Take 1,000 mg by mouth daily.   Yes [provider]  aspirin 81 MG tablet Take 81 mg by mouth daily.   Yes [provider]  gabapentin (NEURONTIN) 100 MG capsule Take 1 capsule by mouth at bedtime. 02/10/16  Yes [provider]  Multiple Minerals (JOINT HEALTH MINERAL PO) Take 1 tablet by mouth daily.   Yes [provider]  Multiple Vitamin (MULTIVITAMIN) tablet Take 1 tablet by mouth daily.   Yes [provider]  psyllium (REGULOID) 0.52 g capsule Take 0.52 g by mouth daily. Metamucil   Yes [provider]  valsartan (DIOVAN) 40 MG tablet Take 40 mg by mouth daily.   Yes [provider]  calcium-vitamin D (OSCAL WITH D) 250-125 MG-UNIT per tablet Take 1 tablet by  mouth daily.    [provider]  cephALEXin (KEFLEX) 500 MG capsule Take 1 capsule (500 mg total) by mouth 3 (three) times daily for 10 days. Patient not taking: Reported on 01/04/2018 01/01/18 01/11/18  Orbie Pyo, MD    Allergies Nitrofurantoin; Penicillins; and Sulfa antibiotics  Family History  Problem Relation Age of Onset  . Cancer Brother        lung  . Hypertension Brother   . Anemia Brother   . Myelodysplastic syndrome Brother     Social History Social History   Tobacco Use  . Smoking  status: Current Some Day Smoker    Packs/day: 0.25    Types: Cigarettes  . Smokeless tobacco: Never Used  Substance Use Topics  . Alcohol use: No  . Drug use: No    Review of Systems  Constitutional: No fever/chills. Eyes: No visual changes. ENT: No sore throat. Cardiovascular: Denies chest pain. Respiratory: Denies shortness of breath. Gastrointestinal: No nausea, no vomiting.   Genitourinary: Negative for dysuria.  Musculoskeletal: Negative for back pain. Skin: Negative for rash. Neurological: Negative for headaches, positive for dizziness.   ____________________________________________   PHYSICAL EXAM:  VITAL SIGNS: ED Triage Vitals  Enc Vitals Group     BP 01/04/18 1451 (!) 143/50     Pulse Rate 01/04/18 1451 (!) 57     Resp 01/04/18 1451 16     Temp 01/04/18 1451 97.6 F (36.4 C)     Temp Source 01/04/18 1451 Oral     SpO2 01/04/18 1451 100 %     Weight 01/04/18 1453 115 lb (52.2 kg)     Height 01/04/18 1453 5\' 7"  (1.702 m)     Head Circumference --      Peak Flow --      Pain Score 01/04/18 1453 0     Pain Loc --      Pain Edu? --      Excl. in [place]? --     Constitutional: Alert and oriented.  Relatively comfortable appearing and in no acute distress. Eyes: Conjunctivae are normal.  EOMI.  PERRLA. Head: Atraumatic. Nose: No congestion/rhinnorhea. Mouth/Throat: Mucous membranes are somewhat dry.   Neck: Normal range of motion.  Cardiovascular: Normal rate, regular rhythm. Grossly normal heart sounds.  Good peripheral circulation. Respiratory: Normal respiratory effort.  No retractions. Lungs CTAB. Gastrointestinal: Soft and nontender. No distention.  Genitourinary: No CVA tenderness. Musculoskeletal: No lower extremity edema.  Extremities warm and well perfused.  Neurologic:  Normal speech and language. No gross focal neurologic deficits are appreciated.  Motor and sensory intact in all extremities.  Normal coordination on finger to nose, and no  ataxia. Skin:  Skin is warm and dry. No rash noted. Psychiatric: Mood and affect are normal. Speech and behavior are normal.  ____________________________________________   LABS (all labs ordered are listed, but only abnormal results are displayed)  Labs Reviewed  COMPREHENSIVE METABOLIC PANEL - Abnormal; Notable for the following components:      Result Value   Sodium 134 (*)    Calcium 8.8 (*)    Total Protein 6.2 (*)    Albumin 3.4 (*)    Total Bilirubin 1.3 (*)    All other components within normal limits  CBC WITH DIFFERENTIAL/PLATELET - Abnormal; Notable for the following components:   WBC 1.6 (*)    RBC 2.36 (*)    Hemoglobin 7.5 (*)    HCT 22.4 (*)    RDW 20.6 (*)    Platelets  70 (*)    Neutro Abs 0.4 (*)    Monocytes Absolute 0.1 (*)    All other components within normal limits  URINALYSIS, COMPLETE (UACMP) WITH MICROSCOPIC - Abnormal; Notable for the following components:   Color, Urine YELLOW (*)    APPearance HAZY (*)    Squamous Epithelial / LPF 0-5 (*)    All other components within normal limits  TROPONIN I   ____________________________________________  EKG   ____________________________________________  RADIOLOGY  CT head: No ICH or other acute findings ____________________________________________   PROCEDURES  Procedure(s) performed: No  Procedures  Critical Care performed: No ____________________________________________   INITIAL IMPRESSION / ASSESSMENT AND PLAN / ED COURSE  Pertinent labs & imaging results that were available during my care of the patient were reviewed by me and considered in my medical decision making (see chart for details).  84 year old female with history of mild dysplastic syndrome and other PMH as noted above presents with persistent dizziness and generalized weakness as well as decreased p.o. intake over the last week despite being on an antibiotic for an apparent UTI for the last 3 days.  I reviewed the past  medical records in Epic and confirmed that the patient was seen on 01/01/2018, diagnosed with UTI.  At that time apparently the patient was walking with gait that was at her baseline, and was safe to go home.  Per the daughter, the patient has been having home care including physical therapy and nursing visits, however her progress has somewhat plateaued and the nursing care is scheduled to and.  Differential for this generalized weakness and dizziness is relatively broad, but includes persistent UTI, other infection, dehydration, electrolyte abnormality, anemia, peripheral vertigo, or less likely other CNS cause. No evidence of cardiac etiology. Plan: Labs, repeat UA, CT head, IV hydration, and reassess.  ----------------------------------------- 8:49 PM on 01/04/2018 -----------------------------------------  Patient's lab workup is unremarkable; her hemoglobin is at 7.5, down from 7.93 days ago but this is not a significant change.  BMP is stable, and her UA is improved since her last visit.  CT head shows no acute findings.  Overall there is no indication for admission based on her workup.  When the patient sits up, she continues to feel subjectively dizzy although she is not orthostatic.  She was able to walk a few feet to the toilet, but did have some difficulty.  Neuro exam remains nonfocal.  I suggested trying meclizine for possible peripheral vertigo, and the patient and daughter agreed.  However, based on patient's persistent symptoms and difficulty moving around, the daughter would feel more comfortable with the patient staying overnight for possible physical therapy and social work evaluation in the morning, since she is concerned that when she goes to work the patient is alone and cannot safely take care of herself.  Therefore the patient will board in the ED until this evaluation is complete.  I will sign the patient out to the oncoming  physician.  ____________________________________________   FINAL CLINICAL IMPRESSION(S) / ED DIAGNOSES  Final diagnoses:  Dizziness      NEW MEDICATIONS STARTED DURING THIS VISIT:  New Prescriptions   No medications on file     Note:  This document was prepared using Dragon voice recognition software and may include unintentional dictation errors.    Arta Silence, MD 01/04/18 2103

## 2018-01-04 NOTE — ED Notes (Signed)
Pt given lunch tray and ginger ale. Sitting up in the bed, more perky than when she came in.

## 2018-01-04 NOTE — ED Triage Notes (Signed)
Pt seen this past week on the 14th and dx with dehydration and uti. Per family she lives at home by herself and is worried because she is still weak and has been having some memory problems. Has not seen her pcp about this. Is being evaluated by home care agency tomorrow.

## 2018-01-05 ENCOUNTER — Telehealth: Payer: Self-pay | Admitting: *Deleted

## 2018-01-05 DIAGNOSIS — R42 Dizziness and giddiness: Secondary | ICD-10-CM | POA: Diagnosis not present

## 2018-01-05 MED ORDER — CEPHALEXIN 500 MG PO CAPS
500.0000 mg | ORAL_CAPSULE | Freq: Three times a day (TID) | ORAL | Status: DC
  Administered 2018-01-05 (×2): 500 mg via ORAL
  Filled 2018-01-05 (×2): qty 1

## 2018-01-05 MED ORDER — SODIUM CHLORIDE 0.9% FLUSH
10.0000 mL | Freq: Four times a day (QID) | INTRAVENOUS | Status: DC
  Administered 2018-01-05: 10 mL via INTRAVENOUS

## 2018-01-05 MED ORDER — MECLIZINE HCL 25 MG PO TABS: 25.0000 mg | ORAL_TABLET | Freq: Three times a day (TID) | ORAL | 0 refills | Status: DC | PRN

## 2018-01-05 MED ORDER — MECLIZINE HCL 25 MG PO TABS
25.0000 mg | ORAL_TABLET | Freq: Once | ORAL | Status: AC
  Administered 2018-01-05: 25 mg via ORAL
  Filled 2018-01-05: qty 1

## 2018-01-05 MED ORDER — HEPARIN SOD (PORK) LOCK FLUSH 100 UNIT/ML IV SOLN
500.0000 [IU] | Freq: Once | INTRAVENOUS | Status: AC
  Administered 2018-01-05: 500 [IU] via INTRAVENOUS
  Filled 2018-01-05: qty 5

## 2018-01-05 NOTE — ED Notes (Signed)
Pt assisted to bedside commode. Pt reports dizziness with standing, pt ambulates well to bedside commode.

## 2018-01-05 NOTE — Telephone Encounter (Signed)
They got prescription for hospital bed, but they need a form filled out and office notes from Dr Grayland Ormond . She asks you call her back regarding this

## 2018-01-05 NOTE — ED Notes (Signed)
Pt continues to sleep. Respirations unlabored.  

## 2018-01-05 NOTE — ED Notes (Addendum)
Social work at bedside.  

## 2018-01-05 NOTE — ED Provider Notes (Signed)
-----------------------------------------   7:18 AM on 01/05/2018 -----------------------------------------   Blood pressure (!) 141/90, pulse 62, temperature 98.4 F (36.9 C), temperature source Oral, resp. rate 16, height 1.702 m (5\' 7" ), weight 52.2 kg (115 lb), SpO2 96 %.  The patient had no acute events since last update.  Calm and cooperative at this time.  Disposition is pending PT/OT, social work, and case management evaluation and recommendations.   Hinda Kehr, MD 01/05/18 [phone]

## 2018-01-05 NOTE — Progress Notes (Signed)
PT Cancellation Note  Patient Details Name: Chelsea Horne MRN: [id number] DOB: 1927-01-07   Cancelled Treatment:    Reason Eval/Treat Not Completed: Other (comment)(PT order discontinued by care team.  Patient planning to return home with resumption of [place] services at this time.  Please re-consult should needs change.)   Floria Brandau H. Owens Shark, PT, DPT, NCS 01/05/18, 1:23 PM [phone]

## 2018-01-05 NOTE — Telephone Encounter (Signed)
Dr Grayland Ormond called ER

## 2018-01-05 NOTE — ED Notes (Signed)
Spoke with pt daughter and explained we are waiting on CSW and PT consult.Marland Kitchen

## 2018-01-05 NOTE — ED Notes (Signed)
Spoke with md regarding pt's accessed port, order for ns flushes received. Spoke with david in pharmacy regarding retiming pt's keflex, is due every 8 hours, last given at 0237.

## 2018-01-05 NOTE — Clinical Social Work Note (Signed)
CSW consulted for "placement." CSW met with patient's family at bedside. Patient sleeping soundly. Patient's daughter-Carolyn Ecuador and neighbor Hassan Rowan at bedside. CSW introduced self and discussed discharge plan. Daughter states she does not want to send patient to a [facility] (SNF) or [facility] (ALF) at this time. Daughter states she lives with patient, but  works in [place] from [place] and doesn't get home until around [place]. Patient already has home health set up with [facility] and they will come out to the home on Thursday 01/08/18.  CSW and daughter discussed PACE/day programs, Community Alternatives Program (CAP) and the [employer] program through [employer] Daughter stated patient would not participate in PACE or a day program as she sleeps most of the day and does not qualify for community Medicaid due to income and assets. Daughter stated she has a few names of personal care aides that she will call for services. CSW provided [facility]. Daughter asked about patient remaining here in the ED for 3 days to qualify for Medicare to pay for short-term rehab. CSW explained for Medicare to cover a short term rehab stay, patient would have to meet criteria to be admitted inpatient. CSW explained that 3 nights in the ED are not qualifying stays. Daughter expressed understanding. CSW also discussed with daughter the process of placing patient in an ALF or SNF and explained the importance of being proactive in this process. CSW also provided resources for the placement process. Daughter asked about getting patient a hospital bed. Per discussion with [facility] Malachy Mood, Dr. Alfred Horne would need to provide a Rx for the hospital bed. CSW updated Dr. Alfred Horne. CSW signing off as no further Social Work needs identified.   Chelsea Horne, Latanya Presser, [place] Social Worker-ED [phone]

## 2018-01-05 NOTE — Telephone Encounter (Signed)
If we have a chair, she can come here.

## 2018-01-05 NOTE — ED Notes (Signed)
Report from rebecca, rn.  

## 2018-01-05 NOTE — ED Provider Notes (Signed)
Patient evaluated by PT and SW. Does not meet criteria for placement out of ED. Has home health care. Prescription provided for hospital bed at home. Spoke with patient's oncologist, Dr. Grayland Ormond about patient's hgb. Patient's labs are within her baseline. Dr. Grayland Ormond has appointment for her this week for a transfusion and does not think patient needs admission for that. I spoke at length with patient and her daughter and offered admission for transfusion vs outpatient with Dr. Grayland Ormond and they opted for outpatient. Patient has been doing much better after started on meclizine. Ate her entire breakfast tray this morning which according to the daughter is more than she had eaten all last week. UA seems to be clearing with current abx, recommended finishing the entire course at home. Family requesting a prescription for meclizine for home which will be provided. Discussed using walker at home to avoid falls, close f/u with oncology and strict return precautions. Patient is now stable for dc home.    Alfred Levins, [place], MD 01/05/18 [phone]

## 2018-01-05 NOTE — Telephone Encounter (Signed)
We have no chairs today, can you ask ER doctor to order since she is there?

## 2018-01-05 NOTE — Progress Notes (Signed)
[facility]  Patient Status:Active  [facility] is providing the following services: SN/PT/HHA  If patient discharges after hours, please call [phone].   Chelsea Horne 01/05/2018, 10:21 AM

## 2018-01-05 NOTE — ED Notes (Signed)
Pt alert and oriented X4, active, cooperative, pt in NAD. RR even and unlabored, color WNL.  Pt informed to return if any life threatening symptoms occur.  Discharge and followup instructions reviewed. Left with daughter and neighbor.  Pt ate all of her breakfast, had BM, urinated, ambulated well with stand by assist. PT family in touch [facility] prior to discharge to have hospital bed delivered. Pt has walker and cane at home. Encouraged to drink, rest and use assistive items when needed.  Pt will continue to take oral antibiotics at home.

## 2018-01-05 NOTE — ED Notes (Signed)
Spoke with charge nurse at [facility] who states to hep lock the port when de accessing. Pt has appt with cancer center Thursday. Pt did not receive any blood in ER.

## 2018-01-05 NOTE — ED Notes (Signed)
Pt and daughter sleeping.

## 2018-01-05 NOTE — ED Notes (Signed)
Pt assisted up to void on commode. Port flushed with 7mL of ns.

## 2018-01-05 NOTE — ED Notes (Signed)
Pt unsure if port is hep locked or not, called cancer center to verify. Had to leave message.

## 2018-01-05 NOTE — Telephone Encounter (Signed)
Patient in Er since yesterday with dizziness and her HGB is 7.5. Daughter asking if patient should get transfusion either while in ER or come to office to get it. Asking Dr Grayland Ormond order it. Please advise

## 2018-01-06 DIAGNOSIS — I10 Essential (primary) hypertension: Secondary | ICD-10-CM | POA: Diagnosis not present

## 2018-01-06 DIAGNOSIS — J449 Chronic obstructive pulmonary disease, unspecified: Secondary | ICD-10-CM | POA: Diagnosis not present

## 2018-01-06 DIAGNOSIS — D469 Myelodysplastic syndrome, unspecified: Secondary | ICD-10-CM | POA: Diagnosis not present

## 2018-01-06 DIAGNOSIS — Z853 Personal history of malignant neoplasm of breast: Secondary | ICD-10-CM | POA: Diagnosis not present

## 2018-01-06 DIAGNOSIS — K449 Diaphragmatic hernia without obstruction or gangrene: Secondary | ICD-10-CM | POA: Diagnosis not present

## 2018-01-06 DIAGNOSIS — F17210 Nicotine dependence, cigarettes, uncomplicated: Secondary | ICD-10-CM | POA: Diagnosis not present

## 2018-01-06 NOTE — Telephone Encounter (Signed)
I have faxed over form, they will need most recent office update to include specific details about need for hospital bed. I will give narrative to Dr. Grayland Ormond once it is received.

## 2018-01-08 ENCOUNTER — Inpatient Hospital Stay (HOSPITAL_BASED_OUTPATIENT_CLINIC_OR_DEPARTMENT_OTHER): Payer: Medicare Other | Admitting: Oncology

## 2018-01-08 ENCOUNTER — Other Ambulatory Visit: Payer: Self-pay

## 2018-01-08 ENCOUNTER — Inpatient Hospital Stay: Payer: Medicare Other

## 2018-01-08 VITALS — BP 155/73 | HR 67 | Temp 94.8°F | Resp 18 | Wt 109.5 lb

## 2018-01-08 DIAGNOSIS — D696 Thrombocytopenia, unspecified: Secondary | ICD-10-CM

## 2018-01-08 DIAGNOSIS — Z801 Family history of malignant neoplasm of trachea, bronchus and lung: Secondary | ICD-10-CM

## 2018-01-08 DIAGNOSIS — D469 Myelodysplastic syndrome, unspecified: Secondary | ICD-10-CM

## 2018-01-08 DIAGNOSIS — Z87440 Personal history of urinary (tract) infections: Secondary | ICD-10-CM

## 2018-01-08 DIAGNOSIS — K449 Diaphragmatic hernia without obstruction or gangrene: Secondary | ICD-10-CM | POA: Diagnosis not present

## 2018-01-08 DIAGNOSIS — R634 Abnormal weight loss: Secondary | ICD-10-CM

## 2018-01-08 DIAGNOSIS — Z79899 Other long term (current) drug therapy: Secondary | ICD-10-CM

## 2018-01-08 DIAGNOSIS — I119 Hypertensive heart disease without heart failure: Secondary | ICD-10-CM | POA: Diagnosis not present

## 2018-01-08 DIAGNOSIS — R630 Anorexia: Secondary | ICD-10-CM | POA: Diagnosis not present

## 2018-01-08 DIAGNOSIS — Z853 Personal history of malignant neoplasm of breast: Secondary | ICD-10-CM | POA: Diagnosis not present

## 2018-01-08 DIAGNOSIS — J9811 Atelectasis: Secondary | ICD-10-CM

## 2018-01-08 DIAGNOSIS — J449 Chronic obstructive pulmonary disease, unspecified: Secondary | ICD-10-CM

## 2018-01-08 DIAGNOSIS — F17210 Nicotine dependence, cigarettes, uncomplicated: Secondary | ICD-10-CM | POA: Diagnosis not present

## 2018-01-08 DIAGNOSIS — I10 Essential (primary) hypertension: Secondary | ICD-10-CM | POA: Diagnosis not present

## 2018-01-08 DIAGNOSIS — I700 Atherosclerosis of aorta: Secondary | ICD-10-CM

## 2018-01-08 DIAGNOSIS — R011 Cardiac murmur, unspecified: Secondary | ICD-10-CM | POA: Diagnosis not present

## 2018-01-08 DIAGNOSIS — Z7982 Long term (current) use of aspirin: Secondary | ICD-10-CM

## 2018-01-08 DIAGNOSIS — G939 Disorder of brain, unspecified: Secondary | ICD-10-CM

## 2018-01-08 DIAGNOSIS — Z9012 Acquired absence of left breast and nipple: Secondary | ICD-10-CM

## 2018-01-08 DIAGNOSIS — D464 Refractory anemia, unspecified: Secondary | ICD-10-CM

## 2018-01-08 LAB — CBC WITH DIFFERENTIAL/PLATELET
BASOS ABS: 0 10*3/uL (ref 0–0.1)
Basophils Relative: 1 %
Eosinophils Absolute: 0 10*3/uL (ref 0–0.7)
Eosinophils Relative: 0 %
HEMATOCRIT: 22.2 % — AB (ref 35.0–47.0)
HEMOGLOBIN: 7.6 g/dL — AB (ref 12.0–16.0)
LYMPHS PCT: 66 %
Lymphs Abs: 1.2 10*3/uL (ref 1.0–3.6)
MCH: 33.1 pg (ref 26.0–34.0)
MCHC: 34.2 g/dL (ref 32.0–36.0)
MCV: 97 fL (ref 80.0–100.0)
MONO ABS: 0.1 10*3/uL — AB (ref 0.2–0.9)
Monocytes Relative: 4 %
NEUTROS ABS: 0.5 10*3/uL — AB (ref 1.4–6.5)
NEUTROS PCT: 29 %
Platelets: 75 10*3/uL — ABNORMAL LOW (ref 150–440)
RBC: 2.29 MIL/uL — ABNORMAL LOW (ref 3.80–5.20)
RDW: 21.2 % — AB (ref 11.5–14.5)
WBC: 1.8 10*3/uL — ABNORMAL LOW (ref 3.6–11.0)

## 2018-01-08 LAB — SAMPLE TO BLOOD BANK

## 2018-01-08 LAB — PREPARE RBC (CROSSMATCH)

## 2018-01-08 MED ORDER — MEGESTROL ACETATE 40 MG PO TABS: 40.0000 mg | ORAL_TABLET | Freq: Every day | ORAL | 2 refills | Status: DC

## 2018-01-08 MED ORDER — SODIUM CHLORIDE 0.9 % IV SOLN
250.0000 mL | Freq: Once | INTRAVENOUS | Status: AC
  Administered 2018-01-08: 250 mL via INTRAVENOUS
  Filled 2018-01-08: qty 250

## 2018-01-08 MED ORDER — DIPHENHYDRAMINE HCL 50 MG/ML IJ SOLN
25.0000 mg | Freq: Once | INTRAMUSCULAR | Status: AC
  Administered 2018-01-08: 25 mg via INTRAVENOUS
  Filled 2018-01-08: qty 1

## 2018-01-08 MED ORDER — ACETAMINOPHEN 325 MG PO TABS
650.0000 mg | ORAL_TABLET | Freq: Once | ORAL | Status: AC
  Administered 2018-01-08: 650 mg via ORAL
  Filled 2018-01-08: qty 2

## 2018-01-08 MED ORDER — HEPARIN SOD (PORK) LOCK FLUSH 100 UNIT/ML IV SOLN
500.0000 [IU] | Freq: Every day | INTRAVENOUS | Status: AC | PRN
  Administered 2018-01-08: 500 [IU]
  Filled 2018-01-08: qty 5

## 2018-01-08 NOTE — Progress Notes (Signed)
Pt per daughter has been having episodes of " dizziness w any movt -standing or sitting up in bed. This has been going on for approx 2 weeks. Too dizzy to come her last week- started meclizine Monday 2/18 /19 -w minimal improvement.  Very diff to have pt even eat. Noted that there has been bright red blood on tissue after BM the other day -but also noted x 1-2 d. Per daughter

## 2018-01-09 ENCOUNTER — Telehealth: Payer: Self-pay | Admitting: *Deleted

## 2018-01-09 ENCOUNTER — Other Ambulatory Visit: Payer: Self-pay | Admitting: *Deleted

## 2018-01-09 DIAGNOSIS — D469 Myelodysplastic syndrome, unspecified: Secondary | ICD-10-CM | POA: Diagnosis not present

## 2018-01-09 DIAGNOSIS — K449 Diaphragmatic hernia without obstruction or gangrene: Secondary | ICD-10-CM | POA: Diagnosis not present

## 2018-01-09 DIAGNOSIS — Z853 Personal history of malignant neoplasm of breast: Secondary | ICD-10-CM | POA: Diagnosis not present

## 2018-01-09 DIAGNOSIS — F17210 Nicotine dependence, cigarettes, uncomplicated: Secondary | ICD-10-CM | POA: Diagnosis not present

## 2018-01-09 DIAGNOSIS — I10 Essential (primary) hypertension: Secondary | ICD-10-CM | POA: Diagnosis not present

## 2018-01-09 DIAGNOSIS — J449 Chronic obstructive pulmonary disease, unspecified: Secondary | ICD-10-CM | POA: Diagnosis not present

## 2018-01-09 LAB — TYPE AND SCREEN
ABO/RH(D): O POS
Antibody Screen: POSITIVE
Unit division: 0

## 2018-01-09 LAB — BPAM RBC
BLOOD PRODUCT EXPIRATION DATE: 201903012359
ISSUE DATE / TIME: 201902211333
UNIT TYPE AND RH: 5100

## 2018-01-09 NOTE — Telephone Encounter (Signed)
[place].  She will have to go to the ED if she remains symptomatic.

## 2018-01-09 NOTE — Telephone Encounter (Signed)
Wanted to advise of orthostatic b/p today 14/58 sitting, 105/58 standing. Stats she wanted to let us know since she has been having the dizziness

## 2018-01-11 NOTE — Progress Notes (Signed)
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  INTERVAL HISTORY: Patient returns to clinic today for further evaluation and consideration of additional blood or Procrit.  Her performance status continues to decline, the patient states her weakness and fatigue has mildly improved this week.  She continues to have a poor appetite and is losing weight. She has no neurologic complaints. She denies any recent fevers, chills, night sweats, or weight loss.  She denies any chest pain or shortness of breath. She has no nausea, vomiting, constipation, or diarrhea. She has no melena or hematochezia. She has no urinary complaints.  Patient offers no further specific complaints today.  REVIEW OF SYSTEMS:   Review of Systems  Constitutional: Positive for malaise/fatigue and weight loss. Negative for fever.  Eyes: Negative for blurred vision, double vision and pain.  Respiratory: Negative.  Negative for cough and shortness of breath.   Cardiovascular: Negative.  Negative for chest pain and leg swelling.  Gastrointestinal: Negative.  Negative for abdominal pain, blood in stool, constipation, diarrhea, melena, nausea and vomiting.  Genitourinary: Negative.   Musculoskeletal: Negative.  Negative for falls and joint pain.  Skin: Negative.  Negative for rash.  Neurological: Positive for weakness. Negative for dizziness and sensory change.  Psychiatric/Behavioral: Positive for memory loss. The patient is not nervous/anxious and does not have insomnia.     As per HPI. Otherwise, a complete review of systems is negative.  PAST MEDICAL HISTORY: Past Medical History:  Diagnosis Date  . Breast cancer ([place])    mastectomy  . COPD (chronic obstructive pulmonary disease) ([place])   . Heart murmur   . Hiatal hernia   . Hypertension   . UTI (lower urinary tract infection)   . UTI (urinary tract infection)    frequent in past    PAST SURGICAL HISTORY: Past Surgical History:  Procedure Laterality Date  . ABDOMINAL HYSTERECTOMY    . BONE MARROW ASPIRATION  2018  . FOOT SURGERY     for hammertoe  . MASTECTOMY Left    modified radical in August 1996  . PORTACATH PLACEMENT N/A 12/09/2017   Procedure: INSERTION PORT-A-CATH;  Surgeon: Vickie Epley, MD;  Location: [facility] ORS;  Service: Vascular;  Laterality: N/A;    FAMILY HISTORY Family History  Problem Relation Age of Onset  . Cancer Brother        lung  . Hypertension Brother   . Anemia Brother   . Myelodysplastic syndrome Brother        ADVANCED DIRECTIVES:    HEALTH MAINTENANCE: Social History   Tobacco Use  . Smoking status: Current Some Day Smoker    Packs/day: 0.25    Types: Cigarettes  . Smokeless tobacco: Never Used  Substance Use Topics  . Alcohol use: No  . Drug use: No     Colonoscopy:  PAP:  Bone density:  Lipid panel:  Allergies  Allergen Reactions  . Nitrofurantoin Other (See Comments)  . Penicillins Itching    Has patient had a PCN reaction causing immediate rash, facial/tongue/throat swelling, SOB or lightheadedness with hypotension: Yes Has patient had a PCN reaction causing severe rash involving mucus membranes or skin necrosis: Unknown Has patient had a PCN reaction that required hospitalization: Unknown Has patient had  a PCN reaction occurring within the last 10 years: Yes If all of the above answers are "NO", then may proceed with Cephalosporin use.  . Sulfa Antibiotics Itching    Current Outpatient Medications  Medication Sig Dispense Refill  . acetaminophen (RA ACETAMINOPHEN) 650 MG CR tablet Take 650 mg by mouth every 8 (eight) hours as needed.     Marland Kitchen alendronate (FOSAMAX) 70 MG tablet Take 70  mg by mouth once a week. Saturaday  0  . Ascorbic Acid (VITAMIN C) 1000 MG tablet Take 1,000 mg by mouth daily.    Marland Kitchen aspirin 81 MG tablet Take 81 mg by mouth daily.    . calcium-vitamin D (OSCAL WITH D) 250-125 MG-UNIT per tablet Take 1 tablet by mouth daily.    Marland Kitchen gabapentin (NEURONTIN) 100 MG capsule Take 1 capsule by mouth at bedtime.    . meclizine (ANTIVERT) 25 MG tablet Take 1 tablet (25 mg total) by mouth 3 (three) times daily as needed for dizziness. 30 tablet 0  . megestrol (MEGACE) 40 MG tablet Take 1 tablet (40 mg total) by mouth daily. 30 tablet 2  . Multiple Minerals (JOINT HEALTH MINERAL PO) Take 1 tablet by mouth daily.    . Multiple Vitamin (MULTIVITAMIN) tablet Take 1 tablet by mouth daily.    . psyllium (REGULOID) 0.52 g capsule Take 0.52 g by mouth daily. Metamucil    . valsartan-hydrochlorothiazide (DIOVAN-HCT) 80-12.5 MG tablet      No current facility-administered medications for this visit.     OBJECTIVE: Vitals:   01/15/18 0931  BP: 123/68  Pulse: 64  Resp: 18  Temp: 97.6 F (36.4 C)     Body mass index is 17.63 kg/m.    ECOG FS:3 - Symptomatic, >50% confined to bed  General: Thin, no acute distress.  Sitting in a wheelchair Eyes: Pink conjunctiva, anicteric sclera. Lungs: Clear to auscultation bilaterally. Heart: Regular rate and rhythm. No rubs, murmurs, or gallops. Abdomen: Soft, nontender, nondistended. No organomegaly noted, normoactive bowel sounds. Musculoskeletal: No edema, cyanosis, or clubbing. Neuro: Alert, answering all questions appropriately. Cranial nerves grossly intact. Skin: No rashes or petechiae noted. Psych: Normal affect.  LAB RESULTS:  Lab Results  Component Value Date   NA 134 (L) 01/04/2018   K 3.7 01/04/2018   CL 104 01/04/2018   CO2 24 01/04/2018   GLUCOSE 94 01/04/2018   BUN 19 01/04/2018   CREATININE 0.72 01/04/2018   CALCIUM 8.8 (L) 01/04/2018   PROT 6.2 (L) 01/04/2018   ALBUMIN 3.4 (L) 01/04/2018   AST 25  01/04/2018   ALT 14 01/04/2018   ALKPHOS 45 01/04/2018   BILITOT 1.3 (H) 01/04/2018   GFRNONAA >60 01/04/2018   GFRAA >60 01/04/2018    Lab Results  Component Value Date   WBC 1.6 (L) 01/15/2018   NEUTROABS 0.4 (L) 01/15/2018   HGB 7.4 (L) 01/15/2018   HCT 21.1 (L) 01/15/2018   MCV 94.1 01/15/2018   PLT 65 (L) 01/15/2018   Lab Results  Component Value Date   FERRITIN 595 (H) 10/01/2017   Lab Results  Component Value Date   IRON 214 (H) 10/01/2017   TIBC 256 10/01/2017   IRONPCTSAT 83 (H) 10/01/2017      STUDIES: Dg Chest 2 View  Result Date: 01/01/2018 CLINICAL DATA:  Increase weakness.  Anemia. EXAM: CHEST  2 VIEW COMPARISON:  12/09/2017. FINDINGS: PowerPort catheter noted with tip over the. Superior vena cava. Lungs are clear. Cardiomegaly with normal pulmonary vascularity. Mild right base  subsegmental atelectasis. No pleural effusion or pneumothorax. IMPRESSION: PowerPort catheter noted with tip over the superior vena cava. 2.  Cardiomegaly with normal pulmonary vascularity. 3.  Mild right base subsegmental atelectasis. Electronically Signed   By: Marcello Moores  Register   On: 01/01/2018 13:53   Ct Head Wo Contrast  Result Date: 01/04/2018 CLINICAL DATA:  Altered level of consciousness. Urinary tract infection. No reported injury. EXAM: CT HEAD WITHOUT CONTRAST TECHNIQUE: Contiguous axial images were obtained from the base of the skull through the vertex without intravenous contrast. COMPARISON:  09/24/2007 head CT FINDINGS: Brain: No evidence of parenchymal hemorrhage or extra-axial fluid collection. No mass lesion, mass effect, or midline shift. No CT evidence of acute infarction. Nonspecific moderate subcortical and periventricular white matter hypodensity, most in keeping with chronic small vessel ischemic change. Generalized cerebral volume loss. Cerebral ventricle sizes are stable and concordant with the degree of cerebral volume loss. Vascular: No acute abnormality. Skull: No  evidence of calvarial fracture. Sinuses/Orbits: The visualized paranasal sinuses are essentially clear. Other: Small partial right mastoid effusion. Clear left mastoid air cells. IMPRESSION: 1.  No evidence of acute intracranial abnormality. 2. Generalized cerebral volume loss. Moderate chronic small vessel ischemic changes in the cerebral white matter. 3. Nonspecific small partial right mastoid effusion. Electronically Signed   By: Ilona Sorrel M.D.   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  PLAN:    1. 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  Being the sole abnormality, is usually associated with a better outcome.  Patient also noted increased blast count, but unclear how much.  She also was noted to have an increase in clonal plasma cells possibly indicating an additional plasma cell dyscrasia.  Patient's performance status is declining and end-of-life hospice was previously discussed, but patient is not ready to pursue this option.  Plan was to proceed with 1 unit of packed red blood cells, but patient has developed an antibody making her blood harder to match.  Proceed with Desferal and Procrit today.  Patient will return to clinic on Tuesday, January 21, 2018 to received her matched blood.  She will then return 1 week later for further evaluation.   2.  History of breast cancer: No evidence of disease.  Patient's mammograms are ordered by her PCP. 3.  Hypertension: Blood pressure is within normal limits today. Continue treatment per PCP. 4.  Leukopenia/neutropenia: Secondary to underlying MDS. Bone marrow biopsy as above. 5.  Thrombocytopenia: Patient's platelet count is trending down.  Likely secondary to progressive MDS.  Monitor. 6.  Elevated iron stores: Patient will require Desferal with transfusions.  Approximately  30 minutes was spent in discussion of which greater than 50% was consultation.  Patient expressed understanding and was in agreement with this plan. She also understands that She can call clinic at any time with any questions, concerns, or complaints.    [name], MD 01/17/18 7:31 AM

## 2018-01-12 DIAGNOSIS — K449 Diaphragmatic hernia without obstruction or gangrene: Secondary | ICD-10-CM | POA: Diagnosis not present

## 2018-01-12 DIAGNOSIS — I10 Essential (primary) hypertension: Secondary | ICD-10-CM | POA: Diagnosis not present

## 2018-01-12 DIAGNOSIS — D469 Myelodysplastic syndrome, unspecified: Secondary | ICD-10-CM | POA: Diagnosis not present

## 2018-01-12 DIAGNOSIS — J449 Chronic obstructive pulmonary disease, unspecified: Secondary | ICD-10-CM | POA: Diagnosis not present

## 2018-01-12 DIAGNOSIS — F17210 Nicotine dependence, cigarettes, uncomplicated: Secondary | ICD-10-CM | POA: Diagnosis not present

## 2018-01-12 DIAGNOSIS — Z853 Personal history of malignant neoplasm of breast: Secondary | ICD-10-CM | POA: Diagnosis not present

## 2018-01-13 DIAGNOSIS — D469 Myelodysplastic syndrome, unspecified: Secondary | ICD-10-CM | POA: Diagnosis not present

## 2018-01-13 DIAGNOSIS — F17210 Nicotine dependence, cigarettes, uncomplicated: Secondary | ICD-10-CM | POA: Diagnosis not present

## 2018-01-13 DIAGNOSIS — J449 Chronic obstructive pulmonary disease, unspecified: Secondary | ICD-10-CM | POA: Diagnosis not present

## 2018-01-13 DIAGNOSIS — I10 Essential (primary) hypertension: Secondary | ICD-10-CM | POA: Diagnosis not present

## 2018-01-13 DIAGNOSIS — Z853 Personal history of malignant neoplasm of breast: Secondary | ICD-10-CM | POA: Diagnosis not present

## 2018-01-13 DIAGNOSIS — K449 Diaphragmatic hernia without obstruction or gangrene: Secondary | ICD-10-CM | POA: Diagnosis not present

## 2018-01-15 ENCOUNTER — Inpatient Hospital Stay: Payer: Medicare Other

## 2018-01-15 ENCOUNTER — Inpatient Hospital Stay (HOSPITAL_BASED_OUTPATIENT_CLINIC_OR_DEPARTMENT_OTHER): Payer: Medicare Other | Admitting: Oncology

## 2018-01-15 ENCOUNTER — Encounter: Payer: Self-pay | Admitting: *Deleted

## 2018-01-15 VITALS — BP 123/68 | HR 64 | Temp 97.6°F | Resp 18 | Wt 112.6 lb

## 2018-01-15 DIAGNOSIS — Z9012 Acquired absence of left breast and nipple: Secondary | ICD-10-CM

## 2018-01-15 DIAGNOSIS — K449 Diaphragmatic hernia without obstruction or gangrene: Secondary | ICD-10-CM

## 2018-01-15 DIAGNOSIS — R634 Abnormal weight loss: Secondary | ICD-10-CM | POA: Diagnosis not present

## 2018-01-15 DIAGNOSIS — D469 Myelodysplastic syndrome, unspecified: Secondary | ICD-10-CM | POA: Diagnosis not present

## 2018-01-15 DIAGNOSIS — J9811 Atelectasis: Secondary | ICD-10-CM

## 2018-01-15 DIAGNOSIS — Z801 Family history of malignant neoplasm of trachea, bronchus and lung: Secondary | ICD-10-CM

## 2018-01-15 DIAGNOSIS — D638 Anemia in other chronic diseases classified elsewhere: Secondary | ICD-10-CM

## 2018-01-15 DIAGNOSIS — Z853 Personal history of malignant neoplasm of breast: Secondary | ICD-10-CM | POA: Diagnosis not present

## 2018-01-15 DIAGNOSIS — F17210 Nicotine dependence, cigarettes, uncomplicated: Secondary | ICD-10-CM

## 2018-01-15 DIAGNOSIS — I119 Hypertensive heart disease without heart failure: Secondary | ICD-10-CM

## 2018-01-15 DIAGNOSIS — Z87440 Personal history of urinary (tract) infections: Secondary | ICD-10-CM

## 2018-01-15 DIAGNOSIS — D464 Refractory anemia, unspecified: Secondary | ICD-10-CM

## 2018-01-15 DIAGNOSIS — D696 Thrombocytopenia, unspecified: Secondary | ICD-10-CM | POA: Diagnosis not present

## 2018-01-15 DIAGNOSIS — R001 Bradycardia, unspecified: Secondary | ICD-10-CM | POA: Diagnosis not present

## 2018-01-15 DIAGNOSIS — I700 Atherosclerosis of aorta: Secondary | ICD-10-CM

## 2018-01-15 DIAGNOSIS — R630 Anorexia: Secondary | ICD-10-CM

## 2018-01-15 DIAGNOSIS — G939 Disorder of brain, unspecified: Secondary | ICD-10-CM

## 2018-01-15 DIAGNOSIS — Z79899 Other long term (current) drug therapy: Secondary | ICD-10-CM | POA: Diagnosis not present

## 2018-01-15 DIAGNOSIS — J449 Chronic obstructive pulmonary disease, unspecified: Secondary | ICD-10-CM | POA: Diagnosis not present

## 2018-01-15 DIAGNOSIS — Z7982 Long term (current) use of aspirin: Secondary | ICD-10-CM

## 2018-01-15 LAB — CBC WITH DIFFERENTIAL/PLATELET
BASOS ABS: 0 10*3/uL (ref 0–0.1)
Basophils Relative: 1 %
EOS PCT: 0 %
Eosinophils Absolute: 0 10*3/uL (ref 0–0.7)
HCT: 21.1 % — ABNORMAL LOW (ref 35.0–47.0)
Hemoglobin: 7.4 g/dL — ABNORMAL LOW (ref 12.0–16.0)
Lymphocytes Relative: 69 %
Lymphs Abs: 1.1 10*3/uL (ref 1.0–3.6)
MCH: 33.1 pg (ref 26.0–34.0)
MCHC: 35.2 g/dL (ref 32.0–36.0)
MCV: 94.1 fL (ref 80.0–100.0)
MONO ABS: 0.1 10*3/uL — AB (ref 0.2–0.9)
MONOS PCT: 6 %
Neutro Abs: 0.4 10*3/uL — ABNORMAL LOW (ref 1.4–6.5)
Neutrophils Relative %: 24 %
PLATELETS: 65 10*3/uL — AB (ref 150–440)
RBC: 2.24 MIL/uL — ABNORMAL LOW (ref 3.80–5.20)
RDW: 18.2 % — AB (ref 11.5–14.5)
WBC: 1.6 10*3/uL — ABNORMAL LOW (ref 3.6–11.0)

## 2018-01-15 LAB — PREPARE RBC (CROSSMATCH)

## 2018-01-15 LAB — SAMPLE TO BLOOD BANK

## 2018-01-15 MED ORDER — EPOETIN ALFA 40000 UNIT/ML IJ SOLN
40000.0000 [IU] | Freq: Once | INTRAMUSCULAR | Status: AC
  Administered 2018-01-15: 40000 [IU] via SUBCUTANEOUS
  Filled 2018-01-15: qty 1

## 2018-01-15 MED ORDER — HEPARIN SOD (PORK) LOCK FLUSH 100 UNIT/ML IV SOLN
500.0000 [IU] | Freq: Once | INTRAVENOUS | Status: AC
  Administered 2018-01-15: 500 [IU] via INTRAVENOUS
  Filled 2018-01-15: qty 5

## 2018-01-15 MED ORDER — ACETAMINOPHEN 325 MG PO TABS
650.0000 mg | ORAL_TABLET | Freq: Once | ORAL | Status: DC
  Filled 2018-01-15: qty 2

## 2018-01-15 MED ORDER — SODIUM CHLORIDE 0.9 % IV SOLN
15.0000 mg/kg/h | Freq: Once | INTRAVENOUS | Status: AC
  Administered 2018-01-15: 15 mg/kg/h via INTRAVENOUS
  Filled 2018-01-15: qty 2

## 2018-01-15 MED ORDER — DIPHENHYDRAMINE HCL 50 MG/ML IJ SOLN
25.0000 mg | Freq: Once | INTRAMUSCULAR | Status: DC
  Filled 2018-01-15: qty 1

## 2018-01-15 MED ORDER — SODIUM CHLORIDE 0.9% FLUSH
10.0000 mL | INTRAVENOUS | Status: DC | PRN
  Administered 2018-01-15: 10 mL via INTRAVENOUS
  Filled 2018-01-15: qty 10

## 2018-01-15 MED ORDER — SODIUM CHLORIDE 0.9 % IV SOLN
250.0000 mL | Freq: Once | INTRAVENOUS | Status: AC
  Administered 2018-01-15: 250 mL via INTRAVENOUS
  Filled 2018-01-15: qty 250

## 2018-01-15 NOTE — Progress Notes (Signed)
Per Tillie Rung RN per MD, pt to receive Desferal, one unit PRBCs and Procrit.  Unable to receive PRBCs from blood bank d/t antibodies per blood bank. Pt to be rescheduled once blood is available. Pt and Patient's daughter aware and verbalizes understanding. Tillie Rung RN aware and to discuss plan with Dr. Grayland Ormond.

## 2018-01-15 NOTE — Progress Notes (Signed)
Pt in today for follow up. Reports doing well today.  Have had difficulties with dizziness which pt is using antivert with relief.  Pt also had decreased appetite started megace last week.  Has gained 3 lbs since last weighed.

## 2018-01-15 NOTE — Patient Instructions (Signed)
Patient has MDS, refractory anemia which requires head to be positioned in ways not feasible with a normal bed. Head must be elevated at least 30-35 degrees or patient will have difficulty breathing. Due to MDS, anemia patient has difficulty breathing that frequently requires frequent changes in body position which cannot be achieved with a normal bed.

## 2018-01-16 ENCOUNTER — Other Ambulatory Visit: Payer: Self-pay | Admitting: Oncology

## 2018-01-16 DIAGNOSIS — D469 Myelodysplastic syndrome, unspecified: Secondary | ICD-10-CM

## 2018-01-19 LAB — BPAM RBC
BLOOD PRODUCT EXPIRATION DATE: 201903062359
UNIT TYPE AND RH: 5100

## 2018-01-19 LAB — TYPE AND SCREEN
ABO/RH(D): O POS
Antibody Screen: POSITIVE
DAT, IgG: POSITIVE
DAT, complement: NEGATIVE
Unit division: 0

## 2018-01-20 ENCOUNTER — Inpatient Hospital Stay: Payer: Medicare Other | Attending: Oncology

## 2018-01-20 ENCOUNTER — Inpatient Hospital Stay: Payer: Medicare Other

## 2018-01-20 DIAGNOSIS — Z79899 Other long term (current) drug therapy: Secondary | ICD-10-CM | POA: Diagnosis not present

## 2018-01-20 DIAGNOSIS — Z853 Personal history of malignant neoplasm of breast: Secondary | ICD-10-CM | POA: Insufficient documentation

## 2018-01-20 DIAGNOSIS — R011 Cardiac murmur, unspecified: Secondary | ICD-10-CM | POA: Insufficient documentation

## 2018-01-20 DIAGNOSIS — Z87442 Personal history of urinary calculi: Secondary | ICD-10-CM | POA: Diagnosis not present

## 2018-01-20 DIAGNOSIS — Z9010 Acquired absence of unspecified breast and nipple: Secondary | ICD-10-CM | POA: Diagnosis not present

## 2018-01-20 DIAGNOSIS — K449 Diaphragmatic hernia without obstruction or gangrene: Secondary | ICD-10-CM | POA: Diagnosis not present

## 2018-01-20 DIAGNOSIS — F17210 Nicotine dependence, cigarettes, uncomplicated: Secondary | ICD-10-CM | POA: Insufficient documentation

## 2018-01-20 DIAGNOSIS — Z7982 Long term (current) use of aspirin: Secondary | ICD-10-CM | POA: Insufficient documentation

## 2018-01-20 DIAGNOSIS — D469 Myelodysplastic syndrome, unspecified: Secondary | ICD-10-CM | POA: Diagnosis not present

## 2018-01-20 DIAGNOSIS — I10 Essential (primary) hypertension: Secondary | ICD-10-CM | POA: Insufficient documentation

## 2018-01-20 DIAGNOSIS — Z801 Family history of malignant neoplasm of trachea, bronchus and lung: Secondary | ICD-10-CM | POA: Diagnosis not present

## 2018-01-20 DIAGNOSIS — J449 Chronic obstructive pulmonary disease, unspecified: Secondary | ICD-10-CM | POA: Insufficient documentation

## 2018-01-20 DIAGNOSIS — D61818 Other pancytopenia: Secondary | ICD-10-CM | POA: Insufficient documentation

## 2018-01-20 LAB — CBC WITH DIFFERENTIAL/PLATELET
Basophils Absolute: 0 10*3/uL (ref 0–0.1)
Basophils Relative: 1 %
Eosinophils Absolute: 0 10*3/uL (ref 0–0.7)
Eosinophils Relative: 0 %
HEMATOCRIT: 20.6 % — AB (ref 35.0–47.0)
HEMOGLOBIN: 7.2 g/dL — AB (ref 12.0–16.0)
LYMPHS ABS: 1.1 10*3/uL (ref 1.0–3.6)
LYMPHS PCT: 59 %
MCH: 33.2 pg (ref 26.0–34.0)
MCHC: 35.1 g/dL (ref 32.0–36.0)
MCV: 94.6 fL (ref 80.0–100.0)
MONO ABS: 0.1 10*3/uL — AB (ref 0.2–0.9)
MONOS PCT: 6 %
NEUTROS ABS: 0.6 10*3/uL — AB (ref 1.4–6.5)
NEUTROS PCT: 34 %
Platelets: 159 10*3/uL (ref 150–440)
RBC: 2.18 MIL/uL — ABNORMAL LOW (ref 3.80–5.20)
RDW: 18.9 % — AB (ref 11.5–14.5)
WBC: 1.9 10*3/uL — ABNORMAL LOW (ref 3.6–11.0)

## 2018-01-20 LAB — SAMPLE TO BLOOD BANK

## 2018-01-20 MED ORDER — HEPARIN SOD (PORK) LOCK FLUSH 100 UNIT/ML IV SOLN
500.0000 [IU] | Freq: Every day | INTRAVENOUS | Status: DC | PRN
  Filled 2018-01-20: qty 5

## 2018-01-20 MED ORDER — SODIUM CHLORIDE 0.9% FLUSH
10.0000 mL | INTRAVENOUS | Status: DC | PRN
  Administered 2018-01-20: 10 mL via INTRAVENOUS
  Filled 2018-01-20: qty 10

## 2018-01-20 MED ORDER — ACETAMINOPHEN 325 MG PO TABS
650.0000 mg | ORAL_TABLET | Freq: Once | ORAL | Status: AC
  Administered 2018-01-20: 650 mg via ORAL
  Filled 2018-01-20: qty 2

## 2018-01-20 MED ORDER — HEPARIN SOD (PORK) LOCK FLUSH 100 UNIT/ML IV SOLN
500.0000 [IU] | Freq: Once | INTRAVENOUS | Status: AC
  Administered 2018-01-20: 500 [IU] via INTRAVENOUS

## 2018-01-20 MED ORDER — DIPHENHYDRAMINE HCL 50 MG/ML IJ SOLN
25.0000 mg | Freq: Once | INTRAMUSCULAR | Status: AC
  Administered 2018-01-20: 25 mg via INTRAVENOUS
  Filled 2018-01-20: qty 1

## 2018-01-20 MED ORDER — SODIUM CHLORIDE 0.9 % IV SOLN
250.0000 mL | Freq: Once | INTRAVENOUS | Status: AC
  Administered 2018-01-20: 250 mL via INTRAVENOUS
  Filled 2018-01-20: qty 250

## 2018-01-21 LAB — PREPARE RBC (CROSSMATCH)

## 2018-01-22 ENCOUNTER — Other Ambulatory Visit: Payer: Self-pay

## 2018-01-22 ENCOUNTER — Ambulatory Visit: Payer: Self-pay | Admitting: Oncology

## 2018-01-22 ENCOUNTER — Ambulatory Visit: Payer: Self-pay

## 2018-01-23 LAB — TYPE AND SCREEN
ABO/RH(D): O POS
ANTIBODY SCREEN: POSITIVE
Donor AG Type: NEGATIVE
Unit division: 0
Unit division: 0

## 2018-01-23 LAB — BPAM RBC
BLOOD PRODUCT EXPIRATION DATE: 201903152359
BLOOD PRODUCT EXPIRATION DATE: 201904052359
ISSUE DATE / TIME: 201903051126
UNIT TYPE AND RH: 5100
Unit Type and Rh: 5100

## 2018-01-25 NOTE — Progress Notes (Signed)
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  INTERVAL HISTORY: Patient returns to clinic today for further evaluation and consideration of additional blood or Procrit.  She reports having more energy this week, but still feels persistently weak and fatigued. Her appetite has improved and she has gained weight in the interim. She has no neurologic complaints. She denies any recent fevers, chills, night sweats, or weight loss.  She denies any chest pain or shortness of breath. She has no nausea, vomiting, constipation, or diarrhea. She has no melena or hematochezia. She has no urinary complaints.  Patient offers no further specific complaints today.  REVIEW OF SYSTEMS:   Review of Systems  Constitutional: Positive for weight loss. Negative for fever and malaise/fatigue.  Eyes: Negative for blurred vision, double vision and pain.  Respiratory: Negative.  Negative for cough and shortness of breath.   Cardiovascular: Negative.  Negative for chest pain and leg swelling.  Gastrointestinal: Negative.  Negative for abdominal pain, blood in stool, constipation, diarrhea, melena, nausea and vomiting.  Genitourinary: Negative.   Musculoskeletal: Negative.  Negative for falls and joint pain.  Skin: Negative.  Negative for rash.  Neurological: Positive for weakness. Negative for dizziness and sensory change.  Psychiatric/Behavioral: Positive for memory loss. The patient is not nervous/anxious and does not have insomnia.     As per HPI. Otherwise, a complete review of systems is negative.  PAST MEDICAL HISTORY: Past Medical History:  Diagnosis Date  . Breast cancer ([place])    mastectomy  . COPD  (chronic obstructive pulmonary disease) ([place])   . Heart murmur   . Hiatal hernia   . Hypertension   . UTI (lower urinary tract infection)   . UTI (urinary tract infection)    frequent in past    PAST SURGICAL HISTORY: Past Surgical History:  Procedure Laterality Date  . ABDOMINAL HYSTERECTOMY    . BONE MARROW ASPIRATION  2018  . FOOT SURGERY     for hammertoe  . MASTECTOMY Left    modified radical in August 1996  . PORTACATH PLACEMENT N/A 12/09/2017   Procedure: INSERTION PORT-A-CATH;  Surgeon: Vickie Epley, MD;  Location: [facility] ORS;  Service: Vascular;  Laterality: N/A;    FAMILY HISTORY Family History  Problem Relation Age of Onset  . Cancer Brother        lung  . Hypertension Brother   . Anemia Brother   . Myelodysplastic syndrome Brother        ADVANCED DIRECTIVES:    HEALTH MAINTENANCE: Social History   Tobacco Use  . Smoking status: Current Some Day Smoker    Packs/day: 0.25    Types: Cigarettes  . Smokeless tobacco: Never Used  Substance Use Topics  . Alcohol use: No  . Drug use: No     Colonoscopy:  PAP:  Bone density:  Lipid panel:  Allergies  Allergen Reactions  . Nitrofurantoin Other (See Comments)  . Penicillins Itching    Has patient had a PCN reaction causing immediate rash, facial/tongue/throat swelling, SOB or lightheadedness with hypotension: Yes Has patient had a PCN reaction causing severe rash involving mucus membranes or skin necrosis: Unknown Has patient had a PCN reaction that required hospitalization: Unknown Has patient had a PCN reaction  occurring within the last 10 years: Yes If all of the above answers are "NO", then may proceed with Cephalosporin use.  . Sulfa Antibiotics Itching    Current Outpatient Medications  Medication Sig Dispense Refill  . acetaminophen (RA ACETAMINOPHEN) 650 MG CR tablet Take 650 mg by mouth every 8 (eight) hours as needed.     Marland Kitchen alendronate (FOSAMAX) 70 MG tablet Take 70 mg by mouth once a  week. Saturaday  0  . Ascorbic Acid (VITAMIN C) 1000 MG tablet Take 1,000 mg by mouth daily.    Marland Kitchen aspirin 81 MG tablet Take 81 mg by mouth daily.    . calcium-vitamin D (OSCAL WITH D) 250-125 MG-UNIT per tablet Take 1 tablet by mouth daily.    Marland Kitchen gabapentin (NEURONTIN) 100 MG capsule Take 1 capsule by mouth at bedtime.    . megestrol (MEGACE) 40 MG tablet Take 1 tablet (40 mg total) by mouth daily. 30 tablet 2  . Multiple Minerals (JOINT HEALTH MINERAL PO) Take 1 tablet by mouth daily.    . Multiple Vitamin (MULTIVITAMIN) tablet Take 1 tablet by mouth daily.    . psyllium (REGULOID) 0.52 g capsule Take 0.52 g by mouth daily. Metamucil    . sodium chloride (OCEAN) 0.65 % SOLN nasal spray Place 1 spray into both nostrils as needed for congestion.    . valsartan-hydrochlorothiazide (DIOVAN-HCT) 80-12.5 MG tablet     . meclizine (ANTIVERT) 25 MG tablet Take 1 tablet (25 mg total) by mouth 3 (three) times daily as needed for dizziness. 30 tablet 0   No current facility-administered medications for this visit.    Facility-Administered Medications Ordered in Other Visits  Medication Dose Route Frequency Provider Last Rate Last Dose  . sodium chloride flush (NS) 0.9 % injection 10 mL  10 mL Intravenous PRN [name], MD   10 mL at 01/29/18 0839    OBJECTIVE: Vitals:   01/29/18 0852  BP: (!) 123/45  Pulse: 62  Resp: 18  Temp: 97.9 F (36.6 C)     Body mass index is 18.43 kg/m.    ECOG FS:3 - Symptomatic, >50% confined to bed  General: Thin, no acute distress.  Sitting in a wheelchair Eyes: Pink conjunctiva, anicteric sclera. Lungs: Clear to auscultation bilaterally. Heart: Regular rate and rhythm. No rubs, murmurs, or gallops. Abdomen: Soft, nontender, nondistended. No organomegaly noted, normoactive bowel sounds. Musculoskeletal: No edema, cyanosis, or clubbing. Neuro: Alert, answering all questions appropriately. Cranial nerves grossly intact. Skin: No rashes or petechiae  noted. Psych: Normal affect.  LAB RESULTS:  Lab Results  Component Value Date   NA 134 (L) 01/04/2018   K 3.7 01/04/2018   CL 104 01/04/2018   CO2 24 01/04/2018   GLUCOSE 94 01/04/2018   BUN 19 01/04/2018   CREATININE 0.72 01/04/2018   CALCIUM 8.8 (L) 01/04/2018   PROT 6.2 (L) 01/04/2018   ALBUMIN 3.4 (L) 01/04/2018   AST 25 01/04/2018   ALT 14 01/04/2018   ALKPHOS 45 01/04/2018   BILITOT 1.3 (H) 01/04/2018   GFRNONAA >60 01/04/2018   GFRAA >60 01/04/2018    Lab Results  Component Value Date   WBC 1.5 (L) 01/29/2018   NEUTROABS 0.3 (L) 01/29/2018   HGB 7.5 (L) 01/29/2018   HCT 21.2 (L) 01/29/2018   MCV 92.7 01/29/2018   PLT 75 (L) 01/29/2018   Lab Results  Component Value Date   FERRITIN 595 (H) 10/01/2017   Lab Results  Component Value Date   IRON  214 (H) 10/01/2017   TIBC 256 10/01/2017   IRONPCTSAT 83 (H) 10/01/2017      STUDIES: Dg Chest 2 View  Result Date: 01/01/2018 CLINICAL DATA:  Increase weakness.  Anemia. EXAM: CHEST  2 VIEW COMPARISON:  12/09/2017. FINDINGS: PowerPort catheter noted with tip over the. Superior vena cava. Lungs are clear. Cardiomegaly with normal pulmonary vascularity. Mild right base subsegmental atelectasis. No pleural effusion or pneumothorax. IMPRESSION: PowerPort catheter noted with tip over the superior vena cava. 2.  Cardiomegaly with normal pulmonary vascularity. 3.  Mild right base subsegmental atelectasis. Electronically Signed   By: Marcello Moores  Register   On: 01/01/2018 13:53   Ct Head Wo Contrast  Result Date: 01/04/2018 CLINICAL DATA:  Altered level of consciousness. Urinary tract infection. No reported injury. EXAM: CT HEAD WITHOUT CONTRAST TECHNIQUE: Contiguous axial images were obtained from the base of the skull through the vertex without intravenous contrast. COMPARISON:  09/24/2007 head CT FINDINGS: Brain: No evidence of parenchymal hemorrhage or extra-axial fluid collection. No mass lesion, mass effect, or midline  shift. No CT evidence of acute infarction. Nonspecific moderate subcortical and periventricular white matter hypodensity, most in keeping with chronic small vessel ischemic change. Generalized cerebral volume loss. Cerebral ventricle sizes are stable and concordant with the degree of cerebral volume loss. Vascular: No acute abnormality. Skull: No evidence of calvarial fracture. Sinuses/Orbits: The visualized paranasal sinuses are essentially clear. Other: Small partial right mastoid effusion. Clear left mastoid air cells. IMPRESSION: 1.  No evidence of acute intracranial abnormality. 2. Generalized cerebral volume loss. Moderate chronic small vessel ischemic changes in the cerebral white matter. 3. Nonspecific small partial right mastoid effusion. Electronically Signed   By: Ilona Sorrel M.D.   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  PLAN:    1. 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  Patient also noted increased blast count, but unclear how much.  She also was noted to have an increase in clonal plasma cells possibly indicating an additional plasma cell dyscrasia.  There is a suggestion of myeloblasts in her peripheral smear and slide was sent for pathology review.  Patient's performance status is declining and end-of-life hospice was again discussed in the setting that she may be converting to acute leukemia.  Patient is not ready to pursue this option.  Patient has developed antibodies in her blood making it difficult to find a match, therefore she will return the next week for 1 unit of packed red blood cells.  Will hold Procrit given the concern for transition to AML.  Return to clinic on February 09, 2018 for laboratory work and further evaluation, patient will then receive blood on February 12, 2018 if necessary.     2.   History of breast cancer: No evidence of disease.  Patient's mammograms are ordered by her PCP. 3.  Hypertension: Blood pressure is within normal limits today. Continue treatment per PCP. 4.  Leukopenia/neutropenia: Secondary to underlying MDS. Bone marrow biopsy as above. 5.  Thrombocytopenia: Patient's platelet count is trending down.  Likely secondary to progressive MDS/possible conversion to AML.  Monitor. 6.  Elevated iron stores: Patient will require Desferal with transfusions.  Approximately 30 minutes was spent in discussion of which greater than 50% was consultation.  Patient expressed understanding and was in agreement with this plan. She also understands that She  can call clinic at any time with any questions, concerns, or complaints.    [name], MD 01/31/18 9:03 AM

## 2018-01-27 ENCOUNTER — Telehealth: Payer: Self-pay | Admitting: *Deleted

## 2018-01-27 DIAGNOSIS — E119 Type 2 diabetes mellitus without complications: Secondary | ICD-10-CM | POA: Diagnosis not present

## 2018-01-27 DIAGNOSIS — B351 Tinea unguium: Secondary | ICD-10-CM | POA: Diagnosis not present

## 2018-01-27 DIAGNOSIS — I70203 Unspecified atherosclerosis of native arteries of extremities, bilateral legs: Secondary | ICD-10-CM | POA: Diagnosis not present

## 2018-01-27 DIAGNOSIS — E1142 Type 2 diabetes mellitus with diabetic polyneuropathy: Secondary | ICD-10-CM | POA: Diagnosis not present

## 2018-01-27 DIAGNOSIS — L608 Other nail disorders: Secondary | ICD-10-CM | POA: Diagnosis not present

## 2018-01-27 NOTE — Telephone Encounter (Signed)
error 

## 2018-01-28 DIAGNOSIS — F17210 Nicotine dependence, cigarettes, uncomplicated: Secondary | ICD-10-CM | POA: Diagnosis not present

## 2018-01-28 DIAGNOSIS — D469 Myelodysplastic syndrome, unspecified: Secondary | ICD-10-CM | POA: Diagnosis not present

## 2018-01-28 DIAGNOSIS — J449 Chronic obstructive pulmonary disease, unspecified: Secondary | ICD-10-CM | POA: Diagnosis not present

## 2018-01-28 DIAGNOSIS — I10 Essential (primary) hypertension: Secondary | ICD-10-CM | POA: Diagnosis not present

## 2018-01-28 DIAGNOSIS — Z853 Personal history of malignant neoplasm of breast: Secondary | ICD-10-CM | POA: Diagnosis not present

## 2018-01-28 DIAGNOSIS — K449 Diaphragmatic hernia without obstruction or gangrene: Secondary | ICD-10-CM | POA: Diagnosis not present

## 2018-01-29 ENCOUNTER — Inpatient Hospital Stay: Payer: Medicare Other

## 2018-01-29 ENCOUNTER — Inpatient Hospital Stay (HOSPITAL_BASED_OUTPATIENT_CLINIC_OR_DEPARTMENT_OTHER): Payer: Medicare Other | Admitting: Oncology

## 2018-01-29 ENCOUNTER — Encounter: Payer: Self-pay | Admitting: Oncology

## 2018-01-29 VITALS — BP 123/45 | HR 62 | Temp 97.9°F | Resp 18 | Ht 67.0 in | Wt 117.7 lb

## 2018-01-29 DIAGNOSIS — Z9010 Acquired absence of unspecified breast and nipple: Secondary | ICD-10-CM

## 2018-01-29 DIAGNOSIS — R011 Cardiac murmur, unspecified: Secondary | ICD-10-CM

## 2018-01-29 DIAGNOSIS — J449 Chronic obstructive pulmonary disease, unspecified: Secondary | ICD-10-CM | POA: Diagnosis not present

## 2018-01-29 DIAGNOSIS — K449 Diaphragmatic hernia without obstruction or gangrene: Secondary | ICD-10-CM | POA: Diagnosis not present

## 2018-01-29 DIAGNOSIS — Z853 Personal history of malignant neoplasm of breast: Secondary | ICD-10-CM | POA: Diagnosis not present

## 2018-01-29 DIAGNOSIS — Z87442 Personal history of urinary calculi: Secondary | ICD-10-CM

## 2018-01-29 DIAGNOSIS — I10 Essential (primary) hypertension: Secondary | ICD-10-CM | POA: Diagnosis not present

## 2018-01-29 DIAGNOSIS — D469 Myelodysplastic syndrome, unspecified: Secondary | ICD-10-CM

## 2018-01-29 DIAGNOSIS — Z7982 Long term (current) use of aspirin: Secondary | ICD-10-CM | POA: Diagnosis not present

## 2018-01-29 DIAGNOSIS — Z79899 Other long term (current) drug therapy: Secondary | ICD-10-CM

## 2018-01-29 DIAGNOSIS — Z801 Family history of malignant neoplasm of trachea, bronchus and lung: Secondary | ICD-10-CM

## 2018-01-29 DIAGNOSIS — D61818 Other pancytopenia: Secondary | ICD-10-CM | POA: Diagnosis not present

## 2018-01-29 DIAGNOSIS — F17210 Nicotine dependence, cigarettes, uncomplicated: Secondary | ICD-10-CM | POA: Diagnosis not present

## 2018-01-29 LAB — TYPE AND SCREEN
ABO/RH(D): O POS
Antibody Screen: POSITIVE
DAT, IgG: POSITIVE
DAT, complement: NEGATIVE

## 2018-01-29 LAB — SAMPLE TO BLOOD BANK

## 2018-01-29 LAB — PREPARE RBC (CROSSMATCH)

## 2018-01-29 LAB — CBC WITH DIFFERENTIAL/PLATELET
BASOS ABS: 0 10*3/uL (ref 0–0.1)
Basophils Relative: 1 %
EOS PCT: 0 %
Eosinophils Absolute: 0 10*3/uL (ref 0–0.7)
HEMATOCRIT: 21.2 % — AB (ref 35.0–47.0)
Hemoglobin: 7.5 g/dL — ABNORMAL LOW (ref 12.0–16.0)
LYMPHS ABS: 1.1 10*3/uL (ref 1.0–3.6)
Lymphocytes Relative: 72 %
MCH: 32.6 pg (ref 26.0–34.0)
MCHC: 35.2 g/dL (ref 32.0–36.0)
MCV: 92.7 fL (ref 80.0–100.0)
MONOS PCT: 6 %
Monocytes Absolute: 0.1 10*3/uL — ABNORMAL LOW (ref 0.2–0.9)
Neutro Abs: 0.3 10*3/uL — ABNORMAL LOW (ref 1.4–6.5)
Neutrophils Relative %: 21 %
PLATELETS: 75 10*3/uL — AB (ref 150–440)
RBC: 2.29 MIL/uL — ABNORMAL LOW (ref 3.80–5.20)
RDW: 16.9 % — AB (ref 11.5–14.5)
WBC: 1.5 10*3/uL — ABNORMAL LOW (ref 3.6–11.0)

## 2018-01-29 MED ORDER — HEPARIN SOD (PORK) LOCK FLUSH 100 UNIT/ML IV SOLN
500.0000 [IU] | Freq: Once | INTRAVENOUS | Status: AC
  Administered 2018-01-29: 500 [IU] via INTRAVENOUS

## 2018-01-29 MED ORDER — SODIUM CHLORIDE 0.9% FLUSH
10.0000 mL | INTRAVENOUS | Status: AC | PRN
  Administered 2018-01-29: 10 mL via INTRAVENOUS
  Filled 2018-01-29: qty 10

## 2018-01-29 NOTE — Progress Notes (Signed)
No new changes noted today 

## 2018-01-30 ENCOUNTER — Inpatient Hospital Stay: Payer: Medicare Other

## 2018-01-30 DIAGNOSIS — J449 Chronic obstructive pulmonary disease, unspecified: Secondary | ICD-10-CM | POA: Diagnosis not present

## 2018-01-30 DIAGNOSIS — Z853 Personal history of malignant neoplasm of breast: Secondary | ICD-10-CM | POA: Diagnosis not present

## 2018-01-30 DIAGNOSIS — D61818 Other pancytopenia: Secondary | ICD-10-CM | POA: Diagnosis not present

## 2018-01-30 DIAGNOSIS — D469 Myelodysplastic syndrome, unspecified: Secondary | ICD-10-CM

## 2018-01-30 DIAGNOSIS — I10 Essential (primary) hypertension: Secondary | ICD-10-CM | POA: Diagnosis not present

## 2018-01-31 ENCOUNTER — Other Ambulatory Visit: Payer: Self-pay | Admitting: Oncology

## 2018-01-31 DIAGNOSIS — D469 Myelodysplastic syndrome, unspecified: Secondary | ICD-10-CM

## 2018-02-02 ENCOUNTER — Inpatient Hospital Stay: Payer: Medicare Other

## 2018-02-02 VITALS — BP 132/74 | HR 68 | Temp 97.4°F | Resp 20

## 2018-02-02 DIAGNOSIS — Z853 Personal history of malignant neoplasm of breast: Secondary | ICD-10-CM | POA: Diagnosis not present

## 2018-02-02 DIAGNOSIS — I10 Essential (primary) hypertension: Secondary | ICD-10-CM | POA: Diagnosis not present

## 2018-02-02 DIAGNOSIS — D469 Myelodysplastic syndrome, unspecified: Secondary | ICD-10-CM | POA: Diagnosis not present

## 2018-02-02 DIAGNOSIS — J449 Chronic obstructive pulmonary disease, unspecified: Secondary | ICD-10-CM | POA: Diagnosis not present

## 2018-02-02 DIAGNOSIS — D61818 Other pancytopenia: Secondary | ICD-10-CM | POA: Diagnosis not present

## 2018-02-02 MED ORDER — SODIUM CHLORIDE 0.9 % IV SOLN
250.0000 mL | Freq: Once | INTRAVENOUS | Status: AC
  Administered 2018-02-02: 250 mL via INTRAVENOUS
  Filled 2018-02-02: qty 250

## 2018-02-02 MED ORDER — ACETAMINOPHEN 325 MG PO TABS
650.0000 mg | ORAL_TABLET | Freq: Once | ORAL | Status: AC
  Administered 2018-02-02: 650 mg via ORAL
  Filled 2018-02-02: qty 2

## 2018-02-02 MED ORDER — SODIUM CHLORIDE 0.9 % IV SOLN
15.0000 mg/kg/h | Freq: Once | INTRAVENOUS | Status: AC
  Administered 2018-02-02: 15 mg/kg/h via INTRAVENOUS
  Filled 2018-02-02: qty 2

## 2018-02-02 MED ORDER — HEPARIN SOD (PORK) LOCK FLUSH 100 UNIT/ML IV SOLN
500.0000 [IU] | Freq: Once | INTRAVENOUS | Status: AC
  Administered 2018-02-02: 500 [IU] via INTRAVENOUS

## 2018-02-02 MED ORDER — DIPHENHYDRAMINE HCL 50 MG/ML IJ SOLN
25.0000 mg | Freq: Once | INTRAMUSCULAR | Status: AC
  Administered 2018-02-02: 25 mg via INTRAVENOUS
  Filled 2018-02-02: qty 1

## 2018-02-03 LAB — TYPE AND SCREEN
ABO/RH(D): O POS
Antibody Screen: POSITIVE
DAT, COMPLEMENT: NEGATIVE
DAT, IgG: POSITIVE
Donor AG Type: NEGATIVE
Unit division: 0

## 2018-02-03 LAB — BPAM RBC
Blood Product Expiration Date: 201904042359
ISSUE DATE / TIME: 201903181301
UNIT TYPE AND RH: 5100

## 2018-02-03 LAB — PREPARE RBC (CROSSMATCH)

## 2018-02-08 NOTE — Progress Notes (Signed)
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  INTERVAL HISTORY: Patient returns to clinic today for further evaluation and consideration of additional blood. She continues to have persistent weakness and fatigued. Her appetite has improved and she continues to gain weight. She has no neurologic complaints. She denies any recent fevers, chills, night sweats, or weight loss.  She denies any chest pain or shortness of breath. She has no nausea, vomiting, constipation, or diarrhea. She has no melena or hematochezia. She has no urinary complaints.  Patient offers no further specific complaints today.  REVIEW OF SYSTEMS:   Review of Systems  Constitutional: Positive for malaise/fatigue. Negative for fever and weight loss.  Eyes: Negative for blurred vision, double vision and pain.  Respiratory: Negative.  Negative for cough and shortness of breath.   Cardiovascular: Negative.  Negative for chest pain and leg swelling.  Gastrointestinal: Negative.  Negative for abdominal pain, blood in stool, constipation, diarrhea, melena, nausea and vomiting.  Genitourinary: Negative.   Musculoskeletal: Negative.  Negative for falls and joint pain.  Skin: Negative.  Negative for rash.  Neurological: Positive for weakness. Negative for dizziness and sensory change.  Psychiatric/Behavioral: Positive for memory loss. The patient is not nervous/anxious and does not have insomnia.     As per HPI. Otherwise, a complete review of systems is negative.  PAST MEDICAL HISTORY: Past Medical History:  Diagnosis Date  . Breast cancer ([place])    mastectomy  . COPD (chronic obstructive pulmonary disease) ([place])   . Heart  murmur   . Hiatal hernia   . Hypertension   . UTI (lower urinary tract infection)   . UTI (urinary tract infection)    frequent in past    PAST SURGICAL HISTORY: Past Surgical History:  Procedure Laterality Date  . ABDOMINAL HYSTERECTOMY    . BONE MARROW ASPIRATION  2018  . FOOT SURGERY     for hammertoe  . MASTECTOMY Left    modified radical in August 1996  . PORTACATH PLACEMENT N/A 12/09/2017   Procedure: INSERTION PORT-A-CATH;  Surgeon: Vickie Epley, MD;  Location: [facility] ORS;  Service: Vascular;  Laterality: N/A;    FAMILY HISTORY Family History  Problem Relation Age of Onset  . Cancer Brother        lung  . Hypertension Brother   . Anemia Brother   . Myelodysplastic syndrome Brother        ADVANCED DIRECTIVES:    HEALTH MAINTENANCE: Social History   Tobacco Use  . Smoking status: Current Some Day Smoker    Packs/day: 0.25    Types: Cigarettes  . Smokeless tobacco: Never Used  Substance Use Topics  . Alcohol use: No  . Drug use: No     Colonoscopy:  PAP:  Bone density:  Lipid panel:  Allergies  Allergen Reactions  . Nitrofurantoin Other (See Comments)  . Penicillins Itching    Has patient had a PCN reaction causing immediate rash, facial/tongue/throat swelling, SOB or lightheadedness with hypotension: Yes Has patient had a PCN reaction causing severe rash involving mucus membranes or skin necrosis: Unknown Has patient had a PCN reaction that required hospitalization: Unknown Has patient had a PCN reaction occurring within the last 10 years: Yes If all of the  above answers are "NO", then may proceed with Cephalosporin use.  . Sulfa Antibiotics Itching    Current Outpatient Medications  Medication Sig Dispense Refill  . acetaminophen (RA ACETAMINOPHEN) 650 MG CR tablet Take 650 mg by mouth every 8 (eight) hours as needed.     Marland Kitchen alendronate (FOSAMAX) 70 MG tablet Take 70 mg by mouth once a week. Saturaday  0  . Ascorbic Acid (VITAMIN C) 1000 MG  tablet Take 1,000 mg by mouth daily.    Marland Kitchen aspirin 81 MG tablet Take 81 mg by mouth daily.    . calcium-vitamin D (OSCAL WITH D) 250-125 MG-UNIT per tablet Take 1 tablet by mouth daily.    Marland Kitchen gabapentin (NEURONTIN) 100 MG capsule Take 1 capsule by mouth at bedtime.    . meclizine (ANTIVERT) 25 MG tablet Take 1 tablet (25 mg total) by mouth 3 (three) times daily as needed for dizziness. 30 tablet 0  . megestrol (MEGACE) 40 MG tablet Take 1 tablet (40 mg total) by mouth daily. 30 tablet 2  . Multiple Minerals (JOINT HEALTH MINERAL PO) Take 1 tablet by mouth daily.    . Multiple Vitamin (MULTIVITAMIN) tablet Take 1 tablet by mouth daily.    . psyllium (REGULOID) 0.52 g capsule Take 0.52 g by mouth daily. Metamucil    . sodium chloride (OCEAN) 0.65 % SOLN nasal spray Place 1 spray into both nostrils as needed for congestion.    . valsartan-hydrochlorothiazide (DIOVAN-HCT) 80-12.5 MG tablet      No current facility-administered medications for this visit.    Facility-Administered Medications Ordered in Other Visits  Medication Dose Route Frequency Provider Last Rate Last Dose  . sodium chloride flush (NS) 0.9 % injection 10 mL  10 mL Intravenous PRN [name], MD   10 mL at 01/29/18 0839    OBJECTIVE: Vitals:   02/09/18 1029  BP: 138/60  Pulse: 80  Resp: 18  Temp: (!) 97.5 F (36.4 C)     Body mass index is 18.32 kg/m.    ECOG FS:3 - Symptomatic, >50% confined to bed  General: Thin, no acute distress.  Sitting in a wheelchair Eyes: Pink conjunctiva, anicteric sclera. Lungs: Clear to auscultation bilaterally. Heart: Regular rate and rhythm. No rubs, murmurs, or gallops. Abdomen: Soft, nontender, nondistended. No organomegaly noted, normoactive bowel sounds. Musculoskeletal: No edema, cyanosis, or clubbing. Neuro: Alert, answering all questions appropriately. Cranial nerves grossly intact. Skin: No rashes or petechiae noted. Psych: Normal affect.  LAB RESULTS:  Lab Results    Component Value Date   NA 134 (L) 01/04/2018   K 3.7 01/04/2018   CL 104 01/04/2018   CO2 24 01/04/2018   GLUCOSE 94 01/04/2018   BUN 19 01/04/2018   CREATININE 0.72 01/04/2018   CALCIUM 8.8 (L) 01/04/2018   PROT 6.2 (L) 01/04/2018   ALBUMIN 3.4 (L) 01/04/2018   AST 25 01/04/2018   ALT 14 01/04/2018   ALKPHOS 45 01/04/2018   BILITOT 1.3 (H) 01/04/2018   GFRNONAA >60 01/04/2018   GFRAA >60 01/04/2018    Lab Results  Component Value Date   WBC 1.3 (LL) 02/09/2018   NEUTROABS 0.3 (L) 02/09/2018   HGB 8.6 (L) 02/09/2018   HCT 24.8 (L) 02/09/2018   MCV 89.6 02/09/2018   PLT 76 (L) 02/09/2018   Lab Results  Component Value Date   FERRITIN 595 (H) 10/01/2017   Lab Results  Component Value Date   IRON 214 (H) 10/01/2017   TIBC 256 10/01/2017  IRONPCTSAT 83 (H) 10/01/2017      STUDIES: No results found.  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  PLAN:    1. 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  Patient also noted increased blast count, but unclear how much.  She also was noted to have an increase in clonal plasma cells possibly indicating an additional plasma cell dyscrasia.  There is a suggestion of myeloblasts in her peripheral smear and slide was sent for pathology review.  Patient's performance status is declining and end-of-life hospice was recently discussed in the setting that she may be converting to acute leukemia.  Patient is not ready to pursue this option.  Patient has developed antibodies in her blood making it difficult to find a match.  Patient does not require transfusion this week.  Return to clinic in 1 week for laboratory work and further evaluation and consideration of blood transfusion later in the week.  Will hold Procrit given the concern for transition to AML.  Have ordered official pathology  review of her peripheral smear.     2.  History of breast cancer: No evidence of disease.  Patient's mammograms are ordered by her PCP. 3.  Hypertension: Blood pressure is within normal limits today. Continue treatment per PCP. 4.  Leukopenia/neutropenia: Secondary to underlying MDS. Bone marrow biopsy as above. 5.  Thrombocytopenia: Patient's platelet count is decreased but stable. Monitor. 6.  Elevated iron stores: Patient will require Desferal with transfusions.  Approximately 30 minutes was spent in discussion of which greater than 50% was consultation.  Patient expressed understanding and was in agreement with this plan. She also understands that She can call clinic at any time with any questions, concerns, or complaints.    [name], MD 02/09/18 10:39 AM

## 2018-02-09 ENCOUNTER — Encounter: Payer: Self-pay | Admitting: Oncology

## 2018-02-09 ENCOUNTER — Inpatient Hospital Stay: Payer: Medicare Other

## 2018-02-09 ENCOUNTER — Inpatient Hospital Stay (HOSPITAL_BASED_OUTPATIENT_CLINIC_OR_DEPARTMENT_OTHER): Payer: Medicare Other | Admitting: Oncology

## 2018-02-09 VITALS — BP 138/60 | HR 80 | Temp 97.5°F | Resp 18 | Wt 117.0 lb

## 2018-02-09 DIAGNOSIS — D469 Myelodysplastic syndrome, unspecified: Secondary | ICD-10-CM

## 2018-02-09 DIAGNOSIS — D61818 Other pancytopenia: Secondary | ICD-10-CM | POA: Diagnosis not present

## 2018-02-09 DIAGNOSIS — Z853 Personal history of malignant neoplasm of breast: Secondary | ICD-10-CM | POA: Diagnosis not present

## 2018-02-09 DIAGNOSIS — Z87442 Personal history of urinary calculi: Secondary | ICD-10-CM

## 2018-02-09 DIAGNOSIS — Z9010 Acquired absence of unspecified breast and nipple: Secondary | ICD-10-CM

## 2018-02-09 DIAGNOSIS — K449 Diaphragmatic hernia without obstruction or gangrene: Secondary | ICD-10-CM

## 2018-02-09 DIAGNOSIS — Z801 Family history of malignant neoplasm of trachea, bronchus and lung: Secondary | ICD-10-CM

## 2018-02-09 DIAGNOSIS — F17210 Nicotine dependence, cigarettes, uncomplicated: Secondary | ICD-10-CM

## 2018-02-09 DIAGNOSIS — Z7982 Long term (current) use of aspirin: Secondary | ICD-10-CM

## 2018-02-09 DIAGNOSIS — J449 Chronic obstructive pulmonary disease, unspecified: Secondary | ICD-10-CM

## 2018-02-09 DIAGNOSIS — I10 Essential (primary) hypertension: Secondary | ICD-10-CM | POA: Diagnosis not present

## 2018-02-09 DIAGNOSIS — Z79899 Other long term (current) drug therapy: Secondary | ICD-10-CM

## 2018-02-09 DIAGNOSIS — R011 Cardiac murmur, unspecified: Secondary | ICD-10-CM

## 2018-02-09 LAB — CBC WITH DIFFERENTIAL/PLATELET
BASOS ABS: 0 10*3/uL (ref 0–0.1)
BASOS PCT: 1 %
Eosinophils Absolute: 0 10*3/uL (ref 0–0.7)
Eosinophils Relative: 0 %
HEMATOCRIT: 24.8 % — AB (ref 35.0–47.0)
HEMOGLOBIN: 8.6 g/dL — AB (ref 12.0–16.0)
Lymphocytes Relative: 70 %
Lymphs Abs: 0.9 10*3/uL — ABNORMAL LOW (ref 1.0–3.6)
MCH: 31.1 pg (ref 26.0–34.0)
MCHC: 34.7 g/dL (ref 32.0–36.0)
MCV: 89.6 fL (ref 80.0–100.0)
MONO ABS: 0.1 10*3/uL — AB (ref 0.2–0.9)
MONOS PCT: 7 %
NEUTROS ABS: 0.3 10*3/uL — AB (ref 1.4–6.5)
NEUTROS PCT: 22 %
Platelets: 76 10*3/uL — ABNORMAL LOW (ref 150–440)
RBC: 2.77 MIL/uL — ABNORMAL LOW (ref 3.80–5.20)
RDW: 15.9 % — AB (ref 11.5–14.5)
WBC: 1.3 10*3/uL — CL (ref 3.6–11.0)

## 2018-02-09 LAB — SAMPLE TO BLOOD BANK

## 2018-02-09 NOTE — Progress Notes (Signed)
Pt in for follow up, denies any difficulties or concerns.  

## 2018-02-12 ENCOUNTER — Ambulatory Visit: Payer: Self-pay

## 2018-02-16 ENCOUNTER — Inpatient Hospital Stay (HOSPITAL_BASED_OUTPATIENT_CLINIC_OR_DEPARTMENT_OTHER): Payer: Medicare Other | Admitting: Oncology

## 2018-02-16 ENCOUNTER — Inpatient Hospital Stay: Payer: Medicare Other | Attending: Oncology

## 2018-02-16 ENCOUNTER — Encounter: Payer: Self-pay | Admitting: Oncology

## 2018-02-16 VITALS — BP 138/82 | HR 69 | Temp 97.9°F | Resp 15 | Wt 121.0 lb

## 2018-02-16 DIAGNOSIS — Z7982 Long term (current) use of aspirin: Secondary | ICD-10-CM | POA: Insufficient documentation

## 2018-02-16 DIAGNOSIS — R5383 Other fatigue: Secondary | ICD-10-CM | POA: Diagnosis not present

## 2018-02-16 DIAGNOSIS — D72819 Decreased white blood cell count, unspecified: Secondary | ICD-10-CM | POA: Insufficient documentation

## 2018-02-16 DIAGNOSIS — D709 Neutropenia, unspecified: Secondary | ICD-10-CM | POA: Diagnosis not present

## 2018-02-16 DIAGNOSIS — R159 Full incontinence of feces: Secondary | ICD-10-CM

## 2018-02-16 DIAGNOSIS — K449 Diaphragmatic hernia without obstruction or gangrene: Secondary | ICD-10-CM | POA: Insufficient documentation

## 2018-02-16 DIAGNOSIS — M545 Low back pain, unspecified: Secondary | ICD-10-CM

## 2018-02-16 DIAGNOSIS — I10 Essential (primary) hypertension: Secondary | ICD-10-CM | POA: Diagnosis not present

## 2018-02-16 DIAGNOSIS — F17210 Nicotine dependence, cigarettes, uncomplicated: Secondary | ICD-10-CM | POA: Diagnosis not present

## 2018-02-16 DIAGNOSIS — Z801 Family history of malignant neoplasm of trachea, bronchus and lung: Secondary | ICD-10-CM | POA: Diagnosis not present

## 2018-02-16 DIAGNOSIS — R531 Weakness: Secondary | ICD-10-CM

## 2018-02-16 DIAGNOSIS — Z79899 Other long term (current) drug therapy: Secondary | ICD-10-CM | POA: Insufficient documentation

## 2018-02-16 DIAGNOSIS — D469 Myelodysplastic syndrome, unspecified: Secondary | ICD-10-CM | POA: Diagnosis not present

## 2018-02-16 DIAGNOSIS — R32 Unspecified urinary incontinence: Secondary | ICD-10-CM | POA: Insufficient documentation

## 2018-02-16 DIAGNOSIS — R011 Cardiac murmur, unspecified: Secondary | ICD-10-CM

## 2018-02-16 DIAGNOSIS — J449 Chronic obstructive pulmonary disease, unspecified: Secondary | ICD-10-CM | POA: Insufficient documentation

## 2018-02-16 DIAGNOSIS — Z9012 Acquired absence of left breast and nipple: Secondary | ICD-10-CM

## 2018-02-16 DIAGNOSIS — D696 Thrombocytopenia, unspecified: Secondary | ICD-10-CM | POA: Diagnosis not present

## 2018-02-16 DIAGNOSIS — Z87442 Personal history of urinary calculi: Secondary | ICD-10-CM | POA: Insufficient documentation

## 2018-02-16 DIAGNOSIS — R300 Dysuria: Secondary | ICD-10-CM | POA: Diagnosis not present

## 2018-02-16 DIAGNOSIS — Z853 Personal history of malignant neoplasm of breast: Secondary | ICD-10-CM | POA: Insufficient documentation

## 2018-02-16 DIAGNOSIS — R413 Other amnesia: Secondary | ICD-10-CM | POA: Diagnosis not present

## 2018-02-16 LAB — CBC WITH DIFFERENTIAL/PLATELET
Basophils Absolute: 0 10*3/uL (ref 0–0.1)
Basophils Relative: 1 %
EOS ABS: 0 10*3/uL (ref 0–0.7)
EOS PCT: 0 %
HCT: 21 % — ABNORMAL LOW (ref 35.0–47.0)
HEMOGLOBIN: 7.2 g/dL — AB (ref 12.0–16.0)
LYMPHS ABS: 1 10*3/uL (ref 1.0–3.6)
Lymphocytes Relative: 63 %
MCH: 30.5 pg (ref 26.0–34.0)
MCHC: 34.1 g/dL (ref 32.0–36.0)
MCV: 89.5 fL (ref 80.0–100.0)
MONOS PCT: 11 %
Monocytes Absolute: 0.2 10*3/uL (ref 0.2–0.9)
Neutro Abs: 0.4 10*3/uL — ABNORMAL LOW (ref 1.4–6.5)
Neutrophils Relative %: 25 %
PLATELETS: 67 10*3/uL — AB (ref 150–440)
RBC: 2.35 MIL/uL — ABNORMAL LOW (ref 3.80–5.20)
RDW: 16.1 % — AB (ref 11.5–14.5)
WBC: 1.6 10*3/uL — ABNORMAL LOW (ref 3.6–11.0)

## 2018-02-16 LAB — SAMPLE TO BLOOD BANK

## 2018-02-16 NOTE — Progress Notes (Signed)
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  INTERVAL HISTORY: Patient returns to clinic today for further evaluation and consideration of additional blood products. She continues to have persistent weakness and fatigue. Appetite has improved dramatically with the prescription of Megace. Weight is up 4 lbs. New complaint of lower back pain. Patient states she noticed this approximately 3 weeks ago. She has not tried anything for the pain except for 500 mg Tylenol this morning. She admits to mild relief. She denies any recent falls or injuries to her back. She does admit to decreased activity level. She requires 24-hour assistance and spends most time in her chair or bed. Requiring help with all ADLs. Frequently incontinent of urine and bowel. Asking for suggestions on how to prevent accidents from occurring. She denies any chest pain, shortness of breath, nausea, vomiting or constipation. She denies any urinary complaints.   REVIEW OF SYSTEMS:   Review of Systems  Constitutional: Positive for malaise/fatigue. Negative for chills, fever and weight loss.  HENT: Negative for congestion and ear pain.   Eyes: Negative.  Negative for blurred vision and double vision.  Respiratory: Negative.  Negative for cough, sputum production and shortness of breath.   Cardiovascular: Negative.  Negative for chest pain, palpitations and leg swelling.  Gastrointestinal: Negative.  Negative for abdominal pain, constipation, diarrhea (occasionally incontinent- normally formed), nausea and vomiting.       Incontinent at times of both stool and urine  Genitourinary: Positive for urgency (occasionally incontinent).  Negative for dysuria and frequency.  Musculoskeletal: Positive for back pain. Negative for falls.  Skin: Negative.  Negative for rash.  Neurological: Negative.  Negative for sensory change, weakness and headaches.  Endo/Heme/Allergies: Negative.  Does not bruise/bleed easily.  Psychiatric/Behavioral: Negative.  Negative for depression. The patient is not nervous/anxious and does not have insomnia.     As per HPI. Otherwise, a complete review of systems is negative.  PAST MEDICAL HISTORY: Past Medical History:  Diagnosis Date  . Breast cancer ([place])    mastectomy  . COPD (chronic obstructive pulmonary disease) ([place])   . Heart murmur   . Hiatal hernia   . Hypertension   . UTI (lower urinary tract infection)   . UTI (urinary tract infection)    frequent in past    PAST SURGICAL HISTORY: Past Surgical History:  Procedure Laterality Date  . ABDOMINAL HYSTERECTOMY    . BONE MARROW ASPIRATION  2018  . FOOT SURGERY     for hammertoe  . MASTECTOMY Left    modified radical in August 1996  . PORTACATH PLACEMENT N/A 12/09/2017   Procedure: INSERTION PORT-A-CATH;  Surgeon: Vickie Epley, MD;  Location: [facility] ORS;  Service: Vascular;  Laterality: N/A;    FAMILY HISTORY Family History  Problem Relation Age of Onset  . Cancer Brother        lung  . Hypertension Brother   . Anemia Brother   . Myelodysplastic syndrome Brother        ADVANCED DIRECTIVES:    HEALTH MAINTENANCE: Social History   Tobacco Use  . Smoking status: Current Some Day Smoker    Packs/day: 0.25    Types: Cigarettes  . Smokeless tobacco: Never Used  Substance Use Topics  .  Alcohol use: No  . Drug use: No     Colonoscopy:  PAP:  Bone density:  Lipid panel:  Allergies  Allergen Reactions  . Nitrofurantoin Other (See Comments)  . Penicillins Itching    Has patient had a PCN reaction causing immediate rash, facial/tongue/throat swelling, SOB or lightheadedness with hypotension: Yes Has patient  had a PCN reaction causing severe rash involving mucus membranes or skin necrosis: Unknown Has patient had a PCN reaction that required hospitalization: Unknown Has patient had a PCN reaction occurring within the last 10 years: Yes If all of the above answers are "NO", then may proceed with Cephalosporin use.  . Sulfa Antibiotics Itching    Current Outpatient Medications  Medication Sig Dispense Refill  . acetaminophen (RA ACETAMINOPHEN) 650 MG CR tablet Take 650 mg by mouth every 8 (eight) hours as needed.     Marland Kitchen alendronate (FOSAMAX) 70 MG tablet Take 70 mg by mouth once a week. Saturaday  0  . Ascorbic Acid (VITAMIN C) 1000 MG tablet Take 1,000 mg by mouth daily.    Marland Kitchen aspirin 81 MG tablet Take 81 mg by mouth daily.    . calcium-vitamin D (OSCAL WITH D) 250-125 MG-UNIT per tablet Take 1 tablet by mouth daily.    Marland Kitchen gabapentin (NEURONTIN) 100 MG capsule Take 1 capsule by mouth at bedtime.    . meclizine (ANTIVERT) 25 MG tablet Take 1 tablet (25 mg total) by mouth 3 (three) times daily as needed for dizziness. 30 tablet 0  . megestrol (MEGACE) 40 MG tablet Take 1 tablet (40 mg total) by mouth daily. 30 tablet 2  . Multiple Minerals (JOINT HEALTH MINERAL PO) Take 1 tablet by mouth daily.    . Multiple Vitamin (MULTIVITAMIN) tablet Take 1 tablet by mouth daily.    . psyllium (REGULOID) 0.52 g capsule Take 0.52 g by mouth daily. Metamucil    . sodium chloride (OCEAN) 0.65 % SOLN nasal spray Place 1 spray into both nostrils as needed for congestion.    . valsartan-hydrochlorothiazide (DIOVAN-HCT) 80-12.5 MG tablet      No current facility-administered medications for this visit.    Facility-Administered Medications Ordered in Other Visits  Medication Dose Route Frequency Provider Last Rate Last Dose  . sodium chloride flush (NS) 0.9 % injection 10 mL  10 mL Intravenous PRN [name], MD   10 mL at 01/29/18 0839    OBJECTIVE: Vitals:   02/16/18 1120  BP: 138/82  Pulse: 69  Resp:  15  Temp: 97.9 F (36.6 C)     Body mass index is 18.95 kg/m.    ECOG FS:3 - Symptomatic, >50% confined to bed  Physical Exam  Constitutional: She is oriented to person, place, and time and well-developed, well-nourished, and in no distress. Vital signs are normal.  HENT:  Head: Normocephalic and atraumatic.  Eyes: Pupils are equal, round, and reactive to light.  Neck: Normal range of motion.  Cardiovascular: Normal rate, regular rhythm and normal heart sounds.  No murmur heard. Pulmonary/Chest: Effort normal and breath sounds normal. She has no wheezes.  Abdominal: Soft. Normal appearance and bowel sounds are normal. She exhibits no distension. There is no tenderness.  Musculoskeletal: Normal range of motion. She exhibits no edema.  Neurological: She is alert and oriented to person, place, and time. Gait normal.  Skin: Skin is warm, dry and intact. No rash noted. There is pallor.  Psychiatric: Mood, memory, affect and judgment normal.   LAB RESULTS:  Lab Results  Component Value Date   NA 134 (L) 01/04/2018   K 3.7 01/04/2018   CL 104 01/04/2018   CO2 24 01/04/2018   GLUCOSE 94 01/04/2018   BUN 19 01/04/2018   CREATININE 0.72 01/04/2018   CALCIUM 8.8 (L) 01/04/2018   PROT 6.2 (L) 01/04/2018   ALBUMIN 3.4 (L) 01/04/2018   AST 25 01/04/2018   ALT 14 01/04/2018   ALKPHOS 45 01/04/2018   BILITOT 1.3 (H) 01/04/2018   GFRNONAA >60 01/04/2018   GFRAA >60 01/04/2018    Lab Results  Component Value Date   WBC 1.6 (L) 02/16/2018   NEUTROABS 0.4 (L) 02/16/2018   HGB 7.2 (L) 02/16/2018   HCT 21.0 (L) 02/16/2018   MCV 89.5 02/16/2018   PLT 67 (L) 02/16/2018   Lab Results  Component Value Date   FERRITIN 595 (H) 10/01/2017   Lab Results  Component Value Date   IRON 214 (H) 10/01/2017   TIBC 256 10/01/2017   IRONPCTSAT 83 (H) 10/01/2017      STUDIES: No results found.  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  PLAN:    1. MDS:  Patient's bone  marrow biopsy revealed trilineage dyspoiesis consistent with MDS.  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  Patient also noted increased blast count, but unclear how much. She also was noted to have an increase in clonal plasma cells possibly indicating an additional plasma cell dyscrasia.  There is a suggestion of myeloblasts in her peripheral smear and slide was sent for pathology review.  Patient's performance status is declining and end-of-life hospice was recently discussed in the setting that she may be converting to acute leukemia.  Patient is not ready to pursue this option.  Patient has developed antibodies in her blood making it difficult to find a match.    She will require a transfusion this week.  Return to clinic on Thursday for blood transfusion. Patient is interested in having labs drawn at home on Monday possibly by advanced home care and then coming in for transfusions on Thursday. Still waiting to find out if she has transitioned to AML. Will continue to hold Procrit at this time.       2.  History of breast cancer: No evidence of disease.  Patient's mammograms are ordered by her PCP. 3.  Hypertension: Blood pressure is within normal limits today. Continue treatment per PCP. 4.  Leukopenia/neutropenia: Secondary to underlying MDS. Bone marrow biopsy as above. 5.  Thrombocytopenia: Patient's platelet count is decreased but stable. Monitor. 6.  Elevated iron stores: Patient will require Desferal with transfusions. 7.  Lower back pain: Began approximately 3 weeks per patient. Patient is at risk for fractures. Last Bone density is from 2014 and showed T-score of -1.5. Very unsteady on feet. Last fall was back in February where she was evaluated in ED. CT head, negative for intracranial abnormality. Patient took 500 mg tylenol this morning for the first time with some relief. Suggested she try 500 mg BID for several days to see if this  improves symptoms. Will also rule out UTI. UA and Urine Culture ordered. Can get imaging if needed. They would like to hold off at this time.  8. Loss of bowel/bladder function at times: Has progressively become worse. This is unlikely Cauda Equina or ESCC. Patient does not have history of known bony lesions. She denies any numbness or radicular pain in either leg. Offered imaging of her back  but patient would like to try OTC Tylenol 500 mg twice a day to see if this improves her pain. Will check back with her tomorrow to see if there is any improvement.  Greater than 50% was spent in counseling and coordination of care with this patient including but not limited to discussion of the relevant topics above (See A&P) including, but not limited to diagnosis and management of acute and chronic medical conditions.    Patient expressed understanding and was in agreement with this plan. She also understands that She can call clinic at any time with any questions, concerns, or complaints.    Jacquelin Hawking, NP 02/16/18 3:32 PM

## 2018-02-19 ENCOUNTER — Inpatient Hospital Stay: Payer: Medicare Other

## 2018-02-19 ENCOUNTER — Other Ambulatory Visit: Payer: Self-pay | Admitting: *Deleted

## 2018-02-19 ENCOUNTER — Other Ambulatory Visit: Payer: Self-pay | Admitting: Oncology

## 2018-02-19 DIAGNOSIS — R413 Other amnesia: Secondary | ICD-10-CM | POA: Diagnosis not present

## 2018-02-19 DIAGNOSIS — D469 Myelodysplastic syndrome, unspecified: Secondary | ICD-10-CM

## 2018-02-19 DIAGNOSIS — D72819 Decreased white blood cell count, unspecified: Secondary | ICD-10-CM | POA: Diagnosis not present

## 2018-02-19 DIAGNOSIS — R300 Dysuria: Secondary | ICD-10-CM | POA: Diagnosis not present

## 2018-02-19 DIAGNOSIS — D696 Thrombocytopenia, unspecified: Secondary | ICD-10-CM | POA: Diagnosis not present

## 2018-02-19 DIAGNOSIS — D709 Neutropenia, unspecified: Secondary | ICD-10-CM | POA: Diagnosis not present

## 2018-02-19 LAB — URINALYSIS, COMPLETE (UACMP) WITH MICROSCOPIC
BILIRUBIN URINE: NEGATIVE
Glucose, UA: NEGATIVE mg/dL
HGB URINE DIPSTICK: NEGATIVE
KETONES UR: NEGATIVE mg/dL
NITRITE: NEGATIVE
PROTEIN: NEGATIVE mg/dL
Specific Gravity, Urine: 1.013 (ref 1.005–1.030)
pH: 7 (ref 5.0–8.0)

## 2018-02-19 LAB — PREPARE RBC (CROSSMATCH)

## 2018-02-19 MED ORDER — DIPHENHYDRAMINE HCL 50 MG/ML IJ SOLN
25.0000 mg | Freq: Once | INTRAMUSCULAR | Status: AC
  Administered 2018-02-19: 25 mg via INTRAVENOUS
  Filled 2018-02-19: qty 1

## 2018-02-19 MED ORDER — ACETAMINOPHEN 325 MG PO TABS
650.0000 mg | ORAL_TABLET | Freq: Once | ORAL | Status: AC
  Administered 2018-02-19: 650 mg via ORAL
  Filled 2018-02-19: qty 2

## 2018-02-19 MED ORDER — HEPARIN SOD (PORK) LOCK FLUSH 100 UNIT/ML IV SOLN
500.0000 [IU] | Freq: Every day | INTRAVENOUS | Status: AC | PRN
  Administered 2018-02-19: 500 [IU]
  Filled 2018-02-19: qty 5

## 2018-02-19 MED ORDER — SODIUM CHLORIDE 0.9 % IV SOLN
15.0000 mg/kg/h | Freq: Once | INTRAVENOUS | Status: AC
  Administered 2018-02-19: 15 mg/kg/h via INTRAVENOUS
  Filled 2018-02-19: qty 2

## 2018-02-19 MED ORDER — SODIUM CHLORIDE 0.9 % IV SOLN
250.0000 mL | Freq: Once | INTRAVENOUS | Status: AC
  Administered 2018-02-19: 250 mL via INTRAVENOUS
  Filled 2018-02-19: qty 250

## 2018-02-20 LAB — TYPE AND SCREEN
ABO/RH(D): O POS
Antibody Screen: POSITIVE
DAT, IgG: POSITIVE
DAT, complement: NEGATIVE
Donor AG Type: NEGATIVE
Unit division: 0

## 2018-02-20 LAB — BPAM RBC
Blood Product Expiration Date: 201904262359
ISSUE DATE / TIME: 201904041332
UNIT TYPE AND RH: 5100

## 2018-02-23 ENCOUNTER — Inpatient Hospital Stay: Payer: Medicare Other

## 2018-02-23 ENCOUNTER — Ambulatory Visit
Admission: RE | Admit: 2018-02-23 | Discharge: 2018-02-23 | Disposition: A | Discharge status: Home / Self Care | Payer: Medicare Other | Source: Ambulatory Visit | Attending: Oncology | Admitting: Oncology

## 2018-02-23 ENCOUNTER — Other Ambulatory Visit: Payer: Self-pay

## 2018-02-23 ENCOUNTER — Other Ambulatory Visit: Payer: Self-pay | Admitting: Oncology

## 2018-02-23 ENCOUNTER — Inpatient Hospital Stay (HOSPITAL_BASED_OUTPATIENT_CLINIC_OR_DEPARTMENT_OTHER): Payer: Medicare Other | Admitting: Nurse Practitioner

## 2018-02-23 VITALS — BP 156/68 | HR 79 | Temp 97.6°F | Resp 18

## 2018-02-23 DIAGNOSIS — R300 Dysuria: Secondary | ICD-10-CM | POA: Diagnosis not present

## 2018-02-23 DIAGNOSIS — M545 Low back pain, unspecified: Secondary | ICD-10-CM

## 2018-02-23 DIAGNOSIS — R5383 Other fatigue: Secondary | ICD-10-CM | POA: Diagnosis not present

## 2018-02-23 DIAGNOSIS — I10 Essential (primary) hypertension: Secondary | ICD-10-CM

## 2018-02-23 DIAGNOSIS — R413 Other amnesia: Secondary | ICD-10-CM

## 2018-02-23 DIAGNOSIS — F17210 Nicotine dependence, cigarettes, uncomplicated: Secondary | ICD-10-CM

## 2018-02-23 DIAGNOSIS — D709 Neutropenia, unspecified: Secondary | ICD-10-CM | POA: Diagnosis not present

## 2018-02-23 DIAGNOSIS — R32 Unspecified urinary incontinence: Secondary | ICD-10-CM

## 2018-02-23 DIAGNOSIS — J449 Chronic obstructive pulmonary disease, unspecified: Secondary | ICD-10-CM

## 2018-02-23 DIAGNOSIS — Z853 Personal history of malignant neoplasm of breast: Secondary | ICD-10-CM

## 2018-02-23 DIAGNOSIS — D72819 Decreased white blood cell count, unspecified: Secondary | ICD-10-CM

## 2018-02-23 DIAGNOSIS — D469 Myelodysplastic syndrome, unspecified: Secondary | ICD-10-CM

## 2018-02-23 DIAGNOSIS — R531 Weakness: Secondary | ICD-10-CM

## 2018-02-23 DIAGNOSIS — Z9012 Acquired absence of left breast and nipple: Secondary | ICD-10-CM

## 2018-02-23 DIAGNOSIS — D696 Thrombocytopenia, unspecified: Secondary | ICD-10-CM | POA: Diagnosis not present

## 2018-02-23 DIAGNOSIS — R159 Full incontinence of feces: Secondary | ICD-10-CM

## 2018-02-23 DIAGNOSIS — R011 Cardiac murmur, unspecified: Secondary | ICD-10-CM

## 2018-02-23 DIAGNOSIS — R0781 Pleurodynia: Secondary | ICD-10-CM | POA: Diagnosis not present

## 2018-02-23 DIAGNOSIS — Z79899 Other long term (current) drug therapy: Secondary | ICD-10-CM

## 2018-02-23 DIAGNOSIS — K449 Diaphragmatic hernia without obstruction or gangrene: Secondary | ICD-10-CM

## 2018-02-23 DIAGNOSIS — Z7982 Long term (current) use of aspirin: Secondary | ICD-10-CM

## 2018-02-23 DIAGNOSIS — Z87442 Personal history of urinary calculi: Secondary | ICD-10-CM

## 2018-02-23 DIAGNOSIS — Z801 Family history of malignant neoplasm of trachea, bronchus and lung: Secondary | ICD-10-CM

## 2018-02-23 LAB — COMPREHENSIVE METABOLIC PANEL
ALK PHOS: 33 U/L — AB (ref 38–126)
ALT: 37 U/L (ref 14–54)
ANION GAP: 6 (ref 5–15)
AST: 27 U/L (ref 15–41)
Albumin: 3.9 g/dL (ref 3.5–5.0)
BILIRUBIN TOTAL: 1 mg/dL (ref 0.3–1.2)
BUN: 27 mg/dL — ABNORMAL HIGH (ref 6–20)
CALCIUM: 9.5 mg/dL (ref 8.9–10.3)
CO2: 21 mmol/L — AB (ref 22–32)
CREATININE: 0.97 mg/dL (ref 0.44–1.00)
Chloride: 106 mmol/L (ref 101–111)
GFR calc non Af Amer: 51 mL/min — ABNORMAL LOW (ref >60)
GFR, EST AFRICAN AMERICAN: 59 mL/min — AB (ref >60)
Glucose, Bld: 94 mg/dL (ref 65–99)
Potassium: 4.6 mmol/L (ref 3.5–5.1)
SODIUM: 133 mmol/L — AB (ref 135–145)
TOTAL PROTEIN: 6.9 g/dL (ref 6.5–8.1)

## 2018-02-23 LAB — CBC WITH DIFFERENTIAL/PLATELET
BASOS ABS: 0 10*3/uL (ref 0–0.1)
BASOS PCT: 1 %
EOS ABS: 0 10*3/uL (ref 0–0.7)
EOS PCT: 0 %
HCT: 25.9 % — ABNORMAL LOW (ref 35.0–47.0)
Hemoglobin: 9.2 g/dL — ABNORMAL LOW (ref 12.0–16.0)
Lymphocytes Relative: 56 %
Lymphs Abs: 0.9 10*3/uL — ABNORMAL LOW (ref 1.0–3.6)
MCH: 30.7 pg (ref 26.0–34.0)
MCHC: 35.4 g/dL (ref 32.0–36.0)
MCV: 86.6 fL (ref 80.0–100.0)
MONO ABS: 0.2 10*3/uL (ref 0.2–0.9)
Monocytes Relative: 11 %
Neutro Abs: 0.5 10*3/uL — ABNORMAL LOW (ref 1.4–6.5)
Neutrophils Relative %: 32 %
PLATELETS: 106 10*3/uL — AB (ref 150–440)
RBC: 2.99 MIL/uL — AB (ref 3.80–5.20)
RDW: 16 % — AB (ref 11.5–14.5)
WBC: 1.6 10*3/uL — AB (ref 3.6–11.0)

## 2018-02-23 LAB — URINALYSIS, COMPLETE (UACMP) WITH MICROSCOPIC
Bilirubin Urine: NEGATIVE
Glucose, UA: NEGATIVE mg/dL
HGB URINE DIPSTICK: NEGATIVE
Ketones, ur: NEGATIVE mg/dL
NITRITE: NEGATIVE
PH: 7 (ref 5.0–8.0)
Protein, ur: NEGATIVE mg/dL
SPECIFIC GRAVITY, URINE: 1.016 (ref 1.005–1.030)

## 2018-02-23 LAB — SAMPLE TO BLOOD BANK

## 2018-02-23 MED ORDER — CIPROFLOXACIN HCL 250 MG PO TABS: 250.0000 mg | ORAL_TABLET | Freq: Two times a day (BID) | ORAL | 0 refills | Status: DC

## 2018-02-23 NOTE — Progress Notes (Signed)
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 Grayland Horne, on 02/09/18. She has a history of MDS. Bone marrow biopsy revealed trilineage dyspoiesis consistent with MDS. Cytogenetics revealed 20q- which is commonly associated with MDS as well as PCV. She was also noted to have increased blast count. She was also noted to have an increase in clonal plasma cells possibly indicating an additional plasma cell dyscrasia. There is a suggestion of myeloblasts in her peripheral smear. Peripheral blood smear under review by pathology.  Recently end-of-life care and hospice were discussed in concern for conversion to acute leukemia but patient and family were not yet ready to pursue this option. Patient has developed antibodies making blood matching difficult. She receives transfusions if hmg < 7. Procrit currently held due to concern for transition to AML.   Interval history- Chelsea Horne presents with: abnormal smelling urine and foul smelling urine. History provided by patient's daughter d/t chronic memory loss/suspected dementia. Patient has had symptoms for one week. Denies associated symptoms including fever, chills, pain. Daughter denies recurrent UTI and patient has not had recent antibiotics.   ECOG FS:2 - Symptomatic, <50% confined to bed  Review of systems- ROS   Current treatment- EPO (last 01/15/2018)  Allergies  Allergen Reactions  . Nitrofurantoin Other (See Comments)  . Penicillins Itching    Has patient had a PCN  reaction causing immediate rash, facial/tongue/throat swelling, SOB or lightheadedness with hypotension: Yes Has patient had a PCN reaction causing severe rash involving mucus membranes or skin necrosis: Unknown Has patient had a PCN reaction that required hospitalization: Unknown Has patient had a PCN reaction occurring within the last 10 years: Yes If all of the above answers are "NO", then may proceed with Cephalosporin use.  . Sulfa Antibiotics Itching     Past Medical History:  Diagnosis Date  . Breast cancer ([place])    mastectomy  . COPD (chronic obstructive pulmonary disease) ([place])   . Heart murmur   . Hiatal hernia   . Hypertension   . UTI (lower urinary tract infection)   . UTI (urinary tract infection)    frequent in past     Past Surgical History:  Procedure Laterality Date  . ABDOMINAL HYSTERECTOMY    . BONE MARROW ASPIRATION  2018  . FOOT SURGERY     for hammertoe  . MASTECTOMY Left    modified radical in August 1996  . PORTACATH PLACEMENT N/A 12/09/2017   Procedure: INSERTION PORT-A-CATH;  Surgeon: Chelsea Epley, MD;  Location: [facility] ORS;  Service: Vascular;  Laterality: N/A;    Social History   Socioeconomic History  . Marital status: Widowed    Spouse name: Not on file  . Number of children: Not on file  . Years of education: Not on file  . Highest education level: Not on file  Occupational History  . Not on file  Social Needs  . Financial resource strain: Not on file  . Food insecurity:    Worry: Not on file    Inability: Not on file  . Transportation needs:  Medical: Not on file    Non-medical: Not on file  Tobacco Use  . Smoking status: Current Some Day Smoker    Packs/day: 0.25    Types: Cigarettes  . Smokeless tobacco: Never Used  Substance and Sexual Activity  . Alcohol use: No  . Drug use: No  . Sexual activity: Never  Lifestyle  . Physical activity:    Days per week: Not on file    Minutes per session: Not on file  . Stress:  Not on file  Relationships  . Social connections:    Talks on phone: Not on file    Gets together: Not on file    Attends religious service: Not on file    Active member of club or organization: Not on file    Attends meetings of clubs or organizations: Not on file    Relationship status: Not on file  . Intimate partner violence:    Fear of current or ex partner: Not on file    Emotionally abused: Not on file    Physically abused: Not on file    Forced sexual activity: Not on file  Other Topics Concern  . Not on file  Social History Narrative  . Not on file    Family History  Problem Relation Age of Onset  . Cancer Brother        lung  . Hypertension Brother   . Anemia Brother   . Myelodysplastic syndrome Brother      Current Outpatient Medications:  .  acetaminophen (RA ACETAMINOPHEN) 650 MG CR tablet, Take 650 mg by mouth every 8 (eight) hours as needed. , Disp: , Rfl:  .  alendronate (FOSAMAX) 70 MG tablet, Take 70 mg by mouth once a week. Saturaday, Disp: , Rfl: 0 .  Ascorbic Acid (VITAMIN C) 1000 MG tablet, Take 1,000 mg by mouth daily., Disp: , Rfl:  .  aspirin 81 MG tablet, Take 81 mg by mouth daily., Disp: , Rfl:  .  calcium-vitamin D (OSCAL WITH D) 250-125 MG-UNIT per tablet, Take 1 tablet by mouth daily., Disp: , Rfl:  .  gabapentin (NEURONTIN) 100 MG capsule, Take 1 capsule by mouth at bedtime., Disp: , Rfl:  .  meclizine (ANTIVERT) 25 MG tablet, Take 1 tablet (25 mg total) by mouth 3 (three) times daily as needed for dizziness., Disp: 30 tablet, Rfl: 0 .  megestrol (MEGACE) 40 MG tablet, Take 1 tablet (40 mg total) by mouth daily., Disp: 30 tablet, Rfl: 2 .  Multiple Minerals (JOINT HEALTH MINERAL PO), Take 1 tablet by mouth daily., Disp: , Rfl:  .  Multiple Vitamin (MULTIVITAMIN) tablet, Take 1 tablet by mouth daily., Disp: , Rfl:  .  psyllium (REGULOID) 0.52 g capsule, Take 0.52 g by mouth daily. Metamucil, Disp: , Rfl:  .  sodium chloride (OCEAN) 0.65 % SOLN  nasal spray, Place 1 spray into both nostrils as needed for congestion., Disp: , Rfl:  .  valsartan-hydrochlorothiazide (DIOVAN-HCT) 80-12.5 MG tablet, , Disp: , Rfl:  No current facility-administered medications for this visit.   Facility-Administered Medications Ordered in Other Visits:  .  sodium chloride flush (NS) 0.9 % injection 10 mL, 10 mL, Intravenous, PRN, [name], MD, 10 mL at 01/29/18 0839  Physical exam:  Vitals:   02/23/18 1149  BP: (!) 156/68  Pulse: 79  Resp: 18  Temp: 97.6 F (36.4 C)  TempSrc: Tympanic   Physical Exam  Constitutional:  Frail elderly female seen in exam room in  wheelchair. Accompanied.   HENT:  Head: Normocephalic and atraumatic.  Eyes: Pupils are equal, round, and reactive to light. Conjunctivae are normal.  Neck: Normal range of motion. Neck supple.  Cardiovascular: Normal rate and regular rhythm.  Pulmonary/Chest: Effort normal and breath sounds normal.  Abdominal: Soft. Bowel sounds are normal. She exhibits no distension. There is no tenderness.  Neurological: She is alert. She is disoriented.  Skin: Skin is warm and dry. There is pallor.  Psychiatric: Mood and affect normal.     CMP Latest Ref Rng & Units 01/04/2018  Glucose 65 - 99 mg/dL 94  BUN 6 - 20 mg/dL 19  Creatinine 0.44 - 1.00 mg/dL 0.72  Sodium 135 - 145 mmol/L 134(L)  Potassium 3.5 - 5.1 mmol/L 3.7  Chloride 101 - 111 mmol/L 104  CO2 22 - 32 mmol/L 24  Calcium 8.9 - 10.3 mg/dL 8.8(L)  Total Protein 6.5 - 8.1 g/dL 6.2(L)  Total Bilirubin 0.3 - 1.2 mg/dL 1.3(H)  Alkaline Phos 38 - 126 U/L 45  AST 15 - 41 U/L 25  ALT 14 - 54 U/L 14   CBC Latest Ref Rng & Units 02/23/2018  WBC 3.6 - 11.0 K/uL 1.6(L)  Hemoglobin 12.0 - 16.0 g/dL 9.2(L)  Hematocrit 35.0 - 47.0 % 25.9(L)  Platelets 150 - 440 K/uL 106(L)    Dg Ribs Unilateral Left  Result Date: 02/23/2018 CLINICAL DATA:  Rib pain EXAM: LEFT RIBS - 2 VIEW COMPARISON:  Chest x-ray 01/01/2018 FINDINGS: No fracture  or other bone lesions are seen involving the ribs. Degenerative changes at the left shoulder with probable rotator cuff disease. IMPRESSION: Negative. Degenerative changes at the left shoulder with suspected rotator cuff disease. Electronically Signed   By: Donavan Foil M.D.   On: 02/23/2018 15:00   Dg Ribs Unilateral Right  Result Date: 02/23/2018 CLINICAL DATA:  Right rib pain EXAM: RIGHT RIBS - 2 VIEW COMPARISON:  Chest x-ray 01/01/2018, CT chest 06/24/2018 FINDINGS: Right rib series demonstrates a right-sided port with tip over the SVC. Right apical pleural and parenchymal scarring. Rotator cuff disease at the right shoulder with degenerative changes at the [facility] joint and glenohumeral interval. No acute displaced right rib fracture. IMPRESSION: Negative. Electronically Signed   By: Donavan Foil M.D.   On: 02/23/2018 14:58   Dg Lumbar Spine Complete  Result Date: 02/23/2018 CLINICAL DATA:  Low back pain for 2 weeks EXAM: LUMBAR SPINE - COMPLETE 4+ VIEW COMPARISON:  05/27/2014 FINDINGS: Minimal scoliosis of the spine. Trace retrolisthesis of L3 on L4. Marked disc space narrowing with endplate sclerosis and osteophyte at L2-L3, L3-L4 and L5-S1, slight progression since 2015, particularly at L3-L4. Posterior facet degenerative changes L3 through S1. Dense aortic atherosclerosis. IMPRESSION: 1. Mild scoliosis of the spine with advanced degenerative changes L2 through L4 and L5-S1. 2. No acute osseous abnormality Electronically Signed   By: Donavan Foil M.D.   On: 02/23/2018 14:56     Assessment and plan- Patient is a 84 y.o. female with history of MDS who presents to [facility] for dysuria.   1. MDS- concern for progression to AML. Hospice previously discussed, but family declined. Peripheral smear pending path review. Continue transfusions for hmg < 7. No need for transfusion today as hmg 9.2. Continue to monitor.   2. Dysuria- Symptoms and UA consistent with acute UTI. Will start cipro  250 PO BID x 3 days pending culture and sensitivity. If sensitive will extend duration of treatment 4 additional days for a total  treatment duration of 7 days.    3. Memory impairment- chronic. Suspect dementia. Per family, no previous work-up. Could consider neurology evaluation but given underlying MDS and progression, hold referral at this time.   Visit Diagnosis 1. Myelodysplastic syndrome ([place])   2. Dysuria   3. Memory impairment     Patient expressed understanding and was in agreement with this plan. She also understands that She can call clinic at any time with any questions, concerns, or complaints.    Beckey Rutter, DNP, AGNP-C [place] at [facility] [phone] (work cell) [phone] (office) 02/23/18 12:23 PM

## 2018-02-23 NOTE — Patient Instructions (Addendum)
It was a pleasure meeting you today. We will call you with the results of your blood work and urine. Please go to the [facility] for x-rays. We will send a referral to Neurology for you to set up an appointment. Please notify me if you've had no improvement in your symptoms, or symptoms become worse, in the next 3-4 days. Thank you for allowing me to participate in your care. -Lauren   Dysuria Dysuria is pain or discomfort while urinating. The pain or discomfort may be felt in the tube that carries urine out of the bladder (urethra) or in the surrounding tissue of the genitals. The pain may also be felt in the groin area, lower abdomen, and lower back. You may have to urinate frequently or have the sudden feeling that you have to urinate (urgency). Dysuria can affect both men and women, but is more common in women. Dysuria can be caused by many different things, including:  Urinary tract infection in women.  Infection of the kidney or bladder.  Kidney stones or bladder stones.  Certain sexually transmitted infections (STIs), such as chlamydia.  Dehydration.  Inflammation of the vagina.  Use of certain medicines.  Use of certain soaps or scented products that cause irritation.  Follow these instructions at home: Watch your dysuria for any changes. The following actions may help to reduce any discomfort you are feeling:  Drink enough fluid to keep your urine clear or pale yellow.  Empty your bladder often. Avoid holding urine for long periods of time.  After a bowel movement or urination, women should cleanse from front to back, using each tissue only once.  Empty your bladder after sexual intercourse.  Take medicines only as directed by your health care provider.  If you were prescribed an antibiotic medicine, finish it all even if you start to feel better.  Avoid caffeine, tea, and alcohol. They can irritate the bladder and make dysuria worse. In men, alcohol may irritate the  prostate.  Keep all follow-up visits as directed by your health care provider. This is important.  If you had any tests done to find the cause of dysuria, it is your responsibility to obtain your test results. Ask the lab or department performing the test when and how you will get your results. Talk with your health care provider if you have any questions about your results.  Contact a health care provider if:  You develop pain in your back or sides.  You have a fever.  You have nausea or vomiting.  You have blood in your urine.  You are not urinating as often as you usually do. Get help right away if:  You pain is severe and not relieved with medicines.  You are unable to hold down any fluids.  You or someone else notices a change in your mental function.  You have a rapid heartbeat at rest.  You have shaking or chills.  You feel extremely weak. This information is not intended to replace advice given to you by your health care provider. Make sure you discuss any questions you have with your health care provider. Document Released: 08/02/2004 Document Revised: 04/11/2016 Document Reviewed: 06/30/2014 Elsevier Interactive Patient Education  [employer].

## 2018-02-23 NOTE — Progress Notes (Signed)
Patient here today for lab only visit, and her daughter requested that she be seen because she thinks she may have a UTI. Patient was here last week and seen by NP. UA from last week did not show defaunate UTI and patient did not have the xrays of the spine and ribs that were ordered.

## 2018-02-24 ENCOUNTER — Telehealth: Payer: Self-pay | Admitting: *Deleted

## 2018-02-25 LAB — URINE CULTURE: Culture: >=100000 — AB

## 2018-02-26 ENCOUNTER — Other Ambulatory Visit: Payer: Self-pay | Admitting: *Deleted

## 2018-02-26 ENCOUNTER — Ambulatory Visit: Payer: Self-pay

## 2018-02-26 MED ORDER — CIPROFLOXACIN HCL 250 MG PO TABS: 250.0000 mg | ORAL_TABLET | Freq: Two times a day (BID) | ORAL | 0 refills | Status: AC

## 2018-03-01 NOTE — Progress Notes (Signed)
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  INTERVAL HISTORY: Patient returns to clinic today for further evaluation and consideration of blood transfusion and Desferal.  She continues to have chronic weakness and fatigue that is unchanged.  She has no neurologic complaints. She denies any recent fevers, chills, night sweats, or weight loss.  She denies any chest pain or shortness of breath. She has no nausea, vomiting, constipation, or diarrhea. She has no melena or hematochezia. She has no urinary complaints.  Patient feels well and offers no further specific complaints today.  REVIEW OF SYSTEMS:   Review of Systems  Constitutional: Positive for malaise/fatigue. Negative for fever and weight loss.  Eyes: Negative for blurred vision, double vision and pain.  Respiratory: Negative.  Negative for cough and shortness of breath.   Cardiovascular: Negative.  Negative for chest pain and leg swelling.  Gastrointestinal: Negative.  Negative for abdominal pain, blood in stool, constipation, diarrhea, melena, nausea and vomiting.  Genitourinary: Negative.   Musculoskeletal: Negative.  Negative for falls and joint pain.  Skin: Negative.  Negative for rash.  Neurological: Positive for weakness. Negative for dizziness and sensory change.  Psychiatric/Behavioral: Positive for memory loss. The patient is not nervous/anxious and does not have insomnia.     As per HPI. Otherwise, a complete review of systems is negative.  PAST MEDICAL HISTORY: Past Medical History:  Diagnosis Date  . Breast cancer ([place])    mastectomy  . COPD (chronic obstructive pulmonary disease) ([place])   . Heart murmur   .  Hiatal hernia   . Hypertension   . UTI (lower urinary tract infection)   . UTI (urinary tract infection)    frequent in past    PAST SURGICAL HISTORY: Past Surgical History:  Procedure Laterality Date  . ABDOMINAL HYSTERECTOMY    . BONE MARROW ASPIRATION  2018  . FOOT SURGERY     for hammertoe  . MASTECTOMY Left    modified radical in August 1996  . PORTACATH PLACEMENT N/A 12/09/2017   Procedure: INSERTION PORT-A-CATH;  Surgeon: Vickie Epley, MD;  Location: [facility] ORS;  Service: Vascular;  Laterality: N/A;    FAMILY HISTORY Family History  Problem Relation Age of Onset  . Cancer Brother        lung  . Hypertension Brother   . Anemia Brother   . Myelodysplastic syndrome Brother        ADVANCED DIRECTIVES:    HEALTH MAINTENANCE: Social History   Tobacco Use  . Smoking status: Current Some Day Smoker    Packs/day: 0.25    Types: Cigarettes  . Smokeless tobacco: Never Used  Substance Use Topics  . Alcohol use: No  . Drug use: No     Colonoscopy:  PAP:  Bone density:  Lipid panel:  Allergies  Allergen Reactions  . Nitrofurantoin Other (See Comments)  . Penicillins Itching    Has patient had a PCN reaction causing immediate rash, facial/tongue/throat swelling, SOB or lightheadedness with hypotension: Yes Has patient had a PCN reaction causing severe rash involving mucus membranes or skin necrosis: Unknown Has patient had a PCN reaction that required hospitalization: Unknown Has patient had a PCN reaction occurring within the last 10 years: Yes If all of the  above answers are "NO", then may proceed with Cephalosporin use.  . Sulfa Antibiotics Itching    Current Outpatient Medications  Medication Sig Dispense Refill  . alendronate (FOSAMAX) 70 MG tablet Take 70 mg by mouth once a week. Saturaday  0  . Ascorbic Acid (VITAMIN C) 1000 MG tablet Take 1,000 mg by mouth daily.    Marland Kitchen aspirin 81 MG tablet Take 81 mg by mouth daily.    . calcium-vitamin D (OSCAL  WITH D) 250-125 MG-UNIT per tablet Take 1 tablet by mouth daily.    Marland Kitchen gabapentin (NEURONTIN) 100 MG capsule Take 1 capsule by mouth at bedtime.    . megestrol (MEGACE) 40 MG tablet Take 1 tablet (40 mg total) by mouth daily. 30 tablet 2  . Multiple Minerals (JOINT HEALTH MINERAL PO) Take 1 tablet by mouth daily.    . Multiple Vitamin (MULTIVITAMIN) tablet Take 1 tablet by mouth daily.    . psyllium (REGULOID) 0.52 g capsule Take 0.52 g by mouth daily. Metamucil    . valsartan-hydrochlorothiazide (DIOVAN-HCT) 80-12.5 MG tablet     . acetaminophen (RA ACETAMINOPHEN) 650 MG CR tablet Take 650 mg by mouth every 8 (eight) hours as needed.     . meclizine (ANTIVERT) 25 MG tablet Take 1 tablet (25 mg total) by mouth 3 (three) times daily as needed for dizziness. (Patient not taking: Reported on 03/05/2018) 30 tablet 0  . sodium chloride (OCEAN) 0.65 % SOLN nasal spray Place 1 spray into both nostrils as needed for congestion.     No current facility-administered medications for this visit.    Facility-Administered Medications Ordered in Other Visits  Medication Dose Route Frequency Provider Last Rate Last Dose  . sodium chloride flush (NS) 0.9 % injection 10 mL  10 mL Intravenous PRN [name], MD   10 mL at 01/29/18 0839    OBJECTIVE: Vitals:   03/05/18 0903  BP: (!) 144/74  Pulse: 79  Resp: 18  Temp: (!) 97.2 F (36.2 C)  SpO2: 99%     Body mass index is 18.48 kg/m.    ECOG FS:2 - Symptomatic, <50% confined to bed  General: Thin, no acute distress.  Sitting in a wheelchair.   Eyes: Pink conjunctiva, anicteric sclera. Lungs: Clear to auscultation bilaterally. Heart: Regular rate and rhythm. No rubs, murmurs, or gallops. Abdomen: Soft, nontender, nondistended. No organomegaly noted, normoactive bowel sounds. Musculoskeletal: No edema, cyanosis, or clubbing. Neuro: Alert, answering all questions appropriately. Cranial nerves grossly intact. Skin: No rashes or petechiae  noted. Psych: Normal affect.   LAB RESULTS:  Lab Results  Component Value Date   NA 136 03/02/2018   K 4.1 03/02/2018   CL 107 03/02/2018   CO2 21 (L) 03/02/2018   GLUCOSE 117 (H) 03/02/2018   BUN 37 (H) 03/02/2018   CREATININE 1.16 (H) 03/02/2018   CALCIUM 9.6 03/02/2018   PROT 6.9 03/02/2018   ALBUMIN 3.8 03/02/2018   AST 23 03/02/2018   ALT 21 03/02/2018   ALKPHOS 33 (L) 03/02/2018   BILITOT 0.9 03/02/2018   GFRNONAA 41 (L) 03/02/2018   GFRAA 47 (L) 03/02/2018    Lab Results  Component Value Date   WBC 1.7 (L) 03/09/2018   NEUTROABS 0.5 (L) 03/09/2018   HGB 10.7 (L) 03/09/2018   HCT 30.6 (L) 03/09/2018   MCV 86.9 03/09/2018   PLT 128 (L) 03/09/2018   Lab Results  Component Value Date   FERRITIN 595 (H) 10/01/2017   Lab Results  Component Value  Date   IRON 214 (H) 10/01/2017   TIBC 256 10/01/2017   IRONPCTSAT 83 (H) 10/01/2017      STUDIES: Dg Ribs Unilateral Left  Result Date: 02/23/2018 CLINICAL DATA:  Rib pain EXAM: LEFT RIBS - 2 VIEW COMPARISON:  Chest x-ray 01/01/2018 FINDINGS: No fracture or other bone lesions are seen involving the ribs. Degenerative changes at the left shoulder with probable rotator cuff disease. IMPRESSION: Negative. Degenerative changes at the left shoulder with suspected rotator cuff disease. Electronically Signed   By: Donavan Foil M.D.   On: 02/23/2018 15:00   Dg Ribs Unilateral Right  Result Date: 02/23/2018 CLINICAL DATA:  Right rib pain EXAM: RIGHT RIBS - 2 VIEW COMPARISON:  Chest x-ray 01/01/2018, CT chest 06/24/2018 FINDINGS: Right rib series demonstrates a right-sided port with tip over the SVC. Right apical pleural and parenchymal scarring. Rotator cuff disease at the right shoulder with degenerative changes at the [facility] joint and glenohumeral interval. No acute displaced right rib fracture. IMPRESSION: Negative. Electronically Signed   By: Donavan Foil M.D.   On: 02/23/2018 14:58   Dg Lumbar Spine Complete  Result Date:  02/23/2018 CLINICAL DATA:  Low back pain for 2 weeks EXAM: LUMBAR SPINE - COMPLETE 4+ VIEW COMPARISON:  05/27/2014 FINDINGS: Minimal scoliosis of the spine. Trace retrolisthesis of L3 on L4. Marked disc space narrowing with endplate sclerosis and osteophyte at L2-L3, L3-L4 and L5-S1, slight progression since 2015, particularly at L3-L4. Posterior facet degenerative changes L3 through S1. Dense aortic atherosclerosis. IMPRESSION: 1. Mild scoliosis of the spine with advanced degenerative changes L2 through L4 and L5-S1. 2. No acute osseous abnormality Electronically Signed   By: Donavan Foil M.D.   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  PLAN:    1. 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  Patient also noted increased blast count, but unclear how much.  She also was noted to have an increase in clonal plasma cells possibly indicating an additional plasma cell dyscrasia.  Patient's peripheral smear was reviewed by pathology did not reveal any blasts or myeloblasts.  End-of-life and hospice care was previously discussed, but patient is not ready to pursue this option.  Patient has developed antibodies in her blood making it difficult to find a match.  Proceed with 1 unit of packed red blood cells today.  Patient will also receive 40,000 units Procrit.  Return to clinic next week for laboratory work and consideration of blood and then in 2 weeks for further evaluation.   2.  History of breast cancer: No evidence of disease.  Patient's mammograms are ordered by her PCP. 3.  Hypertension: Blood pressure is within normal limits today. Continue treatment per PCP. 4.  Leukopenia/neutropenia: Patient's white blood cell count is approximately her baseline.  Monitor. 5.  Thrombocytopenia: Platelet count is slightly improved today.   Monitor.   6.  Elevated iron stores: Continue Desferal with transfusions.  Approximately 30 minutes spent in discussion of which greater than 50% was consultation.  Patient expressed understanding and was in agreement with this plan. She also understands that She can call clinic at any time with any questions, concerns, or complaints.    [name], MD 03/10/18 10:39 AM

## 2018-03-02 ENCOUNTER — Inpatient Hospital Stay: Payer: Medicare Other

## 2018-03-02 ENCOUNTER — Other Ambulatory Visit: Payer: Self-pay

## 2018-03-02 DIAGNOSIS — D72819 Decreased white blood cell count, unspecified: Secondary | ICD-10-CM | POA: Diagnosis not present

## 2018-03-02 DIAGNOSIS — D709 Neutropenia, unspecified: Secondary | ICD-10-CM | POA: Diagnosis not present

## 2018-03-02 DIAGNOSIS — D469 Myelodysplastic syndrome, unspecified: Secondary | ICD-10-CM

## 2018-03-02 DIAGNOSIS — D696 Thrombocytopenia, unspecified: Secondary | ICD-10-CM | POA: Diagnosis not present

## 2018-03-02 DIAGNOSIS — R300 Dysuria: Secondary | ICD-10-CM | POA: Diagnosis not present

## 2018-03-02 DIAGNOSIS — R413 Other amnesia: Secondary | ICD-10-CM | POA: Diagnosis not present

## 2018-03-02 LAB — COMPREHENSIVE METABOLIC PANEL
ALT: 21 U/L (ref 14–54)
ANION GAP: 8 (ref 5–15)
AST: 23 U/L (ref 15–41)
Albumin: 3.8 g/dL (ref 3.5–5.0)
Alkaline Phosphatase: 33 U/L — ABNORMAL LOW (ref 38–126)
BUN: 37 mg/dL — ABNORMAL HIGH (ref 6–20)
CALCIUM: 9.6 mg/dL (ref 8.9–10.3)
CHLORIDE: 107 mmol/L (ref 101–111)
CO2: 21 mmol/L — AB (ref 22–32)
Creatinine, Ser: 1.16 mg/dL — ABNORMAL HIGH (ref 0.44–1.00)
GFR calc non Af Amer: 41 mL/min — ABNORMAL LOW (ref >60)
GFR, EST AFRICAN AMERICAN: 47 mL/min — AB (ref >60)
Glucose, Bld: 117 mg/dL — ABNORMAL HIGH (ref 65–99)
Potassium: 4.1 mmol/L (ref 3.5–5.1)
SODIUM: 136 mmol/L (ref 135–145)
Total Bilirubin: 0.9 mg/dL (ref 0.3–1.2)
Total Protein: 6.9 g/dL (ref 6.5–8.1)

## 2018-03-02 LAB — CBC WITH DIFFERENTIAL/PLATELET
BASOS ABS: 0 10*3/uL (ref 0–0.1)
Basophils Relative: 2 %
Eosinophils Absolute: 0 10*3/uL (ref 0–0.7)
Eosinophils Relative: 1 %
HCT: 23.1 % — ABNORMAL LOW (ref 35.0–47.0)
Hemoglobin: 8.1 g/dL — ABNORMAL LOW (ref 12.0–16.0)
LYMPHS ABS: 0.9 10*3/uL — AB (ref 1.0–3.6)
Lymphocytes Relative: 71 %
MCH: 30.4 pg (ref 26.0–34.0)
MCHC: 34.8 g/dL (ref 32.0–36.0)
MCV: 87.2 fL (ref 80.0–100.0)
MONO ABS: 0.1 10*3/uL — AB (ref 0.2–0.9)
Monocytes Relative: 9 %
Neutro Abs: 0.2 10*3/uL — ABNORMAL LOW (ref 1.4–6.5)
Neutrophils Relative %: 17 %
Platelets: 94 10*3/uL — ABNORMAL LOW (ref 150–440)
RBC: 2.65 MIL/uL — AB (ref 3.80–5.20)
RDW: 16 % — AB (ref 11.5–14.5)
Smear Review: DECREASED
WBC: 1.2 10*3/uL — CL (ref 3.6–11.0)

## 2018-03-02 LAB — PATHOLOGIST SMEAR REVIEW

## 2018-03-02 LAB — SAMPLE TO BLOOD BANK

## 2018-03-05 ENCOUNTER — Inpatient Hospital Stay: Payer: Medicare Other

## 2018-03-05 ENCOUNTER — Encounter: Payer: Self-pay | Admitting: Nurse Practitioner

## 2018-03-05 ENCOUNTER — Inpatient Hospital Stay (HOSPITAL_BASED_OUTPATIENT_CLINIC_OR_DEPARTMENT_OTHER): Payer: Medicare Other | Admitting: Oncology

## 2018-03-05 ENCOUNTER — Other Ambulatory Visit: Payer: Self-pay | Admitting: Oncology

## 2018-03-05 ENCOUNTER — Encounter: Payer: Self-pay | Admitting: Oncology

## 2018-03-05 ENCOUNTER — Other Ambulatory Visit: Payer: Self-pay

## 2018-03-05 VITALS — BP 149/67 | HR 70 | Temp 97.5°F | Resp 18

## 2018-03-05 VITALS — BP 144/74 | HR 79 | Temp 97.2°F | Resp 18 | Ht 67.0 in | Wt 118.0 lb

## 2018-03-05 DIAGNOSIS — R159 Full incontinence of feces: Secondary | ICD-10-CM | POA: Diagnosis not present

## 2018-03-05 DIAGNOSIS — Z87442 Personal history of urinary calculi: Secondary | ICD-10-CM

## 2018-03-05 DIAGNOSIS — D469 Myelodysplastic syndrome, unspecified: Secondary | ICD-10-CM | POA: Diagnosis not present

## 2018-03-05 DIAGNOSIS — J449 Chronic obstructive pulmonary disease, unspecified: Secondary | ICD-10-CM

## 2018-03-05 DIAGNOSIS — R011 Cardiac murmur, unspecified: Secondary | ICD-10-CM

## 2018-03-05 DIAGNOSIS — R32 Unspecified urinary incontinence: Secondary | ICD-10-CM

## 2018-03-05 DIAGNOSIS — Z801 Family history of malignant neoplasm of trachea, bronchus and lung: Secondary | ICD-10-CM

## 2018-03-05 DIAGNOSIS — Z79899 Other long term (current) drug therapy: Secondary | ICD-10-CM

## 2018-03-05 DIAGNOSIS — M545 Low back pain: Secondary | ICD-10-CM

## 2018-03-05 DIAGNOSIS — D709 Neutropenia, unspecified: Secondary | ICD-10-CM | POA: Diagnosis not present

## 2018-03-05 DIAGNOSIS — Z853 Personal history of malignant neoplasm of breast: Secondary | ICD-10-CM

## 2018-03-05 DIAGNOSIS — D638 Anemia in other chronic diseases classified elsewhere: Secondary | ICD-10-CM

## 2018-03-05 DIAGNOSIS — R5383 Other fatigue: Secondary | ICD-10-CM

## 2018-03-05 DIAGNOSIS — R413 Other amnesia: Secondary | ICD-10-CM | POA: Diagnosis not present

## 2018-03-05 DIAGNOSIS — D72819 Decreased white blood cell count, unspecified: Secondary | ICD-10-CM | POA: Diagnosis not present

## 2018-03-05 DIAGNOSIS — I10 Essential (primary) hypertension: Secondary | ICD-10-CM | POA: Diagnosis not present

## 2018-03-05 DIAGNOSIS — Z7982 Long term (current) use of aspirin: Secondary | ICD-10-CM

## 2018-03-05 DIAGNOSIS — Z9012 Acquired absence of left breast and nipple: Secondary | ICD-10-CM

## 2018-03-05 DIAGNOSIS — D696 Thrombocytopenia, unspecified: Secondary | ICD-10-CM

## 2018-03-05 DIAGNOSIS — R300 Dysuria: Secondary | ICD-10-CM | POA: Diagnosis not present

## 2018-03-05 DIAGNOSIS — R531 Weakness: Secondary | ICD-10-CM

## 2018-03-05 DIAGNOSIS — K449 Diaphragmatic hernia without obstruction or gangrene: Secondary | ICD-10-CM

## 2018-03-05 DIAGNOSIS — F17210 Nicotine dependence, cigarettes, uncomplicated: Secondary | ICD-10-CM

## 2018-03-05 LAB — PREPARE RBC (CROSSMATCH)

## 2018-03-05 MED ORDER — DIPHENHYDRAMINE HCL 50 MG/ML IJ SOLN
25.0000 mg | Freq: Once | INTRAMUSCULAR | Status: AC
  Administered 2018-03-05: 25 mg via INTRAVENOUS

## 2018-03-05 MED ORDER — DIPHENHYDRAMINE HCL 50 MG/ML IJ SOLN
INTRAMUSCULAR | Status: AC
  Filled 2018-03-05: qty 1

## 2018-03-05 MED ORDER — ACETAMINOPHEN 325 MG PO TABS
650.0000 mg | ORAL_TABLET | Freq: Once | ORAL | Status: AC
  Administered 2018-03-05: 650 mg via ORAL

## 2018-03-05 MED ORDER — SODIUM CHLORIDE 0.9 % IV SOLN
250.0000 mL | Freq: Once | INTRAVENOUS | Status: DC
  Filled 2018-03-05: qty 250

## 2018-03-05 MED ORDER — HEPARIN SOD (PORK) LOCK FLUSH 100 UNIT/ML IV SOLN
500.0000 [IU] | Freq: Every day | INTRAVENOUS | Status: AC | PRN
  Administered 2018-03-05: 500 [IU]
  Filled 2018-03-05: qty 5

## 2018-03-05 MED ORDER — SODIUM CHLORIDE 0.9 % IV SOLN
15.0000 mg/kg/h | Freq: Once | INTRAVENOUS | Status: AC
  Administered 2018-03-05: 15 mg/kg/h via INTRAVENOUS
  Filled 2018-03-05: qty 2

## 2018-03-05 MED ORDER — SODIUM CHLORIDE 0.9 % IV SOLN
250.0000 mL | Freq: Once | INTRAVENOUS | Status: AC
  Administered 2018-03-05: 250 mL via INTRAVENOUS
  Filled 2018-03-05: qty 250

## 2018-03-05 MED ORDER — ACETAMINOPHEN 325 MG PO TABS
ORAL_TABLET | ORAL | Status: AC
  Filled 2018-03-05: qty 2

## 2018-03-05 MED ORDER — EPOETIN ALFA 40000 UNIT/ML IJ SOLN
40000.0000 [IU] | Freq: Once | INTRAMUSCULAR | Status: AC
  Administered 2018-03-05: 40000 [IU] via SUBCUTANEOUS
  Filled 2018-03-05: qty 1

## 2018-03-05 NOTE — Progress Notes (Signed)
No new changes noted today 

## 2018-03-06 LAB — BPAM RBC
Blood Product Expiration Date: 201905152359
ISSUE DATE / TIME: 201904181255
Unit Type and Rh: 5100

## 2018-03-06 LAB — TYPE AND SCREEN
ABO/RH(D): O POS
Antibody Screen: POSITIVE
DAT, COMPLEMENT: NEGATIVE
DAT, IgG: POSITIVE
Donor AG Type: NEGATIVE
Unit division: 0

## 2018-03-09 ENCOUNTER — Other Ambulatory Visit: Payer: Self-pay

## 2018-03-09 ENCOUNTER — Inpatient Hospital Stay: Payer: Medicare Other

## 2018-03-09 DIAGNOSIS — R413 Other amnesia: Secondary | ICD-10-CM | POA: Diagnosis not present

## 2018-03-09 DIAGNOSIS — R300 Dysuria: Secondary | ICD-10-CM | POA: Diagnosis not present

## 2018-03-09 DIAGNOSIS — D709 Neutropenia, unspecified: Secondary | ICD-10-CM | POA: Diagnosis not present

## 2018-03-09 DIAGNOSIS — D469 Myelodysplastic syndrome, unspecified: Secondary | ICD-10-CM | POA: Diagnosis not present

## 2018-03-09 DIAGNOSIS — D72819 Decreased white blood cell count, unspecified: Secondary | ICD-10-CM | POA: Diagnosis not present

## 2018-03-09 DIAGNOSIS — D696 Thrombocytopenia, unspecified: Secondary | ICD-10-CM | POA: Diagnosis not present

## 2018-03-09 LAB — CBC WITH DIFFERENTIAL/PLATELET
BASOS ABS: 0 10*3/uL (ref 0–0.1)
Basophils Relative: 1 %
Eosinophils Absolute: 0 10*3/uL (ref 0–0.7)
Eosinophils Relative: 0 %
HEMATOCRIT: 30.6 % — AB (ref 35.0–47.0)
HEMOGLOBIN: 10.7 g/dL — AB (ref 12.0–16.0)
Lymphocytes Relative: 65 %
Lymphs Abs: 1.1 10*3/uL (ref 1.0–3.6)
MCH: 30.4 pg (ref 26.0–34.0)
MCHC: 34.9 g/dL (ref 32.0–36.0)
MCV: 86.9 fL (ref 80.0–100.0)
Monocytes Absolute: 0.1 10*3/uL — ABNORMAL LOW (ref 0.2–0.9)
Monocytes Relative: 7 %
NEUTROS ABS: 0.5 10*3/uL — AB (ref 1.4–6.5)
NEUTROS PCT: 27 %
Platelets: 128 10*3/uL — ABNORMAL LOW (ref 150–440)
RBC: 3.52 MIL/uL — AB (ref 3.80–5.20)
RDW: 15.8 % — ABNORMAL HIGH (ref 11.5–14.5)
WBC: 1.7 10*3/uL — AB (ref 3.6–11.0)

## 2018-03-09 LAB — SAMPLE TO BLOOD BANK

## 2018-03-12 ENCOUNTER — Inpatient Hospital Stay: Payer: Medicare Other | Admitting: Oncology

## 2018-03-12 ENCOUNTER — Inpatient Hospital Stay: Payer: Medicare Other

## 2018-03-16 ENCOUNTER — Inpatient Hospital Stay: Payer: Medicare Other

## 2018-03-16 DIAGNOSIS — D709 Neutropenia, unspecified: Secondary | ICD-10-CM | POA: Diagnosis not present

## 2018-03-16 DIAGNOSIS — D72819 Decreased white blood cell count, unspecified: Secondary | ICD-10-CM | POA: Diagnosis not present

## 2018-03-16 DIAGNOSIS — R300 Dysuria: Secondary | ICD-10-CM | POA: Diagnosis not present

## 2018-03-16 DIAGNOSIS — D469 Myelodysplastic syndrome, unspecified: Secondary | ICD-10-CM | POA: Diagnosis not present

## 2018-03-16 DIAGNOSIS — R413 Other amnesia: Secondary | ICD-10-CM | POA: Diagnosis not present

## 2018-03-16 DIAGNOSIS — D696 Thrombocytopenia, unspecified: Secondary | ICD-10-CM | POA: Diagnosis not present

## 2018-03-16 LAB — CBC WITH DIFFERENTIAL/PLATELET
BASOS ABS: 0 10*3/uL (ref 0–0.1)
Basophils Relative: 1 %
Eosinophils Absolute: 0 10*3/uL (ref 0–0.7)
Eosinophils Relative: 0 %
HEMATOCRIT: 27.4 % — AB (ref 35.0–47.0)
HEMOGLOBIN: 9.5 g/dL — AB (ref 12.0–16.0)
LYMPHS ABS: 1 10*3/uL (ref 1.0–3.6)
LYMPHS PCT: 68 %
MCH: 30.2 pg (ref 26.0–34.0)
MCHC: 34.7 g/dL (ref 32.0–36.0)
MCV: 87 fL (ref 80.0–100.0)
MONO ABS: 0.1 10*3/uL — AB (ref 0.2–0.9)
Monocytes Relative: 4 %
NEUTROS ABS: 0.4 10*3/uL — AB (ref 1.4–6.5)
Neutrophils Relative %: 27 %
Platelets: 84 10*3/uL — ABNORMAL LOW (ref 150–440)
RBC: 3.15 MIL/uL — AB (ref 3.80–5.20)
RDW: 16.5 % — ABNORMAL HIGH (ref 11.5–14.5)
WBC: 1.5 10*3/uL — ABNORMAL LOW (ref 3.6–11.0)

## 2018-03-16 LAB — SAMPLE TO BLOOD BANK

## 2018-03-17 ENCOUNTER — Telehealth: Payer: Self-pay | Admitting: *Deleted

## 2018-03-17 NOTE — Telephone Encounter (Signed)
hgb 9.5.  Can come in for procrit only if they want, if not, reschedule for lab Monday,see me Thursday for blood/procrit.

## 2018-03-17 NOTE — Telephone Encounter (Signed)
Daughter would like to come at 40 Thursday for Procrit and then lab Monday and see doctor Thursday with possible blood/ procrit. Brooke, please make changes and call daughter with appts

## 2018-03-17 NOTE — Telephone Encounter (Signed)
Daughter called asking for yesterdays lab results and asking if she needs to keep appointment for transfusion this week. Please return call to [phone]

## 2018-03-19 ENCOUNTER — Inpatient Hospital Stay: Payer: Medicare Other | Attending: Oncology

## 2018-03-19 VITALS — BP 119/73 | HR 76 | Resp 18

## 2018-03-19 DIAGNOSIS — Z7982 Long term (current) use of aspirin: Secondary | ICD-10-CM | POA: Diagnosis not present

## 2018-03-19 DIAGNOSIS — R5382 Chronic fatigue, unspecified: Secondary | ICD-10-CM | POA: Diagnosis not present

## 2018-03-19 DIAGNOSIS — J449 Chronic obstructive pulmonary disease, unspecified: Secondary | ICD-10-CM | POA: Diagnosis not present

## 2018-03-19 DIAGNOSIS — D469 Myelodysplastic syndrome, unspecified: Secondary | ICD-10-CM | POA: Insufficient documentation

## 2018-03-19 DIAGNOSIS — F17210 Nicotine dependence, cigarettes, uncomplicated: Secondary | ICD-10-CM | POA: Insufficient documentation

## 2018-03-19 DIAGNOSIS — Z79899 Other long term (current) drug therapy: Secondary | ICD-10-CM | POA: Insufficient documentation

## 2018-03-19 DIAGNOSIS — I10 Essential (primary) hypertension: Secondary | ICD-10-CM | POA: Insufficient documentation

## 2018-03-19 DIAGNOSIS — Z853 Personal history of malignant neoplasm of breast: Secondary | ICD-10-CM | POA: Insufficient documentation

## 2018-03-19 DIAGNOSIS — D638 Anemia in other chronic diseases classified elsewhere: Secondary | ICD-10-CM

## 2018-03-19 DIAGNOSIS — D61818 Other pancytopenia: Secondary | ICD-10-CM | POA: Diagnosis not present

## 2018-03-19 MED ORDER — EPOETIN ALFA 40000 UNIT/ML IJ SOLN
40000.0000 [IU] | Freq: Once | INTRAMUSCULAR | Status: AC
  Administered 2018-03-19: 40000 [IU] via SUBCUTANEOUS
  Filled 2018-03-19: qty 1

## 2018-03-20 NOTE — Progress Notes (Signed)
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  INTERVAL HISTORY: Patient returns to clinic today for repeat laboratory work and further evaluation.  She continues to have chronic weakness and fatigue, but since reinitiating Procrit her hemoglobin is and performance status have mildly improved.  She otherwise feels well.  She has no neurologic complaints. She denies any recent fevers, chills, night sweats, or weight loss.  She denies any chest pain or shortness of breath. She has no nausea, vomiting, constipation, or diarrhea. She has no melena or hematochezia. She has no urinary complaints.  Patient offers no further specific complaints today.  REVIEW OF SYSTEMS:   Review of Systems  Constitutional: Positive for malaise/fatigue. Negative for fever and weight loss.  Eyes: Negative for blurred vision, double vision and pain.  Respiratory: Negative.  Negative for cough and shortness of breath.   Cardiovascular: Negative.  Negative for chest pain and leg swelling.  Gastrointestinal: Negative.  Negative for abdominal pain, blood in stool, constipation, diarrhea, melena, nausea and vomiting.  Genitourinary: Negative.   Musculoskeletal: Negative.  Negative for falls and joint pain.  Skin: Negative.  Negative for rash.  Neurological: Positive for weakness. Negative for dizziness and sensory change.  Psychiatric/Behavioral: Positive for memory loss. The patient is not nervous/anxious and does not have insomnia.     As per HPI. Otherwise, a complete review of systems is negative.  PAST MEDICAL HISTORY: Past Medical History:  Diagnosis Date  . Breast cancer ([place]    mastectomy  . COPD (chronic  obstructive pulmonary disease) ([place])   . Heart murmur   . Hiatal hernia   . Hypertension   . UTI (lower urinary tract infection)   . UTI (urinary tract infection)    frequent in past    PAST SURGICAL HISTORY: Past Surgical History:  Procedure Laterality Date  . ABDOMINAL HYSTERECTOMY    . BONE MARROW ASPIRATION  2018  . FOOT SURGERY     for hammertoe  . MASTECTOMY Left    modified radical in August 1996  . PORTACATH PLACEMENT N/A 12/09/2017   Procedure: INSERTION PORT-A-CATH;  Surgeon: Vickie Epley, MD;  Location: [facility] ORS;  Service: Vascular;  Laterality: N/A;    FAMILY HISTORY Family History  Problem Relation Age of Onset  . Cancer Brother        lung  . Hypertension Brother   . Anemia Brother   . Myelodysplastic syndrome Brother        ADVANCED DIRECTIVES:    HEALTH MAINTENANCE: Social History   Tobacco Use  . Smoking status: Current Some Day Smoker    Packs/day: 0.25    Types: Cigarettes  . Smokeless tobacco: Never Used  Substance Use Topics  . Alcohol use: No  . Drug use: No     Colonoscopy:  PAP:  Bone density:  Lipid panel:  Allergies  Allergen Reactions  . Nitrofurantoin Other (See Comments)  . Penicillins Itching    Has patient had a PCN reaction causing immediate rash, facial/tongue/throat swelling, SOB or lightheadedness with hypotension: Yes Has patient had a PCN reaction causing severe rash involving mucus membranes or skin necrosis: Unknown Has patient had a PCN reaction that required hospitalization: Unknown Has patient had a PCN reaction occurring within  the last 10 years: Yes If all of the above answers are "NO", then may proceed with Cephalosporin use.  . Sulfa Antibiotics Itching    Current Outpatient Medications  Medication Sig Dispense Refill  . acetaminophen (RA ACETAMINOPHEN) 650 MG CR tablet Take 650 mg by mouth every 8 (eight) hours as needed.     Marland Kitchen alendronate (FOSAMAX) 70 MG tablet Take 70 mg by mouth once a week.  Saturaday  0  . Ascorbic Acid (VITAMIN C) 1000 MG tablet Take 1,000 mg by mouth daily.    Marland Kitchen aspirin 81 MG tablet Take 81 mg by mouth daily.    . calcium-vitamin D (OSCAL WITH D) 250-125 MG-UNIT per tablet Take 1 tablet by mouth daily.    Marland Kitchen gabapentin (NEURONTIN) 100 MG capsule Take 1 capsule by mouth at bedtime.    . meclizine (ANTIVERT) 25 MG tablet Take 1 tablet (25 mg total) by mouth 3 (three) times daily as needed for dizziness. 30 tablet 0  . megestrol (MEGACE) 40 MG tablet Take 1 tablet (40 mg total) by mouth daily. (Patient taking differently: Take 20 mg by mouth daily. ) 30 tablet 2  . Multiple Minerals (JOINT HEALTH MINERAL PO) Take 1 tablet by mouth daily.    . Multiple Vitamin (MULTIVITAMIN) tablet Take 1 tablet by mouth daily.    . psyllium (REGULOID) 0.52 g capsule Take 0.52 g by mouth daily. Metamucil    . sodium chloride (OCEAN) 0.65 % SOLN nasal spray Place 1 spray into both nostrils as needed for congestion.    . valsartan-hydrochlorothiazide (DIOVAN-HCT) 80-12.5 MG tablet      No current facility-administered medications for this visit.    Facility-Administered Medications Ordered in Other Visits  Medication Dose Route Frequency Provider Last Rate Last Dose  . epoetin alfa (EPOGEN,PROCRIT) injection 40,000 Units  40,000 Units Subcutaneous Once [name], MD      . sodium chloride flush (NS) 0.9 % injection 10 mL  10 mL Intravenous PRN [name], MD   10 mL at 01/29/18 0839    OBJECTIVE: Vitals:   03/23/18 0921  BP: (!) 146/67  Pulse: 74  Resp: 20  Temp: (!) 96.5 F (35.8 C)     Body mass index is 18.51 kg/m.    ECOG FS:2 - Symptomatic, <50% confined to bed  General: Thin, no acute distress.  Sitting in a wheelchair.   Eyes: Pink conjunctiva, anicteric sclera. Lungs: Clear to auscultation bilaterally. Heart: Regular rate and rhythm. No rubs, murmurs, or gallops. Abdomen: Soft, nontender, nondistended. No organomegaly noted, normoactive bowel  sounds. Musculoskeletal: No edema, cyanosis, or clubbing. Neuro: Alert, answering all questions appropriately. Cranial nerves grossly intact. Skin: No rashes or petechiae noted. Psych: Normal affect.   LAB RESULTS:  Lab Results  Component Value Date   NA 136 03/02/2018   K 4.1 03/02/2018   CL 107 03/02/2018   CO2 21 (L) 03/02/2018   GLUCOSE 117 (H) 03/02/2018   BUN 37 (H) 03/02/2018   CREATININE 1.16 (H) 03/02/2018   CALCIUM 9.6 03/02/2018   PROT 6.9 03/02/2018   ALBUMIN 3.8 03/02/2018   AST 23 03/02/2018   ALT 21 03/02/2018   ALKPHOS 33 (L) 03/02/2018   BILITOT 0.9 03/02/2018   GFRNONAA 41 (L) 03/02/2018   GFRAA 47 (L) 03/02/2018    Lab Results  Component Value Date   WBC 1.6 (L) 03/23/2018   NEUTROABS 0.3 (L) 03/23/2018   HGB 9.2 (L) 03/23/2018   HCT 26.3 (L) 03/23/2018  MCV 87.9 03/23/2018   PLT 95 (L) 03/23/2018   Lab Results  Component Value Date   FERRITIN 595 (H) 10/01/2017   Lab Results  Component Value Date   IRON 214 (H) 10/01/2017   TIBC 256 10/01/2017   IRONPCTSAT 83 (H) 10/01/2017      STUDIES: Dg Ribs Unilateral Left  Result Date: 02/23/2018 CLINICAL DATA:  Rib pain EXAM: LEFT RIBS - 2 VIEW COMPARISON:  Chest x-ray 01/01/2018 FINDINGS: No fracture or other bone lesions are seen involving the ribs. Degenerative changes at the left shoulder with probable rotator cuff disease. IMPRESSION: Negative. Degenerative changes at the left shoulder with suspected rotator cuff disease. Electronically Signed   By: Donavan Foil M.D.   On: 02/23/2018 15:00   Dg Ribs Unilateral Right  Result Date: 02/23/2018 CLINICAL DATA:  Right rib pain EXAM: RIGHT RIBS - 2 VIEW COMPARISON:  Chest x-ray 01/01/2018, CT chest 06/24/2018 FINDINGS: Right rib series demonstrates a right-sided port with tip over the SVC. Right apical pleural and parenchymal scarring. Rotator cuff disease at the right shoulder with degenerative changes at the [facility] joint and glenohumeral interval. No  acute displaced right rib fracture. IMPRESSION: Negative. Electronically Signed   By: Donavan Foil M.D.   On: 02/23/2018 14:58   Dg Lumbar Spine Complete  Result Date: 02/23/2018 CLINICAL DATA:  Low back pain for 2 weeks EXAM: LUMBAR SPINE - COMPLETE 4+ VIEW COMPARISON:  05/27/2014 FINDINGS: Minimal scoliosis of the spine. Trace retrolisthesis of L3 on L4. Marked disc space narrowing with endplate sclerosis and osteophyte at L2-L3, L3-L4 and L5-S1, slight progression since 2015, particularly at L3-L4. Posterior facet degenerative changes L3 through S1. Dense aortic atherosclerosis. IMPRESSION: 1. Mild scoliosis of the spine with advanced degenerative changes L2 through L4 and L5-S1. 2. No acute osseous abnormality Electronically Signed   By: Donavan Foil M.D.   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  PLAN:    1. 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  Patient also noted increased blast count, but unclear how much.  She also was noted to have an increase in clonal plasma cells possibly indicating an additional plasma cell dyscrasia.  Patient's peripheral smear was reviewed by pathology did not reveal any blasts or myeloblasts.  End-of-life and hospice care was previously discussed, but patient is not ready to pursue this option.  Patient has developed antibodies in her blood making it difficult to find a match.  She does not require transfusion this week.  We will proceed with 40,000 using Procrit.  Return to clinic weekly for lab and Procrit if her hemoglobin remains below 10.0.  Patient will then return to clinic in 4 weeks for further evaluation.   2.  History of breast cancer: No evidence of disease.  Patient's mammograms are ordered by her PCP. 3.  Hypertension: Patient's blood pressure is mildly elevated today.  Continue treatment per PCP. 4.  Leukopenia/neutropenia: Chronic and relatively unchanged.  Patient's white blood cell count is approximately her baseline.  Monitor. 5.  Thrombocytopenia: Platelet count remains decreased, but essentially stable.  Monitor. 6.  Elevated iron stores: Continue Desferal with transfusions.  Will draw a repeat iron stores with next blood draw.  Approximately 30 minutes was spent in discussion of which greater than 50% was consultation.  Patient expressed understanding and was in agreement  with this plan. She also understands that She can call clinic at any time with any questions, concerns, or complaints.    [name], MD 03/23/18 9:49 AM

## 2018-03-23 ENCOUNTER — Encounter: Payer: Self-pay | Admitting: Oncology

## 2018-03-23 ENCOUNTER — Inpatient Hospital Stay: Payer: Medicare Other

## 2018-03-23 ENCOUNTER — Inpatient Hospital Stay (HOSPITAL_BASED_OUTPATIENT_CLINIC_OR_DEPARTMENT_OTHER): Payer: Medicare Other | Admitting: Oncology

## 2018-03-23 VITALS — BP 146/67 | HR 74 | Temp 96.5°F | Resp 20 | Wt 118.2 lb

## 2018-03-23 DIAGNOSIS — D469 Myelodysplastic syndrome, unspecified: Secondary | ICD-10-CM | POA: Diagnosis not present

## 2018-03-23 DIAGNOSIS — R5382 Chronic fatigue, unspecified: Secondary | ICD-10-CM | POA: Diagnosis not present

## 2018-03-23 DIAGNOSIS — I10 Essential (primary) hypertension: Secondary | ICD-10-CM

## 2018-03-23 DIAGNOSIS — J449 Chronic obstructive pulmonary disease, unspecified: Secondary | ICD-10-CM

## 2018-03-23 DIAGNOSIS — D61818 Other pancytopenia: Secondary | ICD-10-CM

## 2018-03-23 DIAGNOSIS — Z7982 Long term (current) use of aspirin: Secondary | ICD-10-CM | POA: Diagnosis not present

## 2018-03-23 DIAGNOSIS — F17210 Nicotine dependence, cigarettes, uncomplicated: Secondary | ICD-10-CM

## 2018-03-23 DIAGNOSIS — D638 Anemia in other chronic diseases classified elsewhere: Secondary | ICD-10-CM

## 2018-03-23 DIAGNOSIS — Z853 Personal history of malignant neoplasm of breast: Secondary | ICD-10-CM | POA: Diagnosis not present

## 2018-03-23 DIAGNOSIS — Z79899 Other long term (current) drug therapy: Secondary | ICD-10-CM

## 2018-03-23 LAB — SAMPLE TO BLOOD BANK

## 2018-03-23 MED ORDER — EPOETIN ALFA 40000 UNIT/ML IJ SOLN
40000.0000 [IU] | Freq: Once | INTRAMUSCULAR | Status: AC
  Administered 2018-03-23: 40000 [IU] via SUBCUTANEOUS
  Filled 2018-03-23: qty 1

## 2018-03-23 NOTE — Progress Notes (Signed)
Procrit interval order is entered as every 4 weeks.  MD approves patient to receive Procrit today with last inj given 4 days ago (03/19/18)

## 2018-03-23 NOTE — Progress Notes (Signed)
Patient here today for follow up regarding anemia. Patient reports improvement in appetite since starting Megace. Patients daughter reports increase in memory loss.

## 2018-03-24 LAB — CBC WITH DIFFERENTIAL/PLATELET
BASOS ABS: 0 10*3/uL (ref 0–0.1)
BASOS PCT: 2 %
EOS PCT: 0 %
Eosinophils Absolute: 0 10*3/uL (ref 0–0.7)
HEMATOCRIT: 26.3 % — AB (ref 35.0–47.0)
HEMOGLOBIN: 9.2 g/dL — AB (ref 12.0–16.0)
LYMPHS ABS: 1.2 10*3/uL (ref 1.0–3.6)
Lymphocytes Relative: 72 %
MCH: 30.7 pg (ref 26.0–34.0)
MCHC: 34.9 g/dL (ref 32.0–36.0)
MCV: 87.9 fL (ref 80.0–100.0)
Monocytes Absolute: 0.1 10*3/uL — ABNORMAL LOW (ref 0.2–0.9)
Monocytes Relative: 9 %
Neutro Abs: 0.3 10*3/uL — ABNORMAL LOW (ref 1.7–7.7)
Neutrophils Relative %: 17 %
Platelets: 95 10*3/uL — ABNORMAL LOW (ref 150–440)
RBC: 2.99 MIL/uL — ABNORMAL LOW (ref 3.80–5.20)
RDW: 16.3 % — ABNORMAL HIGH (ref 11.5–14.5)
WBC: 1.6 10*3/uL — ABNORMAL LOW (ref 4.0–10.5)

## 2018-03-26 ENCOUNTER — Ambulatory Visit: Payer: Self-pay

## 2018-03-30 ENCOUNTER — Inpatient Hospital Stay: Payer: Medicare Other

## 2018-03-30 ENCOUNTER — Other Ambulatory Visit: Payer: Self-pay | Admitting: *Deleted

## 2018-03-30 ENCOUNTER — Other Ambulatory Visit: Payer: Self-pay | Admitting: Oncology

## 2018-03-30 VITALS — BP 116/59

## 2018-03-30 DIAGNOSIS — D469 Myelodysplastic syndrome, unspecified: Secondary | ICD-10-CM

## 2018-03-30 DIAGNOSIS — Z853 Personal history of malignant neoplasm of breast: Secondary | ICD-10-CM | POA: Diagnosis not present

## 2018-03-30 DIAGNOSIS — R5382 Chronic fatigue, unspecified: Secondary | ICD-10-CM | POA: Diagnosis not present

## 2018-03-30 DIAGNOSIS — D61818 Other pancytopenia: Secondary | ICD-10-CM | POA: Diagnosis not present

## 2018-03-30 DIAGNOSIS — D638 Anemia in other chronic diseases classified elsewhere: Secondary | ICD-10-CM

## 2018-03-30 DIAGNOSIS — F17210 Nicotine dependence, cigarettes, uncomplicated: Secondary | ICD-10-CM | POA: Diagnosis not present

## 2018-03-30 LAB — CBC WITH DIFFERENTIAL/PLATELET
BASOS ABS: 0 10*3/uL (ref 0–0.1)
Basophils Relative: 1 %
Eosinophils Absolute: 0 10*3/uL (ref 0–0.7)
Eosinophils Relative: 0 %
HEMATOCRIT: 23.2 % — AB (ref 35.0–47.0)
HEMOGLOBIN: 8.1 g/dL — AB (ref 12.0–16.0)
Lymphocytes Relative: 71 %
Lymphs Abs: 0.9 10*3/uL — ABNORMAL LOW (ref 1.0–3.6)
MCH: 30.8 pg (ref 26.0–34.0)
MCHC: 35.1 g/dL (ref 32.0–36.0)
MCV: 87.8 fL (ref 80.0–100.0)
Monocytes Absolute: 0.1 10*3/uL — ABNORMAL LOW (ref 0.2–0.9)
Monocytes Relative: 8 %
NEUTROS ABS: 0.2 10*3/uL — AB (ref 1.4–6.5)
NEUTROS PCT: 20 %
PLATELETS: 157 10*3/uL (ref 150–440)
RBC: 2.64 MIL/uL — ABNORMAL LOW (ref 3.80–5.20)
RDW: 16.8 % — ABNORMAL HIGH (ref 11.5–14.5)
WBC: 1.2 10*3/uL — CL (ref 3.6–11.0)

## 2018-03-30 LAB — SAMPLE TO BLOOD BANK

## 2018-03-30 LAB — IRON AND TIBC
IRON: 190 ug/dL — AB (ref 28–170)
Saturation Ratios: 90 % — ABNORMAL HIGH (ref 10.4–31.8)
TIBC: 212 ug/dL — AB (ref 250–450)
UIBC: 22 ug/dL

## 2018-03-30 LAB — PREPARE RBC (CROSSMATCH)

## 2018-03-30 LAB — FERRITIN: Ferritin: 1733 ng/mL — ABNORMAL HIGH (ref 11–307)

## 2018-03-30 MED ORDER — EPOETIN ALFA 40000 UNIT/ML IJ SOLN
40000.0000 [IU] | Freq: Once | INTRAMUSCULAR | Status: AC
  Administered 2018-03-30: 40000 [IU] via SUBCUTANEOUS

## 2018-03-30 MED ORDER — HEPARIN SOD (PORK) LOCK FLUSH 100 UNIT/ML IV SOLN
500.0000 [IU] | Freq: Once | INTRAVENOUS | Status: AC
  Administered 2018-03-30: 500 [IU] via INTRAVENOUS

## 2018-03-30 MED ORDER — SODIUM CHLORIDE 0.9% FLUSH
10.0000 mL | INTRAVENOUS | Status: AC | PRN
  Administered 2018-03-30: 10 mL via INTRAVENOUS
  Filled 2018-03-30: qty 10

## 2018-04-02 ENCOUNTER — Inpatient Hospital Stay: Payer: Medicare Other

## 2018-04-02 DIAGNOSIS — D469 Myelodysplastic syndrome, unspecified: Secondary | ICD-10-CM | POA: Diagnosis not present

## 2018-04-02 DIAGNOSIS — R5382 Chronic fatigue, unspecified: Secondary | ICD-10-CM | POA: Diagnosis not present

## 2018-04-02 DIAGNOSIS — D61818 Other pancytopenia: Secondary | ICD-10-CM | POA: Diagnosis not present

## 2018-04-02 DIAGNOSIS — F17210 Nicotine dependence, cigarettes, uncomplicated: Secondary | ICD-10-CM | POA: Diagnosis not present

## 2018-04-02 DIAGNOSIS — Z853 Personal history of malignant neoplasm of breast: Secondary | ICD-10-CM | POA: Diagnosis not present

## 2018-04-02 MED ORDER — SODIUM CHLORIDE 0.9 % IV SOLN
15.0000 mg/kg/h | Freq: Once | INTRAVENOUS | Status: AC
  Administered 2018-04-02: 15 mg/kg/h via INTRAVENOUS
  Filled 2018-04-02: qty 2

## 2018-04-02 MED ORDER — SODIUM CHLORIDE 0.9% FLUSH
10.0000 mL | INTRAVENOUS | Status: AC | PRN
  Administered 2018-04-02: 10 mL
  Filled 2018-04-02: qty 10

## 2018-04-02 MED ORDER — HEPARIN SOD (PORK) LOCK FLUSH 100 UNIT/ML IV SOLN
500.0000 [IU] | Freq: Every day | INTRAVENOUS | Status: AC | PRN
  Administered 2018-04-02: 500 [IU]
  Filled 2018-04-02: qty 5

## 2018-04-02 MED ORDER — ACETAMINOPHEN 325 MG PO TABS
650.0000 mg | ORAL_TABLET | Freq: Once | ORAL | Status: AC
  Administered 2018-04-02: 650 mg via ORAL
  Filled 2018-04-02: qty 2

## 2018-04-02 MED ORDER — SODIUM CHLORIDE 0.9 % IV SOLN
250.0000 mL | Freq: Once | INTRAVENOUS | Status: AC
  Administered 2018-04-02: 250 mL via INTRAVENOUS
  Filled 2018-04-02: qty 250

## 2018-04-02 MED ORDER — DIPHENHYDRAMINE HCL 50 MG/ML IJ SOLN
25.0000 mg | Freq: Once | INTRAMUSCULAR | Status: AC
  Administered 2018-04-02: 25 mg via INTRAVENOUS
  Filled 2018-04-02: qty 1

## 2018-04-03 LAB — TYPE AND SCREEN
ABO/RH(D): O POS
Antibody Screen: POSITIVE
DAT, IGG: POSITIVE
DAT, complement: NEGATIVE
Donor AG Type: NEGATIVE
UNIT DIVISION: 0

## 2018-04-03 LAB — BPAM RBC
BLOOD PRODUCT EXPIRATION DATE: 201906012359
ISSUE DATE / TIME: 201905161214
UNIT TYPE AND RH: 5100

## 2018-04-06 ENCOUNTER — Inpatient Hospital Stay: Payer: Medicare Other

## 2018-04-06 VITALS — BP 134/73 | HR 68 | Resp 20

## 2018-04-06 DIAGNOSIS — D469 Myelodysplastic syndrome, unspecified: Secondary | ICD-10-CM

## 2018-04-06 DIAGNOSIS — D638 Anemia in other chronic diseases classified elsewhere: Secondary | ICD-10-CM

## 2018-04-06 DIAGNOSIS — D61818 Other pancytopenia: Secondary | ICD-10-CM | POA: Diagnosis not present

## 2018-04-06 DIAGNOSIS — R5382 Chronic fatigue, unspecified: Secondary | ICD-10-CM | POA: Diagnosis not present

## 2018-04-06 DIAGNOSIS — F17210 Nicotine dependence, cigarettes, uncomplicated: Secondary | ICD-10-CM | POA: Diagnosis not present

## 2018-04-06 DIAGNOSIS — Z853 Personal history of malignant neoplasm of breast: Secondary | ICD-10-CM | POA: Diagnosis not present

## 2018-04-06 LAB — CBC WITH DIFFERENTIAL/PLATELET
BASOS ABS: 0 10*3/uL (ref 0–0.1)
Basophils Relative: 2 %
Eosinophils Absolute: 0 10*3/uL (ref 0–0.7)
Eosinophils Relative: 0 %
HCT: 27.8 % — ABNORMAL LOW (ref 35.0–47.0)
Hemoglobin: 9.7 g/dL — ABNORMAL LOW (ref 12.0–16.0)
Lymphocytes Relative: 69 %
Lymphs Abs: 0.7 10*3/uL — ABNORMAL LOW (ref 1.0–3.6)
MCH: 31.2 pg (ref 26.0–34.0)
MCHC: 35 g/dL (ref 32.0–36.0)
MCV: 89.2 fL (ref 80.0–100.0)
MONOS PCT: 11 %
Monocytes Absolute: 0.1 10*3/uL — ABNORMAL LOW (ref 0.2–0.9)
NEUTROS ABS: 0.2 10*3/uL — AB (ref 1.4–6.5)
NEUTROS PCT: 18 %
PLATELETS: 80 10*3/uL — AB (ref 150–440)
RBC: 3.12 MIL/uL — AB (ref 3.80–5.20)
RDW: 17 % — ABNORMAL HIGH (ref 11.5–14.5)
WBC: 1 10*3/uL — AB (ref 3.6–11.0)

## 2018-04-06 LAB — SAMPLE TO BLOOD BANK

## 2018-04-06 MED ORDER — EPOETIN ALFA 40000 UNIT/ML IJ SOLN
40000.0000 [IU] | Freq: Once | INTRAMUSCULAR | Status: AC
  Administered 2018-04-06: 40000 [IU] via SUBCUTANEOUS

## 2018-04-06 MED ORDER — HEPARIN SOD (PORK) LOCK FLUSH 100 UNIT/ML IV SOLN
500.0000 [IU] | Freq: Once | INTRAVENOUS | Status: AC
  Administered 2018-04-06: 500 [IU] via INTRAVENOUS

## 2018-04-06 MED ORDER — SODIUM CHLORIDE 0.9% FLUSH
10.0000 mL | Freq: Once | INTRAVENOUS | Status: AC
  Administered 2018-04-06: 10 mL via INTRAVENOUS
  Filled 2018-04-06: qty 10

## 2018-04-12 ENCOUNTER — Other Ambulatory Visit: Payer: Self-pay

## 2018-04-12 ENCOUNTER — Inpatient Hospital Stay
Admission: EM | Admit: 2018-04-12 | Discharge: 2018-04-20 | DRG: 314 | Disposition: A | Discharge status: Hospice | Payer: Medicare Other | Attending: Internal Medicine | Admitting: Internal Medicine

## 2018-04-12 ENCOUNTER — Emergency Department: Payer: Medicare Other

## 2018-04-12 DIAGNOSIS — T80219A Unspecified infection due to central venous catheter, initial encounter: Secondary | ICD-10-CM | POA: Diagnosis not present

## 2018-04-12 DIAGNOSIS — Y832 Surgical operation with anastomosis, bypass or graft as the cause of abnormal reaction of the patient, or of later complication, without mention of misadventure at the time of the procedure: Secondary | ICD-10-CM | POA: Diagnosis present

## 2018-04-12 DIAGNOSIS — F17210 Nicotine dependence, cigarettes, uncomplicated: Secondary | ICD-10-CM | POA: Diagnosis present

## 2018-04-12 DIAGNOSIS — Z7401 Bed confinement status: Secondary | ICD-10-CM | POA: Diagnosis not present

## 2018-04-12 DIAGNOSIS — R5383 Other fatigue: Secondary | ICD-10-CM | POA: Diagnosis not present

## 2018-04-12 DIAGNOSIS — R531 Weakness: Secondary | ICD-10-CM | POA: Diagnosis not present

## 2018-04-12 DIAGNOSIS — Z9012 Acquired absence of left breast and nipple: Secondary | ICD-10-CM | POA: Diagnosis not present

## 2018-04-12 DIAGNOSIS — M25552 Pain in left hip: Secondary | ICD-10-CM | POA: Diagnosis not present

## 2018-04-12 DIAGNOSIS — I4891 Unspecified atrial fibrillation: Secondary | ICD-10-CM | POA: Diagnosis present

## 2018-04-12 DIAGNOSIS — Z452 Encounter for adjustment and management of vascular access device: Secondary | ICD-10-CM | POA: Diagnosis not present

## 2018-04-12 DIAGNOSIS — R7881 Bacteremia: Secondary | ICD-10-CM

## 2018-04-12 DIAGNOSIS — Z79899 Other long term (current) drug therapy: Secondary | ICD-10-CM | POA: Diagnosis not present

## 2018-04-12 DIAGNOSIS — J9811 Atelectasis: Secondary | ICD-10-CM | POA: Diagnosis present

## 2018-04-12 DIAGNOSIS — Z7982 Long term (current) use of aspirin: Secondary | ICD-10-CM | POA: Diagnosis not present

## 2018-04-12 DIAGNOSIS — T80211A Bloodstream infection due to central venous catheter, initial encounter: Secondary | ICD-10-CM | POA: Diagnosis not present

## 2018-04-12 DIAGNOSIS — Z853 Personal history of malignant neoplasm of breast: Secondary | ICD-10-CM | POA: Diagnosis not present

## 2018-04-12 DIAGNOSIS — D469 Myelodysplastic syndrome, unspecified: Secondary | ICD-10-CM | POA: Diagnosis not present

## 2018-04-12 DIAGNOSIS — F0390 Unspecified dementia without behavioral disturbance: Secondary | ICD-10-CM | POA: Diagnosis present

## 2018-04-12 DIAGNOSIS — N179 Acute kidney failure, unspecified: Secondary | ICD-10-CM | POA: Diagnosis present

## 2018-04-12 DIAGNOSIS — N390 Urinary tract infection, site not specified: Secondary | ICD-10-CM | POA: Diagnosis present

## 2018-04-12 DIAGNOSIS — R6521 Severe sepsis with septic shock: Secondary | ICD-10-CM | POA: Diagnosis present

## 2018-04-12 DIAGNOSIS — E872 Acidosis: Secondary | ICD-10-CM | POA: Diagnosis present

## 2018-04-12 DIAGNOSIS — R52 Pain, unspecified: Secondary | ICD-10-CM

## 2018-04-12 DIAGNOSIS — Z87891 Personal history of nicotine dependence: Secondary | ICD-10-CM | POA: Diagnosis not present

## 2018-04-12 DIAGNOSIS — I248 Other forms of acute ischemic heart disease: Secondary | ICD-10-CM | POA: Diagnosis present

## 2018-04-12 DIAGNOSIS — I351 Nonrheumatic aortic (valve) insufficiency: Secondary | ICD-10-CM | POA: Diagnosis not present

## 2018-04-12 DIAGNOSIS — I499 Cardiac arrhythmia, unspecified: Secondary | ICD-10-CM | POA: Diagnosis not present

## 2018-04-12 DIAGNOSIS — A4101 Sepsis due to Methicillin susceptible Staphylococcus aureus: Secondary | ICD-10-CM | POA: Diagnosis not present

## 2018-04-12 DIAGNOSIS — R4182 Altered mental status, unspecified: Secondary | ICD-10-CM | POA: Diagnosis not present

## 2018-04-12 DIAGNOSIS — J90 Pleural effusion, not elsewhere classified: Secondary | ICD-10-CM | POA: Diagnosis present

## 2018-04-12 DIAGNOSIS — Z9981 Dependence on supplemental oxygen: Secondary | ICD-10-CM | POA: Diagnosis not present

## 2018-04-12 DIAGNOSIS — Z87440 Personal history of urinary (tract) infections: Secondary | ICD-10-CM | POA: Diagnosis not present

## 2018-04-12 DIAGNOSIS — Z515 Encounter for palliative care: Secondary | ICD-10-CM | POA: Diagnosis present

## 2018-04-12 DIAGNOSIS — A419 Sepsis, unspecified organism: Secondary | ICD-10-CM

## 2018-04-12 DIAGNOSIS — R0602 Shortness of breath: Secondary | ICD-10-CM

## 2018-04-12 DIAGNOSIS — R404 Transient alteration of awareness: Secondary | ICD-10-CM | POA: Diagnosis not present

## 2018-04-12 DIAGNOSIS — G9349 Other encephalopathy: Secondary | ICD-10-CM | POA: Diagnosis present

## 2018-04-12 DIAGNOSIS — F0150 Vascular dementia without behavioral disturbance: Secondary | ICD-10-CM | POA: Diagnosis not present

## 2018-04-12 DIAGNOSIS — I10 Essential (primary) hypertension: Secondary | ICD-10-CM | POA: Diagnosis present

## 2018-04-12 DIAGNOSIS — I214 Non-ST elevation (NSTEMI) myocardial infarction: Secondary | ICD-10-CM | POA: Diagnosis not present

## 2018-04-12 DIAGNOSIS — Z66 Do not resuscitate: Secondary | ICD-10-CM | POA: Diagnosis present

## 2018-04-12 DIAGNOSIS — Z466 Encounter for fitting and adjustment of urinary device: Secondary | ICD-10-CM | POA: Diagnosis not present

## 2018-04-12 DIAGNOSIS — M1991 Primary osteoarthritis, unspecified site: Secondary | ICD-10-CM | POA: Diagnosis not present

## 2018-04-12 DIAGNOSIS — E860 Dehydration: Secondary | ICD-10-CM | POA: Diagnosis present

## 2018-04-12 DIAGNOSIS — J449 Chronic obstructive pulmonary disease, unspecified: Secondary | ICD-10-CM | POA: Diagnosis present

## 2018-04-12 DIAGNOSIS — R509 Fever, unspecified: Secondary | ICD-10-CM | POA: Diagnosis not present

## 2018-04-12 LAB — CBC WITH DIFFERENTIAL/PLATELET
BASOS ABS: 0 10*3/uL (ref 0–0.1)
Basophils Relative: 0 %
EOS ABS: 0 10*3/uL (ref 0–0.7)
Eosinophils Relative: 0 %
HCT: 28.2 % — ABNORMAL LOW (ref 35.0–47.0)
HEMOGLOBIN: 9.9 g/dL — AB (ref 12.0–16.0)
LYMPHS PCT: 5 %
Lymphs Abs: 0.4 10*3/uL — ABNORMAL LOW (ref 1.0–3.6)
MCH: 31.4 pg (ref 26.0–34.0)
MCHC: 35 g/dL (ref 32.0–36.0)
MCV: 89.7 fL (ref 80.0–100.0)
MONO ABS: 0.2 10*3/uL (ref 0.2–0.9)
Monocytes Relative: 2 %
NEUTROS ABS: 8.2 10*3/uL — AB (ref 1.4–6.5)
Neutrophils Relative %: 93 %
PLATELETS: 101 10*3/uL — AB (ref 150–440)
RBC: 3.14 MIL/uL — ABNORMAL LOW (ref 3.80–5.20)
RDW: 17.2 % — AB (ref 11.5–14.5)
WBC: 8.8 10*3/uL (ref 3.6–11.0)

## 2018-04-12 LAB — URINALYSIS, ROUTINE W REFLEX MICROSCOPIC
Bilirubin Urine: NEGATIVE
GLUCOSE, UA: NEGATIVE mg/dL
Ketones, ur: NEGATIVE mg/dL
NITRITE: NEGATIVE
PH: 7 (ref 5.0–8.0)
Protein, ur: 100 mg/dL — AB
SPECIFIC GRAVITY, URINE: 1.018 (ref 1.005–1.030)
Squamous Epithelial / LPF: NONE SEEN (ref 0–5)

## 2018-04-12 LAB — COMPREHENSIVE METABOLIC PANEL
ALBUMIN: 3.8 g/dL (ref 3.5–5.0)
ALK PHOS: 39 U/L (ref 38–126)
ALT: 20 U/L (ref 14–54)
AST: 37 U/L (ref 15–41)
Anion gap: 9 (ref 5–15)
BUN: 44 mg/dL — ABNORMAL HIGH (ref 6–20)
CALCIUM: 9.7 mg/dL (ref 8.9–10.3)
CO2: 18 mmol/L — ABNORMAL LOW (ref 22–32)
CREATININE: 1.28 mg/dL — AB (ref 0.44–1.00)
Chloride: 104 mmol/L (ref 101–111)
GFR calc Af Amer: 42 mL/min — ABNORMAL LOW (ref >60)
GFR, EST NON AFRICAN AMERICAN: 36 mL/min — AB (ref >60)
GLUCOSE: 162 mg/dL — AB (ref 65–99)
POTASSIUM: 4.4 mmol/L (ref 3.5–5.1)
Sodium: 131 mmol/L — ABNORMAL LOW (ref 135–145)
TOTAL PROTEIN: 6.8 g/dL (ref 6.5–8.1)
Total Bilirubin: 1.6 mg/dL — ABNORMAL HIGH (ref 0.3–1.2)

## 2018-04-12 LAB — LACTIC ACID, PLASMA: Lactic Acid, Venous: 3.9 mmol/L (ref 0.5–1.9)

## 2018-04-12 LAB — TROPONIN I: Troponin I: 0.37 ng/mL (ref <0.03)

## 2018-04-12 MED ORDER — ACETAMINOPHEN 325 MG PO TABS
650.0000 mg | ORAL_TABLET | Freq: Four times a day (QID) | ORAL | Status: DC | PRN
  Administered 2018-04-13 – 2018-04-14 (×2): 650 mg via ORAL
  Filled 2018-04-12 (×2): qty 2

## 2018-04-12 MED ORDER — ACETAMINOPHEN 650 MG RE SUPP
RECTAL | Status: AC
  Administered 2018-04-12: 650 mg via RECTAL
  Filled 2018-04-12: qty 1

## 2018-04-12 MED ORDER — SODIUM CHLORIDE 0.9 % IV BOLUS
1000.0000 mL | Freq: Once | INTRAVENOUS | Status: AC
  Administered 2018-04-12: 1000 mL via INTRAVENOUS

## 2018-04-12 MED ORDER — ACETAMINOPHEN 650 MG RE SUPP
650.0000 mg | Freq: Four times a day (QID) | RECTAL | Status: DC | PRN
  Administered 2018-04-12: 650 mg via RECTAL

## 2018-04-12 MED ORDER — SODIUM CHLORIDE 0.9 % IV SOLN
2.0000 g | INTRAVENOUS | Status: DC
  Administered 2018-04-13: 02:00:00 2 g via INTRAVENOUS
  Filled 2018-04-12 (×2): qty 2

## 2018-04-12 MED ORDER — VANCOMYCIN HCL IN DEXTROSE 1-5 GM/200ML-% IV SOLN
1000.0000 mg | Freq: Once | INTRAVENOUS | Status: AC
  Administered 2018-04-12: 1000 mg via INTRAVENOUS
  Filled 2018-04-12: qty 200

## 2018-04-12 MED ORDER — AZTREONAM 2 G IJ SOLR
2.0000 g | Freq: Once | INTRAMUSCULAR | Status: AC
  Administered 2018-04-12: 2 g via INTRAVENOUS
  Filled 2018-04-12: qty 2

## 2018-04-12 MED ORDER — LEVOFLOXACIN IN D5W 750 MG/150ML IV SOLN
750.0000 mg | Freq: Once | INTRAVENOUS | Status: AC
  Administered 2018-04-12: 750 mg via INTRAVENOUS
  Filled 2018-04-12: qty 150

## 2018-04-12 MED ORDER — VANCOMYCIN HCL IN DEXTROSE 750-5 MG/150ML-% IV SOLN
750.0000 mg | INTRAVENOUS | Status: DC
  Filled 2018-04-12: qty 150

## 2018-04-12 MED ORDER — VANCOMYCIN HCL IN DEXTROSE 750-5 MG/150ML-% IV SOLN: 750.0000 mg | INTRAVENOUS | Status: DC

## 2018-04-12 NOTE — ED Triage Notes (Signed)
Pt brought in by [facility] from home with possible UTI, increased lethargy.  Pt is at baseline mentality.  Pt is demented and unable to answer any questions at this time.  EDP aware.

## 2018-04-12 NOTE — H&P (Signed)
[place] [facility] [place] NAME: Chelsea Horne    MR#:  [id number]  DATE OF BIRTH:  1927/06/21  DATE OF ADMISSION:  04/12/2018  PRIMARY CARE PHYSICIAN: Cletis Athens, MD   REQUESTING/REFERRING PHYSICIAN:   CHIEF COMPLAINT:   Chief Complaint  Patient presents with  . Urinary Tract Infection    HISTORY OF PRESENT ILLNESS: Chelsea Horne  is a 84 y.o. female with a known history of COPD, hypertension, recurrent urinary tract infections and breast cancer status post remote mastectomy. Patient is unable to provide history due to lethargy and confusion.  Most of the information is obtained from reviewing the medical chart and from discussion with emergency room physician and the family. Patient was brought to emergency room for acute mental status change with increased lethargy and generalized weakness.  She was also noticed to be febrile at home. At the arrival to emergency room, patient was noted to have a temperature of 103.2.  Blood pressure was low at 90/53.  Heart rate was elevated in 120s.  BP improved with IV fluids, the patient continues to remain tachycardic and febrile. Blood test done in the emergency room reveal elevated lactic acid at 3.9.  Sodium level is 131.  Level is 1.28.  Troponin level is 0.37.  UA is positive for UTI.  X-ray, reviewed by myself is negative for any acute cardiopulmonary disease. EKG, reviewed by myself, shows sinus tachycardia with heart rate of 129; normal axis, normal intervals and nonspecific ST changes, but no ST elevation. Patient is admitted for further evaluation and treatment.   PAST MEDICAL HISTORY:   Past Medical History:  Diagnosis Date  . Breast cancer ([place])    mastectomy  . COPD (chronic obstructive pulmonary disease) ([place])   . Heart murmur   . Hiatal hernia   . Hypertension   . UTI (lower urinary tract infection)   . UTI (urinary tract infection)    frequent in past    PAST  SURGICAL HISTORY:  Past Surgical History:  Procedure Laterality Date  . ABDOMINAL HYSTERECTOMY    . BONE MARROW ASPIRATION  2018  . FOOT SURGERY     for hammertoe  . MASTECTOMY Left    modified radical in August 1996  . PORTACATH PLACEMENT N/A 12/09/2017   Procedure: INSERTION PORT-A-CATH;  Surgeon: Vickie Epley, MD;  Location: [facility] ORS;  Service: Vascular;  Laterality: N/A;    SOCIAL HISTORY:  Social History   Tobacco Use  . Smoking status: Current Some Day Smoker    Packs/day: 0.25    Types: Cigarettes  . Smokeless tobacco: Never Used  Substance Use Topics  . Alcohol use: No    FAMILY HISTORY:  Family History  Problem Relation Age of Onset  . Cancer Brother        lung  . Hypertension Brother   . Anemia Brother   . Myelodysplastic syndrome Brother     DRUG ALLERGIES:  Allergies  Allergen Reactions  . Nitrofurantoin Other (See Comments)  . Penicillins Itching    Has patient had a PCN reaction causing immediate rash, facial/tongue/throat swelling, SOB or lightheadedness with hypotension: Yes Has patient had a PCN reaction causing severe rash involving mucus membranes or skin necrosis: Unknown Has patient had a PCN reaction that required hospitalization: Unknown Has patient had a PCN reaction occurring within the last 10 years: Yes If all of the above answers are "NO", then may proceed with Cephalosporin use.  Marland Kitchen  Sulfa Antibiotics Itching    REVIEW OF SYSTEMS:   Not able to obtain, due to patient's altered mental status.  MEDICATIONS AT HOME:  Prior to Admission medications   Medication Sig Start Date End Date Taking? Authorizing Provider  acetaminophen (RA ACETAMINOPHEN) 650 MG CR tablet Take 650 mg by mouth every 8 (eight) hours as needed.    Yes [provider]  alendronate (FOSAMAX) 70 MG tablet Take 70 mg by mouth once a week. [name] 04/01/15  Yes [provider]  Ascorbic Acid (VITAMIN C) 1000 MG tablet Take 1,000 mg by mouth daily.    Yes [provider]  aspirin 81 MG tablet Take 81 mg by mouth daily.   Yes [provider]  calcium-vitamin D (OSCAL WITH D) 250-125 MG-UNIT per tablet Take 1 tablet by mouth 2 (two) times daily.    Yes [provider]  gabapentin (NEURONTIN) 100 MG capsule Take 1 capsule by mouth at bedtime. 02/10/16  Yes [provider]  megestrol (MEGACE) 40 MG tablet Take 1 tablet (40 mg total) by mouth daily. Patient taking differently: Take 20 mg by mouth daily.  01/08/18  Yes [name], MD  Multiple Minerals (JOINT HEALTH MINERAL PO) Take 1 tablet by mouth daily.   Yes [provider]  Multiple Vitamin (MULTIVITAMIN) tablet Take 1 tablet by mouth daily.   Yes [provider]  psyllium (REGULOID) 0.52 g capsule Take 0.52 g by mouth daily. Metamucil   Yes [provider]  sodium chloride (OCEAN) 0.65 % SOLN nasal spray Place 1 spray into both nostrils as needed for congestion.   Yes [provider]  valsartan-hydrochlorothiazide (DIOVAN-HCT) 80-12.5 MG tablet Take 1 tablet by mouth daily.  01/07/18  Yes [provider]  meclizine (ANTIVERT) 25 MG tablet Take 1 tablet (25 mg total) by mouth 3 (three) times daily as needed for dizziness. Patient not taking: Reported on 04/12/2018 01/05/18   [name], MD      PHYSICAL EXAMINATION:   VITAL SIGNS: Blood pressure (!) 148/99, pulse 91, temperature (!) 102.1 F (38.9 C), temperature source Rectal, resp. rate (!) 33, weight 53.5 kg (118 lb), SpO2 90 %.  GENERAL:  84 y.o.-year-old patient lying in the bed with moderate distress, secondary to fever and chills.  EYES: Pupils equal, round, reactive to light and accommodation. No scleral icterus. HEENT: Head atraumatic, normocephalic. Oropharynx and nasopharynx clear.  NECK:  Supple, no jugular venous distention. No thyroid enlargement, no tenderness.  LUNGS: Reduced breath sounds bilaterally, no wheezing, rales,rhonchi  or crepitation. No use of accessory muscles of respiration.  CARDIOVASCULAR: S1, S2 normal. No S3/S4.  ABDOMEN: Soft, nontender, nondistended. Bowel sounds present. No organomegaly or mass.  EXTREMITIES: No pedal edema, cyanosis, or clubbing.  NEUROLOGIC: No focal weakness appreciated PSYCHIATRIC: The patient is alert, but confused, lethargic.  SKIN: No obvious rash, lesion, or ulcer.   LABORATORY PANEL:   CBC Recent Labs  Lab 04/06/18 0907 04/12/18 2053  WBC 1.0* 8.8  HGB 9.7* 9.9*  HCT 27.8* 28.2*  PLT 80* 101*  MCV 89.2 89.7  MCH 31.2 31.4  MCHC 35.0 35.0  RDW 17.0* 17.2*  LYMPHSABS 0.7* 0.4*  MONOABS 0.1* 0.2  EOSABS 0.0 0.0  BASOSABS 0.0 0.0   ------------------------------------------------------------------------------------------------------------------  Chemistries  Recent Labs  Lab 04/12/18 2053  NA 131*  K 4.4  CL 104  CO2 18*  GLUCOSE 162*  BUN 44*  CREATININE 1.28*  CALCIUM 9.7  AST 37  ALT 20  ALKPHOS 39  BILITOT 1.6*   ------------------------------------------------------------------------------------------------------------------ estimated creatinine clearance is 25.2 mL/min (A) (by C-G formula based on SCr of 1.28 mg/dL (H)). ------------------------------------------------------------------------------------------------------------------ No results for input(s): TSH, T4TOTAL, T3FREE, THYROIDAB in the last 72 hours.  Invalid input(s): FREET3   Coagulation profile No results for input(s): INR, PROTIME in the last 168 hours. ------------------------------------------------------------------------------------------------------------------- No results for input(s): DDIMER in the last 72 hours. -------------------------------------------------------------------------------------------------------------------  Cardiac Enzymes Recent Labs  Lab 04/12/18 2053  TROPONINI 0.37*    ------------------------------------------------------------------------------------------------------------------ Invalid input(s): POCBNP  ---------------------------------------------------------------------------------------------------------------  Urinalysis    Component Value Date/Time   COLORURINE AMBER (A) 04/12/2018 2053   APPEARANCEUR CLOUDY (A) 04/12/2018 2053   APPEARANCEUR Clear 06/05/2014 1405   LABSPEC 1.018 04/12/2018 2053   LABSPEC 1.015 06/05/2014 1405   PHURINE 7.0 04/12/2018 2053   GLUCOSEU NEGATIVE 04/12/2018 2053   GLUCOSEU Negative 06/05/2014 1405   HGBUR SMALL (A) 04/12/2018 2053   BILIRUBINUR NEGATIVE 04/12/2018 2053   BILIRUBINUR Negative 06/05/2014 [place] 04/12/2018 2053   PROTEINUR 100 (A) 04/12/2018 2053   NITRITE NEGATIVE 04/12/2018 2053   LEUKOCYTESUR MODERATE (A) 04/12/2018 2053   LEUKOCYTESUR Negative 06/05/2014 1405     RADIOLOGY: Dg Chest Port 1 View  Result Date: 04/12/2018 CLINICAL DATA:  Fever EXAM: PORTABLE CHEST 1 VIEW COMPARISON:  January 01, 2018 FINDINGS: Port-A-Cath tip is in the superior vena cava. No pneumothorax. There is no appreciable edema or consolidation. The heart size and pulmonary vascularity are normal. No adenopathy. There is aortic atherosclerosis. IMPRESSION: No edema or consolidation.  There is aortic atherosclerosis. Aortic Atherosclerosis (ICD10-I70.0). Electronically Signed   By: Lowella Grip III M.D.   On: 04/12/2018 21:34    EKG: Orders placed or performed during the hospital encounter of 04/12/18  . ED EKG 12-Lead  . ED EKG 12-Lead    IMPRESSION AND PLAN:  1.  Sepsis, likely secondary to acute UTI.  We will start IV fluid resuscitation and broad-spectrum IV antibiotics.  Continue supportive measures with Tylenol and antinausea medications as needed.  Continue to monitor clinically closely and follow lactic acid level. 2.  Acute UTI, will start patient on cefepime and vancomycin.   3.  Acute encephalopathy, multifactorial.  Patient has baseline dementia, but she is lethargic and more confused today, per family.  This is likely metabolic, secondary to acute infection.  We will continue to monitor clinically closely, while treating the underlying disease. 4.  Acute renal failure, likely prerenal, secondary to poor p.o. intake and fever.  Creatinine is slightly elevated at 1.28.  We will start IV fluids and continue to monitor kidney function closely.  Avoid nephrotoxic medications. 5. NSTEMI.  First troponin level is elevated at 0.37.  This is likely secondary to demand ischemia from sepsis.  Continue to monitor patient on telemetry and follow the troponin level.  We will start patient on aspirin.   All the records are reviewed and case discussed with ED provider. Management plans discussed with the patient, family and they are in agreement.  CODE STATUS: FULL    TOTAL TIME TAKING CARE OF THIS PATIENT: 50 minutes.    Amelia Jo M.D on 04/12/2018 at 10:32 PM  Between 7am to 6pm - Pager - [phone]  After 6pm go to [email or web address] - password [email or web address] [facility]  Office  [phone]  CC: Primary care physician; Cletis Athens, MD

## 2018-04-12 NOTE — ED Provider Notes (Signed)
[facility] Emergency Department Provider Note  Time seen: 8:40 PM  I have reviewed the triage vital signs and the nursing notes.   HISTORY  Chief Complaint Urinary Tract Infection    HPI Chelsea Horne is a 84 y.o. female with a past medical history of COPD, hypertension, frequent UTIs, presents to the emergency department for altered mental status.  According to EMS the patient presents from home, has baseline dementia lives with her daughter for the daughter is currently out of town and the caregiver is watching the patient.  EMS states the patient was not able to get up today due to generalized weakness, normally ambulates on her own with a walker.  Noted the patient have a temperature of 99.7.  Patient has dementia and cannot contribute to her history unable to complete review of systems.  Overall the patient appears well, calm and cooperative, no distress.  Denies any pain or discomfort.   Past Medical History:  Diagnosis Date  . Breast cancer ([place])    mastectomy  . COPD (chronic obstructive pulmonary disease) ([place])   . Heart murmur   . Hiatal hernia   . Hypertension   . UTI (lower urinary tract infection)   . UTI (urinary tract infection)    frequent in past    Patient Active Problem List   Diagnosis Date Noted  . Complete tear of right rotator cuff 05/03/2016  . Rotator cuff arthropathy, right 05/03/2016  . Myelodysplastic syndrome ([place]) 03/13/2015    Past Surgical History:  Procedure Laterality Date  . ABDOMINAL HYSTERECTOMY    . BONE MARROW ASPIRATION  2018  . FOOT SURGERY     for hammertoe  . MASTECTOMY Left    modified radical in August 1996  . PORTACATH PLACEMENT N/A 12/09/2017   Procedure: INSERTION PORT-A-CATH;  Surgeon: Vickie Epley, MD;  Location: [facility] ORS;  Service: Vascular;  Laterality: N/A;    Prior to Admission medications   Medication Sig Start Date End Date Taking? Authorizing Provider  acetaminophen (RA  ACETAMINOPHEN) 650 MG CR tablet Take 650 mg by mouth every 8 (eight) hours as needed.     [provider]  alendronate (FOSAMAX) 70 MG tablet Take 70 mg by mouth once a week. [place] 04/01/15   [provider]  Ascorbic Acid (VITAMIN C) 1000 MG tablet Take 1,000 mg by mouth daily.    [provider]  aspirin 81 MG tablet Take 81 mg by mouth daily.    [provider]  calcium-vitamin D (OSCAL WITH D) 250-125 MG-UNIT per tablet Take 1 tablet by mouth daily.    [provider]  gabapentin (NEURONTIN) 100 MG capsule Take 1 capsule by mouth at bedtime. 02/10/16   [provider]  meclizine (ANTIVERT) 25 MG tablet Take 1 tablet (25 mg total) by mouth 3 (three) times daily as needed for dizziness. 01/05/18   [name], MD  megestrol (MEGACE) 40 MG tablet Take 1 tablet (40 mg total) by mouth daily. Patient taking differently: Take 20 mg by mouth daily.  01/08/18   [name], MD  Multiple Minerals (JOINT HEALTH MINERAL PO) Take 1 tablet by mouth daily.    [provider]  Multiple Vitamin (MULTIVITAMIN) tablet Take 1 tablet by mouth daily.    [provider]  psyllium (REGULOID) 0.52 g capsule Take 0.52 g by mouth daily. Metamucil    [provider]  sodium chloride (OCEAN) 0.65 % SOLN nasal spray Place 1 spray into  both nostrils as needed for congestion.    [provider]  valsartan-hydrochlorothiazide (DIOVAN-HCT) 80-12.5 MG tablet  01/07/18   [provider]    Allergies  Allergen Reactions  . Nitrofurantoin Other (See Comments)  . Penicillins Itching    Has patient had a PCN reaction causing immediate rash, facial/tongue/throat swelling, SOB or lightheadedness with hypotension: Yes Has patient had a PCN reaction causing severe rash involving mucus membranes or skin necrosis: Unknown Has patient had a PCN reaction that required hospitalization: Unknown Has patient had a PCN reaction  occurring within the last 10 years: Yes If all of the above answers are "NO", then may proceed with Cephalosporin use.  . Sulfa Antibiotics Itching    Family History  Problem Relation Age of Onset  . Cancer Brother        lung  . Hypertension Brother   . Anemia Brother   . Myelodysplastic syndrome Brother     Social History Social History   Tobacco Use  . Smoking status: Current Some Day Smoker    Packs/day: 0.25    Types: Cigarettes  . Smokeless tobacco: Never Used  Substance Use Topics  . Alcohol use: No  . Drug use: No    Review of Systems Unable to obtain an adequate/accurate review of systems secondary to dementia  ____________________________________________   Constitutional: Alert, lying in bed, no distress. Eyes: Normal exam ENT   Head: Normocephalic and atraumatic   Mouth/Throat: Mucous membranes are moist. Cardiovascular: Normal rate, regular rhythm around 100 bpm. Respiratory: Normal respiratory effort without tachypnea nor retractions. Breath sounds are clear no wheeze rales or rhonchi. Gastrointestinal: Soft and nontender. No distention.   Musculoskeletal: Nontender with normal range of motion in all extremities. No lower extremity tenderness or edema. Neurologic:  Normal speech and language. No gross focal neurologic deficits Skin:  Skin is warm, dry and intact.  Psychiatric: Mood and affect are normal.   ____________________________________________    EKG  EKG reviewed and interpreted by myself shows sinus tachycardia at 106 bpm with a slightly widened QRS, normal axis, largely normal intervals, nonspecific ST changes but no ST elevation.  ____________________________________________    RADIOLOGY  Chest x-ray shows no acute abnormality.  ____________________________________________   INITIAL IMPRESSION / ASSESSMENT AND PLAN / ED COURSE  Pertinent labs & imaging results that were available during my care of the patient were reviewed  by me and considered in my medical decision making (see chart for details).  Patient presents to the emergency department with increased weakness unable to get up and ambulate today, low-grade temperature per EMS.  We will check labs, chest x-ray, urinalysis and continue to closely monitor.  Differential at this time would include metabolic or electrolyte abnormality, infectious etiology such as pneumonia or urinary tract infection.  His work-up shows a significant lactate elevation of 3.9.  Urinalysis consistent with urinary tract infection.  Patient receiving broad-spectrum antibiotics I will add 30 mL/kg normal saline bolus.  The troponin is 0.37 I believe is likely due to to demand ischemia more than a pure cardiac cause.  Given the patient's significant urinary tract infection with fever and tachycardia meeting sepsis criteria will admit to the hospitalist service for further treatment and continued IV antibiotics.  Patient agreeable this plan of care.  CRITICAL CARE Performed by: Harvest Dark   Total critical care time: 30 minutes  Critical care time was exclusive of separately billable procedures and treating other patients.  Critical care was necessary to treat or prevent  imminent or life-threatening deterioration.  Critical care was time spent personally by me on the following activities: development of treatment plan with patient and/or surrogate as well as nursing, discussions with consultants, evaluation of patient's response to treatment, examination of patient, obtaining history from patient or surrogate, ordering and performing treatments and interventions, ordering and review of laboratory studies, ordering and review of radiographic studies, pulse oximetry and re-evaluation of patient's condition.  ____________________________________________   FINAL CLINICAL IMPRESSION(S) / ED DIAGNOSES  Weakness Sepsis Urinary tract infection   Harvest Dark, MD 04/12/18  2216

## 2018-04-12 NOTE — ED Notes (Signed)
Daughter states pt has been c/o R thigh pain as well.  Pt's R Pedal pulse palpable and strong at this time.  Daughter updated on patient's condition with MD approval.

## 2018-04-12 NOTE — ED Notes (Signed)
Date and time results received: 04/12/2018 2139  Test: Troponin Critical Value: 0.37  Name of Provider Notified: Dr. Kerman Passey  Orders Received? Or Actions Taken?: Acknowledged

## 2018-04-12 NOTE — Progress Notes (Signed)
Pharmacy Antibiotic Note  Chelsea Horne is a 84 y.o. female admitted on 04/12/2018 with sepsis.  Pharmacy has been consulted for vanc/cefepime dosing.  Plan: Patient recevied vanc 1g and aztreonam 2g and levaquin 750 mg IV x 1  Will continue w/ vanc 750 mg IV q24h and draw a trough 05/30 @ 1700 Will start cefepime 2g IV daily  Ke 0.0253 T1/2 24 hours  Goal trough 15 - 20 mcg/mL  Weight: 118 lb (53.5 kg)  Temp (24hrs), Avg:101.8 F (38.8 C), Min:101.2 F (38.4 C), Max:102.1 F (38.9 C)  Recent Labs  Lab 04/06/18 0907 04/12/18 2053  WBC 1.0* 8.8  CREATININE  --  1.28*  LATICACIDVEN  --  3.9*    Estimated Creatinine Clearance: 25.2 mL/min (A) (by C-G formula based on SCr of 1.28 mg/dL (H)).    Allergies  Allergen Reactions  . Nitrofurantoin Other (See Comments)  . Penicillins Itching    Has patient had a PCN reaction causing immediate rash, facial/tongue/throat swelling, SOB or lightheadedness with hypotension: Yes Has patient had a PCN reaction causing severe rash involving mucus membranes or skin necrosis: Unknown Has patient had a PCN reaction that required hospitalization: Unknown Has patient had a PCN reaction occurring within the last 10 years: Yes If all of the above answers are "NO", then may proceed with Cephalosporin use.  . Sulfa Antibiotics Itching    Thank you for allowing pharmacy to be a part of this patient's care.  Tobie Lords, PharmD, BCPS Clinical Pharmacist 04/12/2018

## 2018-04-12 NOTE — ED Notes (Signed)
Date and time results received: 04/12/2018 2137  Test: Lactic Critical Value: 3.9  Name of Provider Notified: Dr. Kerman Passey  Orders Received? Or Actions Taken?: acknowledged.  No change to POC.

## 2018-04-12 NOTE — Progress Notes (Signed)
CODE SEPSIS - PHARMACY COMMUNICATION  **Broad Spectrum Antibiotics should be administered within 1 hour of Sepsis diagnosis**  Time Code Sepsis Called/Page Received: n/a  Antibiotics Ordered: vanc/aztreonam/levaquin  Time of 1st antibiotic administration: 2129  Additional action taken by pharmacy:   If necessary, Name of Provider/Nurse Contacted:     Tobie Lords ,PharmD Clinical Pharmacist  04/12/2018  10:31 PM

## 2018-04-12 NOTE — ED Notes (Signed)
Pt shivering and family asking for blankets. RN asked MD who verbalized it would be okay to give patient warm blankets. New blankets applied.

## 2018-04-12 NOTE — ED Notes (Signed)
Spoke to Dr. Duane Boston regarding patient's HR and the possibility of higher level of care.  Per Dr. Duane Boston, she feels that patient's high HR is due to sepsis and that once she gets more fluids and gets her temperature down, that her HR will decreased.  Instructed by MD to continue to take patient to current floor.

## 2018-04-13 ENCOUNTER — Inpatient Hospital Stay: Payer: Medicare Other | Admitting: Anesthesiology

## 2018-04-13 ENCOUNTER — Inpatient Hospital Stay: Payer: Medicare Other

## 2018-04-13 ENCOUNTER — Encounter
Admission: EM | Disposition: A | Discharge status: Hospice | Payer: Self-pay | Source: Home / Self Care | Attending: Internal Medicine

## 2018-04-13 ENCOUNTER — Inpatient Hospital Stay (HOSPITAL_COMMUNITY)
Admit: 2018-04-13 | Discharge: 2018-04-13 | Disposition: A | Discharge status: Home / Self Care | Payer: Medicare Other | Attending: Internal Medicine | Admitting: Internal Medicine

## 2018-04-13 ENCOUNTER — Other Ambulatory Visit: Payer: Self-pay

## 2018-04-13 DIAGNOSIS — I351 Nonrheumatic aortic (valve) insufficiency: Secondary | ICD-10-CM

## 2018-04-13 DIAGNOSIS — A419 Sepsis, unspecified organism: Secondary | ICD-10-CM

## 2018-04-13 HISTORY — PX: PORT-A-CATH REMOVAL: SHX5289

## 2018-04-13 LAB — LACTIC ACID, PLASMA
Lactic Acid, Venous: 2.9 mmol/L (ref 0.5–1.9)
Lactic Acid, Venous: 3 mmol/L (ref 0.5–1.9)
Lactic Acid, Venous: 5 mmol/L (ref 0.5–1.9)

## 2018-04-13 LAB — TROPONIN I: TROPONIN I: 0.45 ng/mL — AB (ref <0.03)

## 2018-04-13 LAB — CBC
HEMATOCRIT: 23.8 % — AB (ref 35.0–47.0)
Hemoglobin: 8.1 g/dL — ABNORMAL LOW (ref 12.0–16.0)
MCH: 31 pg (ref 26.0–34.0)
MCHC: 34.2 g/dL (ref 32.0–36.0)
MCV: 90.6 fL (ref 80.0–100.0)
Platelets: 73 10*3/uL — ABNORMAL LOW (ref 150–440)
RBC: 2.63 MIL/uL — ABNORMAL LOW (ref 3.80–5.20)
RDW: 17.5 % — AB (ref 11.5–14.5)
WBC: 8.1 10*3/uL (ref 3.6–11.0)

## 2018-04-13 LAB — BLOOD CULTURE ID PANEL (REFLEXED)
Acinetobacter baumannii: NOT DETECTED
CANDIDA ALBICANS: NOT DETECTED
CANDIDA GLABRATA: NOT DETECTED
Candida krusei: NOT DETECTED
Candida parapsilosis: NOT DETECTED
Candida tropicalis: NOT DETECTED
ENTEROBACTER CLOACAE COMPLEX: NOT DETECTED
ENTEROBACTERIACEAE SPECIES: NOT DETECTED
ENTEROCOCCUS SPECIES: NOT DETECTED
Escherichia coli: NOT DETECTED
Haemophilus influenzae: NOT DETECTED
Klebsiella oxytoca: NOT DETECTED
Klebsiella pneumoniae: NOT DETECTED
LISTERIA MONOCYTOGENES: NOT DETECTED
Methicillin resistance: NOT DETECTED
NEISSERIA MENINGITIDIS: NOT DETECTED
PSEUDOMONAS AERUGINOSA: NOT DETECTED
Proteus species: NOT DETECTED
STREPTOCOCCUS AGALACTIAE: NOT DETECTED
STREPTOCOCCUS PYOGENES: NOT DETECTED
STREPTOCOCCUS SPECIES: NOT DETECTED
Serratia marcescens: NOT DETECTED
Staphylococcus aureus (BCID): DETECTED — AB
Staphylococcus species: DETECTED — AB
Streptococcus pneumoniae: NOT DETECTED

## 2018-04-13 LAB — BASIC METABOLIC PANEL
Anion gap: 7 (ref 5–15)
BUN: 39 mg/dL — AB (ref 6–20)
CHLORIDE: 108 mmol/L (ref 101–111)
CO2: 17 mmol/L — ABNORMAL LOW (ref 22–32)
Calcium: 7.9 mg/dL — ABNORMAL LOW (ref 8.9–10.3)
Creatinine, Ser: 1.19 mg/dL — ABNORMAL HIGH (ref 0.44–1.00)
GFR calc Af Amer: 46 mL/min — ABNORMAL LOW (ref >60)
GFR calc non Af Amer: 39 mL/min — ABNORMAL LOW (ref >60)
Glucose, Bld: 148 mg/dL — ABNORMAL HIGH (ref 65–99)
POTASSIUM: 4 mmol/L (ref 3.5–5.1)
SODIUM: 132 mmol/L — AB (ref 135–145)

## 2018-04-13 LAB — GLUCOSE, CAPILLARY: GLUCOSE-CAPILLARY: 140 mg/dL — AB (ref 65–99)

## 2018-04-13 LAB — ECHOCARDIOGRAM COMPLETE
Height: 68 in
Weight: 1929.6 oz

## 2018-04-13 SURGERY — REMOVAL PORT-A-CATH
Anesthesia: Monitor Anesthesia Care

## 2018-04-13 MED ORDER — VALSARTAN-HYDROCHLOROTHIAZIDE 80-12.5 MG PO TABS: 1.0000 | ORAL_TABLET | Freq: Every day | ORAL | Status: DC

## 2018-04-13 MED ORDER — HEPARIN SODIUM (PORCINE) 5000 UNIT/ML IJ SOLN
5000.0000 [IU] | Freq: Three times a day (TID) | INTRAMUSCULAR | Status: DC
  Administered 2018-04-13 – 2018-04-18 (×12): 5000 [IU] via SUBCUTANEOUS
  Filled 2018-04-13 (×13): qty 1

## 2018-04-13 MED ORDER — OXYCODONE HCL 5 MG/5ML PO SOLN: 5.0000 mg | Freq: Once | ORAL | Status: DC | PRN

## 2018-04-13 MED ORDER — PHENYLEPHRINE HCL 10 MG/ML IJ SOLN
INTRAMUSCULAR | Status: DC | PRN
  Administered 2018-04-13 (×2): 100 ug via INTRAVENOUS

## 2018-04-13 MED ORDER — ADULT MULTIVITAMIN W/MINERALS CH
1.0000 | ORAL_TABLET | Freq: Every day | ORAL | Status: DC
  Administered 2018-04-13 – 2018-04-15 (×3): 1 via ORAL
  Filled 2018-04-13 (×3): qty 1

## 2018-04-13 MED ORDER — SALINE SPRAY 0.65 % NA SOLN
1.0000 | NASAL | Status: DC | PRN
  Filled 2018-04-13: qty 44

## 2018-04-13 MED ORDER — BUPIVACAINE-EPINEPHRINE (PF) 0.25% -1:200000 IJ SOLN: INTRAMUSCULAR | Status: DC | PRN

## 2018-04-13 MED ORDER — GABAPENTIN 100 MG PO CAPS
100.0000 mg | ORAL_CAPSULE | Freq: Every day | ORAL | Status: DC
  Administered 2018-04-13 – 2018-04-14 (×3): 100 mg via ORAL
  Filled 2018-04-13 (×4): qty 1

## 2018-04-13 MED ORDER — DOCUSATE SODIUM 100 MG PO CAPS
100.0000 mg | ORAL_CAPSULE | Freq: Two times a day (BID) | ORAL | Status: DC
  Administered 2018-04-13 – 2018-04-15 (×6): 100 mg via ORAL
  Filled 2018-04-13 (×7): qty 1

## 2018-04-13 MED ORDER — CALCIUM CARBONATE-VITAMIN D 500-200 MG-UNIT PO TABS
1.0000 | ORAL_TABLET | Freq: Two times a day (BID) | ORAL | Status: DC
  Administered 2018-04-13 – 2018-04-15 (×5): 1 via ORAL
  Filled 2018-04-13 (×6): qty 1

## 2018-04-13 MED ORDER — LIDOCAINE-EPINEPHRINE 1 %-1:100000 IJ SOLN
INTRAMUSCULAR | Status: DC | PRN
  Administered 2018-04-13: 5 mL

## 2018-04-13 MED ORDER — ASPIRIN EC 325 MG PO TBEC
325.0000 mg | DELAYED_RELEASE_TABLET | Freq: Every day | ORAL | Status: DC
  Administered 2018-04-13 – 2018-04-14 (×2): 325 mg via ORAL
  Filled 2018-04-13 (×2): qty 1

## 2018-04-13 MED ORDER — PNEUMOCOCCAL VAC POLYVALENT 25 MCG/0.5ML IJ INJ: 0.5000 mL | INJECTION | INTRAMUSCULAR | Status: DC

## 2018-04-13 MED ORDER — TRAZODONE HCL 50 MG PO TABS: 25.0000 mg | ORAL_TABLET | Freq: Every evening | ORAL | Status: DC | PRN

## 2018-04-13 MED ORDER — BISACODYL 5 MG PO TBEC: 5.0000 mg | DELAYED_RELEASE_TABLET | Freq: Every day | ORAL | Status: DC | PRN

## 2018-04-13 MED ORDER — FENTANYL CITRATE (PF) 100 MCG/2ML IJ SOLN: 25.0000 ug | INTRAMUSCULAR | Status: DC | PRN

## 2018-04-13 MED ORDER — PROPOFOL 10 MG/ML IV BOLUS
INTRAVENOUS | Status: AC
  Filled 2018-04-13: qty 20

## 2018-04-13 MED ORDER — LIDOCAINE-EPINEPHRINE 1 %-1:100000 IJ SOLN
INTRAMUSCULAR | Status: AC
  Filled 2018-04-13: qty 1

## 2018-04-13 MED ORDER — ONDANSETRON HCL 4 MG/2ML IJ SOLN: 4.0000 mg | Freq: Four times a day (QID) | INTRAMUSCULAR | Status: DC | PRN

## 2018-04-13 MED ORDER — ONDANSETRON HCL 4 MG PO TABS: 4.0000 mg | ORAL_TABLET | Freq: Four times a day (QID) | ORAL | Status: DC | PRN

## 2018-04-13 MED ORDER — OXYCODONE HCL 5 MG PO TABS: 5.0000 mg | ORAL_TABLET | Freq: Once | ORAL | Status: DC | PRN

## 2018-04-13 MED ORDER — PROPOFOL 500 MG/50ML IV EMUL
INTRAVENOUS | Status: DC | PRN
  Administered 2018-04-13: 40 ug/kg/min via INTRAVENOUS

## 2018-04-13 MED ORDER — SODIUM CHLORIDE 0.9 % IV SOLN
INTRAVENOUS | Status: DC
  Administered 2018-04-13 – 2018-04-14 (×5): via INTRAVENOUS

## 2018-04-13 MED ORDER — ALENDRONATE SODIUM 70 MG PO TABS: 70.0000 mg | ORAL_TABLET | ORAL | Status: DC

## 2018-04-13 MED ORDER — HYDROCODONE-ACETAMINOPHEN 5-325 MG PO TABS
1.0000 | ORAL_TABLET | ORAL | Status: DC | PRN
  Administered 2018-04-13 – 2018-04-15 (×2): 1 via ORAL
  Filled 2018-04-13: qty 2
  Filled 2018-04-13 (×3): qty 1

## 2018-04-13 MED ORDER — MECLIZINE HCL 25 MG PO TABS
25.0000 mg | ORAL_TABLET | Freq: Three times a day (TID) | ORAL | Status: DC | PRN
  Administered 2018-04-15: 25 mg via ORAL
  Filled 2018-04-13: qty 1

## 2018-04-13 MED ORDER — CEFAZOLIN SODIUM-DEXTROSE 1-4 GM/50ML-% IV SOLN
1.0000 g | Freq: Two times a day (BID) | INTRAVENOUS | Status: DC
  Administered 2018-04-13 (×2): 1 g via INTRAVENOUS
  Filled 2018-04-13 (×4): qty 50

## 2018-04-13 MED ORDER — VITAMIN C 500 MG PO TABS
1000.0000 mg | ORAL_TABLET | Freq: Every day | ORAL | Status: DC
Start: 2018-04-13 — End: 2018-04-19
  Administered 2018-04-13 – 2018-04-15 (×3): 1000 mg via ORAL
  Filled 2018-04-13 (×7): qty 2

## 2018-04-13 MED ORDER — HYDROCHLOROTHIAZIDE 12.5 MG PO CAPS: 12.5000 mg | ORAL_CAPSULE | Freq: Every day | ORAL | Status: DC

## 2018-04-13 MED ORDER — HEPARIN SODIUM (PORCINE) 5000 UNIT/ML IJ SOLN: 5000.0000 [IU] | Freq: Three times a day (TID) | INTRAMUSCULAR | Status: DC

## 2018-04-13 MED ORDER — IRBESARTAN 150 MG PO TABS: 75.0000 mg | ORAL_TABLET | Freq: Every day | ORAL | Status: DC

## 2018-04-13 MED ORDER — PROPOFOL 10 MG/ML IV BOLUS
INTRAVENOUS | Status: DC | PRN
  Administered 2018-04-13 (×5): 20 mg via INTRAVENOUS

## 2018-04-13 SURGICAL SUPPLY — 22 items
ADHESIVE MASTISOL STRL (MISCELLANEOUS) ×3 IMPLANT
CANISTER SUCT 1200ML W/VALVE (MISCELLANEOUS) ×3 IMPLANT
CHLORAPREP W/TINT 26ML (MISCELLANEOUS) ×3 IMPLANT
CLOSURE WOUND 1/2 X4 (GAUZE/BANDAGES/DRESSINGS) ×1
DRAPE LAPAROTOMY 77X122 PED (DRAPES) ×3 IMPLANT
ELECT REM PT RETURN 9FT ADLT (ELECTROSURGICAL) ×3
ELECTRODE REM PT RTRN 9FT ADLT (ELECTROSURGICAL) ×1 IMPLANT
GAUZE SPONGE 4X4 12PLY STRL (GAUZE/BANDAGES/DRESSINGS) ×3 IMPLANT
GLOVE BIO SURGEON STRL SZ8 (GLOVE) ×6 IMPLANT
GOWN STRL REUS W/ TWL LRG LVL3 (GOWN DISPOSABLE) ×2 IMPLANT
GOWN STRL REUS W/TWL LRG LVL3 (GOWN DISPOSABLE) ×4
KIT TURNOVER KIT A (KITS) ×3 IMPLANT
LABEL OR SOLS (LABEL) ×3 IMPLANT
NDL HYPO 25X1 1.5 SAFETY (NEEDLE) ×1 IMPLANT
NEEDLE HYPO 25X1 1.5 SAFETY (NEEDLE) ×3 IMPLANT
PACK BASIN MINOR ARMC (MISCELLANEOUS) ×3 IMPLANT
SPONGE LAP 18X18 RF (DISPOSABLE) ×3 IMPLANT
STRIP CLOSURE SKIN 1/2X4 (GAUZE/BANDAGES/DRESSINGS) ×2 IMPLANT
SUT MNCRL AB 4-0 PS2 18 (SUTURE) ×3 IMPLANT
SUT VIC AB 3-0 SH 27 (SUTURE) ×2
SUT VIC AB 3-0 SH 27X BRD (SUTURE) ×1 IMPLANT
SYR 20CC LL (SYRINGE) ×3 IMPLANT

## 2018-04-13 NOTE — Progress Notes (Signed)
ID PROGRESS NOTE   84yo F with hx breast ca with portacath in place, presents with AMS, fevers, hypotension. Found to have MSSA bacteremia. Currently on vanco/cefepime/aztreonam.   Recommendation :  1) narrow abtx to cefazolin 2gm IV Q 8hr, ask pharmacy to renally dose 2) repeat blood cx on 5/28 3) recommend to evaluate for removal of port 4) please get TTE  Can call me for further recs,  Tanishka Drolet B. [place] [facility] Infectious [facility] [phone]

## 2018-04-13 NOTE — Op Note (Signed)
04/13/2018  5:52 PM  PATIENT:  Chelsea Horne  84 y.o. female  PRE-OPERATIVE DIAGNOSIS:  MSSA Bacteremia  POST-OPERATIVE DIAGNOSIS:  MSSA Bacteremia  PROCEDURE:  REMOVAL PORT-A-CATH  SURGEON:  Surgeon(s) and Role:    * Glendoris Nodarse W, DO - Primary  ASSISTANTS: OR Staff  ANESTHESIA:   LOCAL/MAC  EBL:  1 mL   LOCAL MEDICATIONS USED:  5cc 1% Lidocaine w/ epinephrine  SPECIMEN:  Portacath tip for culture  FINDINGS: Port-a-cath site appeared normal. No gross signs of infection.  COMPLICATIONS: NONE  DESCRIPTION OF PROCEDURE:  Informed consent was obtained from the patient's daughter. The procedure along with the benefits, risks, and alternatives were explained. Questions were sought and answered. The patient's daughter agreed to proceed with removal of the subcutaneous port.  The patient was brought to the OR and placed supine on the operating table. MAC was induced. The patient was secured and all bony prominences were padded. A time-out was performed confirming the correct patient, procedure, site, and antibiotics.  The right chest was prepped and draped in usual sterile fashion. 5 cc of 1% lidocaine with epinephrine was injected at the operative site. A 2cm incision was made over the previous scar and carried down to the port. The port was easily dissected free. The catheter was exposed, clamped with a hemostat and the entire port and catheter were removed together. Pressure was held at the IJ insertion site. The catheter tip was sent to the lab for culture.  The pocket was inspected and found to be clean and free of evidence of gross infection. The capsule was scored with the blade to discourage seroma formation. Hemostasis was ensured with electrocautery.  The skin was closed with a running 4-0 monocryl suture and dressed with skin glue. A light pressure dressing was applied to discourage seroma formation. This may be removed in the morning.  At the conclusion of the  procedure, all instrument and sponge counts were correct. The patient tolerated the procedure well and was transferred to the recovery room in stable condition.

## 2018-04-13 NOTE — Brief Op Note (Signed)
04/13/2018  5:52 PM  PATIENT:  Chelsea Horne  84 y.o. female  PRE-OPERATIVE DIAGNOSIS:  MSSA Bacteremia  POST-OPERATIVE DIAGNOSIS:  MSSA Bacteremia  PROCEDURE:  Procedure(s): REMOVAL PORT-A-CATH (N/A)  SURGEON:  Surgeon(s) and Role:    * Kaplin, Aviva W, DO - Primary  ASSISTANTS: OR Staff  ANESTHESIA:   LOCAL/MAC  EBL:  1 mL   BLOOD ADMINISTERED: NONE  DRAINS: NONE  LOCAL MEDICATIONS USED:  5cc 1% Lidocaine w/ epinephrine  SPECIMEN:  Portacath tip for culture  DISPOSITION OF SPECIMEN:  Lab for culture  COUNTS:  CORRECT  PATIENT DISPOSITION:  Transfer back to floor   Delay start of Pharmacological VTE agent (>24hrs) due to surgical blood loss or risk of bleeding: NO

## 2018-04-13 NOTE — H&P (Signed)
SURGICAL HISTORY AND PHYSICAL   Chelsea Horne NFA:[id number] DOB: Jan 27, 1927 DOA: 04/12/2018   History of Present Illness:  Chelsea Horne is an 84 y.o. female with a history of dementia and myelodysplastic syndrome who is admitted with sepsis and MSSA bacteremia. She recently had a subcutaneous medication port placed in the right chest in order to receive frequent blood transfusions. In light of current bacteremia, the infectious disease team recommends removal of the medication port. The history is obtained from the chart.   Review of Systems: Unable to perform ROS: Dementia   Past Medical History:      Past Medical History:  Diagnosis Date  . Breast cancer ([place])    mastectomy  . COPD (chronic obstructive pulmonary disease) ([place])   . Heart murmur   . Hiatal hernia   . Hypertension   . UTI (lower urinary tract infection)   . UTI (urinary tract infection)    frequent in past   Past Surgical History:       Past Surgical History:  Procedure Laterality Date  . ABDOMINAL HYSTERECTOMY    . BONE MARROW ASPIRATION  2018  . FOOT SURGERY     for hammertoe  . MASTECTOMY Left    modified radical in August 1996  . PORTACATH PLACEMENT N/A 12/09/2017   Procedure: INSERTION PORT-A-CATH; Surgeon: Vickie Epley, MD; Location: [facility] ORS; Service: Vascular; Laterality: N/A;   Allergies:       Allergies  Allergen Reactions  . Nitrofurantoin Other (See Comments)  . Penicillins Itching    Has patient had a PCN reaction causing immediate rash, facial/tongue/throat swelling, SOB or lightheadedness with hypotension: Yes  Has patient had a PCN reaction causing severe rash involving mucus membranes or skin necrosis: Unknown  Has patient had a PCN reaction that required hospitalization: Unknown  Has patient had a PCN reaction occurring within the last 10 years: Yes  If all of the above answers are "NO", then may proceed with Cephalosporin use.  . Sulfa Antibiotics Itching   Social  History:  reports that she has been smoking cigarettes. She has been smoking about 0.25 packs per day. She has never used smokeless tobacco. She reports that she does not drink alcohol or use drugs.  Family History:       Family History  Problem Relation Age of Onset  . Cancer Brother    lung  . Hypertension Brother   . Anemia Brother   . Myelodysplastic syndrome Brother    Physical Exam:        Vitals:   04/13/18 0405 04/13/18 0458 04/13/18 1040 04/13/18 1211  BP: (!) 243/230 119/61  (!) 108/49  Pulse: (!) 101 97  87  Resp: 17   20  Temp: 98.2 F (36.8 C)  (!) 101.5 F (38.6 C) 98.4 F (36.9 C)  TempSrc: Oral  Axillary Oral  SpO2: 100%   98%  Weight:  54.7 kg (120 lb 9.6 oz)    Height:       Physical Exam  Constitutional: No distress.  HENT:  Head: Normocephalic and atraumatic.  Eyes: Conjunctivae are normal. Right eye exhibits no discharge. No scleral icterus.  Neck: Neck supple. No tracheal deviation present.  Cardiovascular: Normal rate and regular rhythm.  Respiratory: Effort normal. No respiratory distress.  Medication port in right chest without signs of infection. No erythema, edema, induration, or drainage.  GI: Soft. She exhibits no distension. There is no tenderness.  Musculoskeletal: She exhibits no edema.  Neurological:  Minimally  responsive  Skin: Skin is warm and dry.   Data reviewed: I have personally reviewed following labs and imaging studies  Labs:  Microbiology         Recent Results (from the past 240 hour(s))  Blood Culture (routine x 2) Status: None (Preliminary result)   Collection Time: 04/12/18 8:53 PM  Result Value Ref Range Status   Specimen Description   Final    BLOOD PORTA CATH  Performed at [facility], [address] [place], [place] [address]    Special Requests   Final    BOTTLES DRAWN AEROBIC AND ANAEROBIC Blood Culture adequate volume  Performed at [facility], [address] [place], [place]  [address]    Culture Setup Time   Final    GRAM POSITIVE COCCI IN CLUSTERS  IN BOTH AEROBIC AND ANAEROBIC BOTTLES  CRITICAL RESULT CALLED TO, READ BACK BY AND VERIFIED WITH: RODNEY GRUBB AT 0981 ON 04/13/18 BY SNJ  Performed at [facility], [place] [address] [place], [place] [address]    Culture GRAM POSITIVE COCCI  Final   Report Status PENDING  Incomplete  Blood Culture ID Panel (Reflexed) Status: Abnormal   Collection Time: 04/12/18 8:53 PM  Result Value Ref Range Status   Enterococcus species NOT DETECTED NOT DETECTED Final   Listeria monocytogenes NOT DETECTED NOT DETECTED Final   Staphylococcus species DETECTED (A) NOT DETECTED Final    Comment: CRITICAL RESULT CALLED TO, READ BACK BY AND VERIFIED WITH:  RODNEY GRUBB AT 0729 ON 04/13/18 BY SNJ    Staphylococcus aureus DETECTED (A) NOT DETECTED Final    Comment: Methicillin (oxacillin) susceptible Staphylococcus aureus (MSSA). Preferred therapy is anti staphylococcal beta lactam antibiotic (Cefazolin or Nafcillin), unless clinically contraindicated.  CRITICAL RESULT CALLED TO, READ BACK BY AND VERIFIED WITH:  RODNEY GRUBB AT 8295 ON 04/13/18 BY SNJ    Methicillin resistance NOT DETECTED NOT DETECTED Final   Streptococcus species NOT DETECTED NOT DETECTED Final   Streptococcus agalactiae NOT DETECTED NOT DETECTED Final   Streptococcus pneumoniae NOT DETECTED NOT DETECTED Final   Streptococcus pyogenes NOT DETECTED NOT DETECTED Final   Acinetobacter baumannii NOT DETECTED NOT DETECTED Final   Enterobacteriaceae species NOT DETECTED NOT DETECTED Final   Enterobacter cloacae complex NOT DETECTED NOT DETECTED Final   Escherichia coli NOT DETECTED NOT DETECTED Final   Klebsiella oxytoca NOT DETECTED NOT DETECTED Final   Klebsiella pneumoniae NOT DETECTED NOT DETECTED Final   Proteus species NOT DETECTED NOT DETECTED Final   Serratia marcescens NOT DETECTED NOT DETECTED Final   Haemophilus influenzae NOT DETECTED NOT DETECTED Final    Neisseria meningitidis NOT DETECTED NOT DETECTED Final   Pseudomonas aeruginosa NOT DETECTED NOT DETECTED Final   Candida albicans NOT DETECTED NOT DETECTED Final   Candida glabrata NOT DETECTED NOT DETECTED Final   Candida krusei NOT DETECTED NOT DETECTED Final   Candida parapsilosis NOT DETECTED NOT DETECTED Final   Candida tropicalis NOT DETECTED NOT DETECTED Final    Comment: Performed at [facility], [address] [place], [place] [address]  Blood Culture (routine x 2) Status: None (Preliminary result)   Collection Time: 04/12/18 8:54 PM  Result Value Ref Range Status   Specimen Description   Final    BLOOD RIGHT ANTECUBITAL  Performed at [facility], [address] [place], [place] [address]    Special Requests   Final    BOTTLES DRAWN AEROBIC AND ANAEROBIC Blood Culture adequate volume  Performed  at [facility], [address], [place], [place] [address]    Culture Setup Time   Final    GRAM POSITIVE COCCI IN CLUSTERS  IN BOTH AEROBIC AND ANAEROBIC BOTTLES  CRITICAL VALUE NOTED. VALUE IS CONSISTENT WITH PREVIOUSLY REPORTED AND CALLED VALUE.  Performed at [facility], [address] [place], [place] [address]    Culture [facility] POSITIVE COCCI  Final   Report Status PENDING  Incomplete   Inpatient Medications:  Scheduled Meds:  . aspirin EC 325 mg Oral Daily  . calcium-vitamin D 1 tablet Oral BID  . docusate sodium 100 mg Oral BID  . gabapentin 100 mg Oral QHS  . heparin 5,000 Units Subcutaneous Q8H  . multivitamin with minerals 1 tablet Oral Daily  . [START ON 04/14/2018] pneumococcal 23 valent vaccine 0.5 mL Intramuscular Tomorrow-1000  . vitamin C 1,000 mg Oral Daily   Continuous Infusions:  . sodium chloride 100 mL/hr at 04/13/18 1107  . ceFAZolin (ANCEF) IV 1 g (04/13/18 1107)    Impression/Recommendations: This patient is an 84 year-old female with a history of dementia and myelodysplastic syndrome for which she receives  frequent blood transfusions. In January of this year she had a subcutaneous port placed. She now presents with sepsis and MSSA bacteremia. Infectious disease team recommends removal of the subcutaneous port. This will be done today. I have discussed the procedure along with risks and benefits with the patient's daughter.    [place].

## 2018-04-13 NOTE — Consult Note (Signed)
Surgical Consultation   Chelsea Horne  FUX:[id number]  DOB: [birth year]  DOA: 04/12/2018   Requesting physician: Dr. Posey Pronto  Reason for consultation: Removal of subcutaneous medication port   History of Present Illness: Chelsea Horne is an 84 y.o. female with a history of dementia and myelodysplastic syndrome who is admitted with sepsis and MSSA bacteremia. She recently had a subcutaneous medication port placed in the right chest in order to receive frequent blood transfusions. In light of current bacteremia, the infectious disease team recommends removal of the medication port. The history is obtained from the chart.   Review of Systems:  Review of Systems  Unable to perform ROS: Dementia    Past Medical History: Past Medical History:  Diagnosis Date  . Breast cancer ([place])    mastectomy  . COPD (chronic obstructive pulmonary disease) ([place])   . Heart murmur   . Hiatal hernia   . Hypertension   . UTI (lower urinary tract infection)   . UTI (urinary tract infection)    frequent in past    Past Surgical History: Past Surgical History:  Procedure Laterality Date  . ABDOMINAL HYSTERECTOMY    . BONE MARROW ASPIRATION  2018  . FOOT SURGERY     for hammertoe  . MASTECTOMY Left    modified radical in August 1996  . PORTACATH PLACEMENT N/A 12/09/2017   Procedure: INSERTION PORT-A-CATH;  Surgeon: Vickie Epley, MD;  Location: [facility] ORS;  Service: Vascular;  Laterality: N/A;     Allergies:   Allergies  Allergen Reactions  . Nitrofurantoin Other (See Comments)  . Penicillins Itching    Has patient had a PCN reaction causing immediate rash, facial/tongue/throat swelling, SOB or lightheadedness with hypotension: Yes Has patient had a PCN reaction causing severe rash involving mucus membranes or skin necrosis: Unknown Has patient had a PCN reaction that required hospitalization: Unknown Has patient had a PCN reaction occurring within the last 10 years: Yes If all  of the above answers are "NO", then may proceed with Cephalosporin use.  . Sulfa Antibiotics Itching     Social History:  reports that she has been smoking cigarettes.  She has been smoking about 0.25 packs per day. She has never used smokeless tobacco. She reports that she does not drink alcohol or use drugs.   Family History: Family History  Problem Relation Age of Onset  . Cancer Brother        lung  . Hypertension Brother   . Anemia Brother   . Myelodysplastic syndrome Brother     Physical Exam: Vitals:   04/13/18 0405 04/13/18 0458 04/13/18 1040 04/13/18 1211  BP: (!) 243/230 119/61  (!) 108/49  Pulse: (!) 101 97  87  Resp: 17   20  Temp: 98.2 F (36.8 C)  (!) 101.5 F (38.6 C) 98.4 F (36.9 C)  TempSrc: Oral  Axillary Oral  SpO2: 100%   98%  Weight:  54.7 kg (120 lb 9.6 oz)    Height:        Physical Exam  Constitutional: No distress.  HENT:  Head: Normocephalic and atraumatic.  Eyes: Conjunctivae are normal. Right eye exhibits no discharge. No scleral icterus.  Neck: Neck supple. No tracheal deviation present.  Cardiovascular: Normal rate and regular rhythm.  Respiratory: Effort normal. No respiratory distress.  Medication port in right chest without signs of infection. No erythema, edema, induration, or drainage.  GI: Soft. She exhibits no distension. There is no tenderness.  Musculoskeletal: She exhibits  no edema.  Neurological:  Minimally responsive  Skin: Skin is warm and dry.     Data reviewed:  I have personally reviewed following labs and imaging studies Labs:  CBC: Recent Labs  Lab 04/12/18 2053 04/13/18 0217  WBC 8.8 8.1  NEUTROABS 8.2*  --   HGB 9.9* 8.1*  HCT 28.2* 23.8*  MCV 89.7 90.6  PLT 101* 73*    Basic Metabolic Panel: Recent Labs  Lab 04/12/18 2053 04/13/18 0217  NA 131* 132*  K 4.4 4.0  CL 104 108  CO2 18* 17*  GLUCOSE 162* 148*  BUN 44* 39*  CREATININE 1.28* 1.19*  CALCIUM 9.7 7.9*   GFR Estimated Creatinine  Clearance: 27.7 mL/min (A) (by C-G formula based on SCr of 1.19 mg/dL (H)). Liver Function Tests: Recent Labs  Lab 04/12/18 2053  AST 37  ALT 20  ALKPHOS 39  BILITOT 1.6*  PROT 6.8  ALBUMIN 3.8   Cardiac Enzymes: Recent Labs  Lab 04/12/18 2053 04/13/18 0217  TROPONINI 0.37* 0.45*   BNP: Invalid input(s): POCBNP CBG: Recent Labs  Lab 04/13/18 0751  GLUCAP 140*   Urinalysis    Component Value Date/Time   COLORURINE AMBER (A) 04/12/2018 2053   APPEARANCEUR CLOUDY (A) 04/12/2018 2053   APPEARANCEUR Clear 06/05/2014 1405   LABSPEC 1.018 04/12/2018 2053   LABSPEC 1.015 06/05/2014 1405   PHURINE 7.0 04/12/2018 2053   GLUCOSEU NEGATIVE 04/12/2018 2053   GLUCOSEU Negative 06/05/2014 1405   HGBUR SMALL (A) 04/12/2018 2053   BILIRUBINUR NEGATIVE 04/12/2018 2053   BILIRUBINUR Negative 06/05/2014 [place] 04/12/2018 2053   PROTEINUR 100 (A) 04/12/2018 2053   NITRITE NEGATIVE 04/12/2018 2053   LEUKOCYTESUR MODERATE (A) 04/12/2018 2053   LEUKOCYTESUR Negative 06/05/2014 1405    Microbiology Recent Results (from the past 240 hour(s))  Blood Culture (routine x 2)     Status: None (Preliminary result)   Collection Time: 04/12/18  8:53 PM  Result Value Ref Range Status   Specimen Description   Final    BLOOD PORTA CATH Performed at [facility], [address] [place], [place] [address]    Special Requests   Final    BOTTLES DRAWN AEROBIC AND ANAEROBIC Blood Culture adequate volume Performed at [facility], [address] [place], [place] [address]    Culture  Setup Time   Final    GRAM POSITIVE COCCI IN CLUSTERS IN BOTH AEROBIC AND ANAEROBIC BOTTLES CRITICAL RESULT CALLED TO, READ BACK BY AND VERIFIED WITH: RODNEY GRUBB AT 8295 ON 04/13/18 BY SNJ Performed at [facility], [place] [address], [place], [place] [address]    Culture GRAM POSITIVE COCCI  Final   Report Status PENDING  Incomplete  Blood Culture ID Panel (Reflexed)      Status: Abnormal   Collection Time: 04/12/18  8:53 PM  Result Value Ref Range Status   Enterococcus species NOT DETECTED NOT DETECTED Final   Listeria monocytogenes NOT DETECTED NOT DETECTED Final   Staphylococcus species DETECTED (A) NOT DETECTED Final    Comment: CRITICAL RESULT CALLED TO, READ BACK BY AND VERIFIED WITH: RODNEY GRUBB AT 0729 ON 04/13/18 BY SNJ    Staphylococcus aureus DETECTED (A) NOT DETECTED Final    Comment: Methicillin (oxacillin) susceptible Staphylococcus aureus (MSSA). Preferred therapy is anti staphylococcal beta lactam antibiotic (Cefazolin or Nafcillin), unless clinically contraindicated. CRITICAL RESULT CALLED TO, READ BACK BY AND VERIFIED WITH: RODNEY GRUBB AT 0729 ON 04/13/18 BY SNJ  Methicillin resistance NOT DETECTED NOT DETECTED Final   Streptococcus species NOT DETECTED NOT DETECTED Final   Streptococcus agalactiae NOT DETECTED NOT DETECTED Final   Streptococcus pneumoniae NOT DETECTED NOT DETECTED Final   Streptococcus pyogenes NOT DETECTED NOT DETECTED Final   Acinetobacter baumannii NOT DETECTED NOT DETECTED Final   Enterobacteriaceae species NOT DETECTED NOT DETECTED Final   Enterobacter cloacae complex NOT DETECTED NOT DETECTED Final   Escherichia coli NOT DETECTED NOT DETECTED Final   Klebsiella oxytoca NOT DETECTED NOT DETECTED Final   Klebsiella pneumoniae NOT DETECTED NOT DETECTED Final   Proteus species NOT DETECTED NOT DETECTED Final   Serratia marcescens NOT DETECTED NOT DETECTED Final   Haemophilus influenzae NOT DETECTED NOT DETECTED Final   Neisseria meningitidis NOT DETECTED NOT DETECTED Final   Pseudomonas aeruginosa NOT DETECTED NOT DETECTED Final   Candida albicans NOT DETECTED NOT DETECTED Final   Candida glabrata NOT DETECTED NOT DETECTED Final   Candida krusei NOT DETECTED NOT DETECTED Final   Candida parapsilosis NOT DETECTED NOT DETECTED Final   Candida tropicalis NOT DETECTED NOT DETECTED Final    Comment: Performed  at [facility], [address] [place], [place] [address]  Blood Culture (routine x 2)     Status: None (Preliminary result)   Collection Time: 04/12/18  8:54 PM  Result Value Ref Range Status   Specimen Description   Final    BLOOD RIGHT ANTECUBITAL Performed at [facility], [address], [place], [place] [address]    Special Requests   Final    BOTTLES DRAWN AEROBIC AND ANAEROBIC Blood Culture adequate volume Performed at [facility], [address] [place], [place] [address]    Culture  Setup Time   Final    GRAM POSITIVE COCCI IN CLUSTERS IN BOTH AEROBIC AND ANAEROBIC BOTTLES CRITICAL VALUE NOTED.  VALUE IS CONSISTENT WITH PREVIOUSLY REPORTED AND CALLED VALUE. Performed at [facility], [address] [place], [place] [address]    Culture [facility] POSITIVE COCCI  Final   Report Status PENDING  Incomplete       Inpatient Medications:   Scheduled Meds: . aspirin EC  325 mg Oral Daily  . calcium-vitamin D  1 tablet Oral BID  . docusate sodium  100 mg Oral BID  . gabapentin  100 mg Oral QHS  . heparin  5,000 Units Subcutaneous Q8H  . multivitamin with minerals  1 tablet Oral Daily  . [START ON 04/14/2018] pneumococcal 23 valent vaccine  0.5 mL Intramuscular Tomorrow-1000  . vitamin C  1,000 mg Oral Daily   Continuous Infusions: . sodium chloride 100 mL/hr at 04/13/18 1107  .  ceFAZolin (ANCEF) IV 1 g (04/13/18 1107)     Radiological Exams on Admission: Dg Chest Port 1 View  Result Date: 04/12/2018 CLINICAL DATA:  Fever EXAM: PORTABLE CHEST 1 VIEW COMPARISON:  January 01, 2018 FINDINGS: Port-A-Cath tip is in the superior vena cava. No pneumothorax. There is no appreciable edema or consolidation. The heart size and pulmonary vascularity are normal. No adenopathy. There is aortic atherosclerosis. IMPRESSION: No edema or consolidation.  There is aortic atherosclerosis. Aortic Atherosclerosis (ICD10-I70.0). Electronically Signed    By: Lowella Grip III M.D.   On: 04/12/2018 21:34    Impression/Recommendations This patient is an 84 year-old female with a history of dementia and myelodysplastic syndrome for which she receives frequent blood transfusions. In January of this year she had a subcutaneous port placed. She now presents with sepsis and MSSA  bacteremia. Infectious disease team recommends removal of the subcutaneous port. This will be done today. I have discussed the procedure along with risks and benefits with the patient's daughter.    Thank you for this consultation.     Hadley Pen D.O. 04/13/2018, 1:04 PM

## 2018-04-13 NOTE — Care Management Note (Signed)
Case Management Note  Patient Details  Name: Chelsea Horne MRN: [id number] Date of Birth: Jan 22, 1927  Subjective/Objective:    Admitted to [facility] with sepsis. (Infection of Port). Daughter Hoyle Sauer lives in the home. [phone] or [phone]). No Home Health. No skilled facility. No home oxygen. Hospital bed, rolling walker, and raided toilet seat in the home. Caregivers: Monday-Friday 10:30am-1:30pm, 3:30pm-6:30pm. Has fallen in the past. Fair appetite.  Poor appetite since this admission.                Action/Plan: Port was placed January 2019. Possible removable today 04/13/18 Will continue to follow for discharge plans   Expected Discharge Date:                  Expected Discharge Plan:     In-House Referral:     Discharge planning Services     Post Acute Care Choice:    Choice offered to:     DME Arranged:    DME Agency:     HH Arranged:    HH Agency:     Status of Service:     If discussed at [employer], dates discussed:    Additional Comments:  Shelbie Ammons, RN MSN CCM Care Management [phone] 04/13/2018, 11:18 AM

## 2018-04-13 NOTE — Anesthesia Procedure Notes (Signed)
Date/Time: 04/13/2018 4:59 PM Performed by: Nelda Marseille, CRNA Pre-anesthesia Checklist: Patient identified, Emergency Drugs available, Suction available, Patient being monitored and Timeout performed Oxygen Delivery Method: Simple face mask

## 2018-04-13 NOTE — Anesthesia Preprocedure Evaluation (Signed)
Anesthesia Evaluation  Patient identified by MRN, date of birth, ID band Patient awake and Patient confused    Reviewed: Allergy & Precautions, H&P , NPO status , Patient's Chart, lab work & pertinent test results  History of Anesthesia Complications Negative for: history of anesthetic complications  Airway Mallampati: III  TM Distance: <3 FB Neck ROM: limited    Dental  (+) Chipped, Poor Dentition   Pulmonary COPD, Current Smoker,           Cardiovascular hypertension, (-) angina(-) Past MI + Valvular Problems/Murmurs      Neuro/Psych PSYCHIATRIC DISORDERS Dementia  Neuromuscular disease    GI/Hepatic Neg liver ROS, hiatal hernia,   Endo/Other  negative endocrine ROS  Renal/GU      Musculoskeletal   Abdominal   Peds  Hematology negative hematology ROS (+)   Anesthesia Other Findings Septic   Past Medical History: No date: Breast cancer ([place])     Comment:  mastectomy No date: COPD (chronic obstructive pulmonary disease) ([facility]) No date: Heart murmur No date: Hiatal hernia No date: Hypertension No date: UTI (lower urinary tract infection) No date: UTI (urinary tract infection)     Comment:  frequent in past  Past Surgical History: No date: ABDOMINAL HYSTERECTOMY 2018: BONE MARROW ASPIRATION No date: FOOT SURGERY     Comment:  for hammertoe No date: MASTECTOMY; Left     Comment:  modified radical in August 1996 12/09/2017: PORTACATH PLACEMENT; N/A     Comment:  Procedure: INSERTION PORT-A-CATH;  Surgeon: Vickie Epley, MD;  Location: [facility] ORS;  Service: Vascular;                Laterality: N/A;  BMI    Body Mass Index:  18.34 kg/m      Reproductive/Obstetrics negative OB ROS                             Anesthesia Physical Anesthesia Plan  ASA: IV  Anesthesia Plan: MAC   Post-op Pain Management:    Induction: Intravenous  PONV Risk Score and Plan:    Airway Management Planned: Natural Airway and Nasal Cannula  Additional Equipment:   Intra-op Plan:   Post-operative Plan:   Informed Consent: I have reviewed the patients History and Physical, chart, labs and discussed the procedure including the risks, benefits and alternatives for the proposed anesthesia with the patient or authorized representative who has indicated his/her understanding and acceptance.   Dental Advisory Given  Plan Discussed with: Anesthesiologist, CRNA and Surgeon  Anesthesia Plan Comments: (Plan to suspend DNR for procedure.  Patient and daughter voiced understanding.  Patient and family informed that patient is higher risk for complications from anesthesia during this procedure due to their medical history and age including but not limited to post operative cognitive dysfunction.  They voiced understanding.  Patient and daughter consented for risks of anesthesia including but not limited to:  - adverse reactions to medications - damage to teeth, lips or other oral mucosa - sore throat or hoarseness - Damage to heart, brain, lungs or loss of life  They voiced understanding.)        Anesthesia Quick Evaluation

## 2018-04-13 NOTE — Progress Notes (Signed)
Pharmacy Antibiotic Note  Chelsea Horne is a 84 y.o. female admitted on 04/12/2018 with MSSA bacteremia.  Pharmacy has been consulted for cefazolin dosing. Patient narrowed from cefepime and vancomycin. Patient tolerated cefepime this admission and has tolerated ceftriaxone on previous admissions.   Plan: Will start cefazolin 1g IV Q12hr (renally adjusted cefazolin 2g IV Q8hr). Will recheck serum creatinine with am labs and adjust as warranted.   Patient to have repeat blood cultures on 5/28 and will need to get TEE.   Height: 5\' 8"  (172.7 cm) Weight: 120 lb 9.6 oz (54.7 kg) IBW/kg (Calculated) : 63.9  Temp (24hrs), Avg:100.6 F (38.1 C), Min:98.2 F (36.8 C), Max:102.1 F (38.9 C)  Recent Labs  Lab 04/12/18 2053 04/12/18 2355 04/13/18 0211 04/13/18 0217 04/13/18 0525  WBC 8.8  --   --  8.1  --   CREATININE 1.28*  --   --  1.19*  --   LATICACIDVEN 3.9* 5.0* 3.0*  --  2.9*    Estimated Creatinine Clearance: 27.7 mL/min (A) (by C-G formula based on SCr of 1.19 mg/dL (H)).    Allergies  Allergen Reactions  . Nitrofurantoin Other (See Comments)  . Penicillins Itching    Has patient had a PCN reaction causing immediate rash, facial/tongue/throat swelling, SOB or lightheadedness with hypotension: Yes Has patient had a PCN reaction causing severe rash involving mucus membranes or skin necrosis: Unknown Has patient had a PCN reaction that required hospitalization: Unknown Has patient had a PCN reaction occurring within the last 10 years: Yes If all of the above answers are "NO", then may proceed with Cephalosporin use.  . Sulfa Antibiotics Itching    Antimicrobials this admission: Aztreonam 5/26 x 1 Levofloxacin 5/26 x 1  Vancomycin 5/26 x 1 Cefepime 5/27 x 1 Cefazolin 5/27 >>  Dose adjustments this admission: N/A  Microbiology results: 5/26 BCx: MSSA  Thank you for allowing pharmacy to be a part of this patient's care.  Simpson,Michael L 04/13/2018 9:56 AM

## 2018-04-13 NOTE — Progress Notes (Signed)
Patient ID: Chelsea Horne, female   DOB: 1927/08/14, 84 y.o.   MRN: [id number] Advance Directive:yes  Today a meeting took place with the  Daughter in the room Patient unable to participate due to Dementia The following were discussed:Patient's diagnosis:  Pt admitted with MSSA sepsis suspected due to Patient's progosis:critical but stable. D/w dter Ms Matthew Saras and pt is DNR per pt's wishes  Will consider Palliative care if need be  Time spent during discussion 16 mins Fritzi Mandes, MD

## 2018-04-13 NOTE — Transfer of Care (Signed)
Immediate Anesthesia Transfer of Care Note  Patient: Chelsea Horne  Procedure(s) Performed: REMOVAL PORT-A-CATH (N/A )  Patient Location: PACU  Anesthesia Type:General  Level of Consciousness: sedated  Airway & Oxygen Therapy: Patient Spontanous Breathing and Patient connected to face mask oxygen  Post-op Assessment: Report given to RN and Post -op Vital signs reviewed and stable  Post vital signs: Reviewed and stable  Last Vitals:  Vitals Value Taken Time  BP    Temp    Pulse    Resp    SpO2      Last Pain:  Vitals:   04/13/18 1211  TempSrc: Oral         Complications: No apparent anesthesia complications

## 2018-04-13 NOTE — Progress Notes (Signed)
PHARMACY - PHYSICIAN COMMUNICATION CRITICAL VALUE ALERT - BLOOD CULTURE IDENTIFICATION (BCID)  Results for orders placed or performed during the hospital encounter of 04/12/18  Blood Culture ID Panel (Reflexed) (Collected: 04/12/2018  8:53 PM)  Result Value Ref Range   Enterococcus species NOT DETECTED NOT DETECTED   Listeria monocytogenes NOT DETECTED NOT DETECTED   Staphylococcus species DETECTED (A) NOT DETECTED   Staphylococcus aureus DETECTED (A) NOT DETECTED   Methicillin resistance NOT DETECTED NOT DETECTED   Streptococcus species NOT DETECTED NOT DETECTED   Streptococcus agalactiae NOT DETECTED NOT DETECTED   Streptococcus pneumoniae NOT DETECTED NOT DETECTED   Streptococcus pyogenes NOT DETECTED NOT DETECTED   Acinetobacter baumannii NOT DETECTED NOT DETECTED   Enterobacteriaceae species NOT DETECTED NOT DETECTED   Enterobacter cloacae complex NOT DETECTED NOT DETECTED   Escherichia coli NOT DETECTED NOT DETECTED   Klebsiella oxytoca NOT DETECTED NOT DETECTED   Klebsiella pneumoniae NOT DETECTED NOT DETECTED   Proteus species NOT DETECTED NOT DETECTED   Serratia marcescens NOT DETECTED NOT DETECTED   Haemophilus influenzae NOT DETECTED NOT DETECTED   Neisseria meningitidis NOT DETECTED NOT DETECTED   Pseudomonas aeruginosa NOT DETECTED NOT DETECTED   Candida albicans NOT DETECTED NOT DETECTED   Candida glabrata NOT DETECTED NOT DETECTED   Candida krusei NOT DETECTED NOT DETECTED   Candida parapsilosis NOT DETECTED NOT DETECTED   Candida tropicalis NOT DETECTED NOT DETECTED    Name of physician (or Provider) Contacted: Dr Posey Pronto    Changes to prescribed antibiotics required: patient is on vancomycin and cefepime. No changes to therapy required.  Dallie Piles 04/13/2018  7:40 AM

## 2018-04-13 NOTE — Anesthesia Post-op Follow-up Note (Signed)
Anesthesia QCDR form completed.        

## 2018-04-13 NOTE — Progress Notes (Signed)
[facility] [place] NAME: Chelsea Horne    MR#:  [id number]  DATE OF BIRTH:  1926-12-15  SUBJECTIVE:  came in after being found lethargic at home. Patient poor historian due to dementia. She is awake alert how were disoriented. Spoke with daughter in the room.  REVIEW OF SYSTEMS:   Review of Systems  Unable to perform ROS: Dementia   Tolerating Diet:yes Tolerating PT: pending  DRUG ALLERGIES:   Allergies  Allergen Reactions  . Nitrofurantoin Other (See Comments)  . Penicillins Itching    Has patient had a PCN reaction causing immediate rash, facial/tongue/throat swelling, SOB or lightheadedness with hypotension: Yes Has patient had a PCN reaction causing severe rash involving mucus membranes or skin necrosis: Unknown Has patient had a PCN reaction that required hospitalization: Unknown Has patient had a PCN reaction occurring within the last 10 years: Yes If all of the above answers are "NO", then may proceed with Cephalosporin use.  . Sulfa Antibiotics Itching    VITALS:  Blood pressure (!) 108/49, pulse 87, temperature 98.4 F (36.9 C), temperature source Oral, resp. rate 20, height 5\' 8"  (1.727 m), weight 54.7 kg (120 lb 9.6 oz), SpO2 98 %.  PHYSICAL EXAMINATION:   Physical Exam  GENERAL:  84 y.o.-year-old patient lying in the bed with no acute distress.  EYES: Pupils equal, round, reactive to light and accommodation. No scleral icterus. Extraocular muscles intact.  HEENT: Head atraumatic, normocephalic. Oropharynx and nasopharynx clear.  NECK:  Supple, no jugular venous distention. No thyroid enlargement, no tenderness.  LUNGS: Normal breath sounds bilaterally, no wheezing, rales, rhonchi. No use of accessory muscles of respiration. Right upper chest port++ CARDIOVASCULAR: S1, S2 normal. No murmurs, rubs, or gallops.  ABDOMEN: Soft, nontender, nondistended. Bowel sounds present. No organomegaly or mass.   EXTREMITIES: No cyanosis, clubbing or edema b/l.    NEUROLOGIC: C limited due to dementia. All extremities well. PSYCHIATRIC:  patient is alert  SKIN: No obvious rash, lesion, or ulcer.   LABORATORY PANEL:  CBC Recent Labs  Lab 04/13/18 0217  WBC 8.1  HGB 8.1*  HCT 23.8*  PLT 73*    Chemistries  Recent Labs  Lab 04/12/18 2053 04/13/18 0217  NA 131* 132*  K 4.4 4.0  CL 104 108  CO2 18* 17*  GLUCOSE 162* 148*  BUN 44* 39*  CREATININE 1.28* 1.19*  CALCIUM 9.7 7.9*  AST 37  --   ALT 20  --   ALKPHOS 39  --   BILITOT 1.6*  --    Cardiac Enzymes Recent Labs  Lab 04/13/18 0217  TROPONINI 0.45*   RADIOLOGY:  Dg Chest Port 1 View  Result Date: 04/12/2018 CLINICAL DATA:  Fever EXAM: PORTABLE CHEST 1 VIEW COMPARISON:  January 01, 2018 FINDINGS: Port-A-Cath tip is in the superior vena cava. No pneumothorax. There is no appreciable edema or consolidation. The heart size and pulmonary vascularity are normal. No adenopathy. There is aortic atherosclerosis. IMPRESSION: No edema or consolidation.  There is aortic atherosclerosis. Aortic Atherosclerosis (ICD10-I70.0). Electronically Signed   By: Lowella Grip III M.D.   On: 04/12/2018 21:34   ASSESSMENT AND PLAN:  Chelsea Horne  is a 84 y.o. female with a known history of COPD, hypertension, recurrent urinary tract infections and breast cancer status post remote mastectomy.Patient was brought to emergency room for acute mental status change with increased lethargy and generalized weakness.  She was also noticed to be febrile at  home.   1.   MSSA sepsis, likely secondary to port infection.   -four out of four bottles positive for MSSA bacteria. -Received a phone call from Dr. Graylon Good ID at [facility] cone recommends removal of port, change to IV cefazolin -follow blood cultures -spoke with Dr. Holland Falling surgery on call for port removal. Patient is scheduled for this afternoon -spoke with daughter in the room at length  updated.  2.  Acute UTI,  -currently on cefazolin that should cover. Follow-up urine culture  3.  Acute encephalopathy, multifactorial.  Patient has baseline dementia, but she is lethargic and more confused today, per family.  This is likely metabolic, secondary to acute infection.  We will continue to monitor clinically closely, while treating the underlying disease.  4.  Acute renal failure, likely prerenal, secondary to poor p.o. intake and fever.  Creatinine is slightly elevated at 1.28.  We will start IV fluids and continue to monitor kidney function closely.  Avoid nephrotoxic medications.  5. Elevated troponin in the setting of sepsis   First troponin level is elevated at 0.37.  This is likely secondary to demand ischemia from sepsis.  Continue to monitor patient on telemetry and follow the troponin level.  - - patient on aspirin.  6. History of dementia. Patient has caregivers round-the-clock along with daughter to help her out at home.  7. History of MDS -patient follows with Dr. Grayland Ormond. Spoke with Dr. Grayland Ormond regarding patient support removal. She gets IV blood transfusions every now and then due to her MDS. -To be on appropriate shots as well. -Transfuse if needed. -Hemoglobin 9.9--- 8.1   Case discussed with Care Management/Social Worker. Management plans discussed with the patient, family and they are in agreement.  CODE STATUS: DNR  DVT Prophylaxis: Lovenox  TOTAL TIME TAKING CARE OF THIS PATIENT: 35 minutes.  >50% time spent on counselling and coordination of care  POSSIBLE D/C IN 2 to 3 DAYS, DEPENDING ON CLINICAL CONDITION.  Note: This dictation was prepared with Dragon dictation along with smaller phrase technology. Any transcriptional errors that result from this process are unintentional.  Fritzi Mandes M.D on 04/13/2018 at 2:01 PM  Between 7am to 6pm - Pager - [phone]  After 6pm go to [email or web address] - password [email or web address] [facility]   Office  [phone]  CC: Primary care physician; Cletis Athens, MDPatient ID: Chelsea Horne, female   DOB: December 23, 1926, 84 y.o.   MRN: [id number]

## 2018-04-13 NOTE — Anesthesia Postprocedure Evaluation (Signed)
Anesthesia Post Note  Patient: Chelsea Horne  Procedure(s) Performed: REMOVAL PORT-A-CATH (N/A )  Patient location during evaluation: PACU Anesthesia Type: General Level of consciousness: awake Pain management: pain level controlled Vital Signs Assessment: post-procedure vital signs reviewed and stable Respiratory status: spontaneous breathing, nonlabored ventilation, respiratory function stable and patient connected to nasal cannula oxygen Cardiovascular status: blood pressure returned to baseline and stable Postop Assessment: no apparent nausea or vomiting Anesthetic complications: no     Last Vitals:  Vitals:   04/13/18 1740 04/13/18 1752  BP: (!) 110/42   Pulse: 85 80  Resp: (!) 21 (!) 26  Temp:  36.7 C  SpO2: 98% 98%    Last Pain:  Vitals:   04/13/18 1752  TempSrc:   PainSc: Asleep                 Precious Haws Brown Dunlap

## 2018-04-14 ENCOUNTER — Encounter: Payer: Self-pay | Admitting: Surgery

## 2018-04-14 ENCOUNTER — Inpatient Hospital Stay: Payer: Medicare Other

## 2018-04-14 LAB — CREATININE, SERUM
CREATININE: 1 mg/dL (ref 0.44–1.00)
GFR calc Af Amer: 56 mL/min — ABNORMAL LOW (ref >60)
GFR, EST NON AFRICAN AMERICAN: 48 mL/min — AB (ref >60)

## 2018-04-14 LAB — GLUCOSE, CAPILLARY: Glucose-Capillary: 130 mg/dL — ABNORMAL HIGH (ref 65–99)

## 2018-04-14 MED ORDER — DILTIAZEM HCL 25 MG/5ML IV SOLN
15.0000 mg | Freq: Once | INTRAVENOUS | Status: AC
  Administered 2018-04-14: 15:00:00 15 mg via INTRAVENOUS
  Filled 2018-04-14 (×2): qty 5
  Filled 2018-04-14: qty 3
  Filled 2018-04-14: qty 5

## 2018-04-14 MED ORDER — CALCIUM POLYCARBOPHIL 625 MG PO TABS
625.0000 mg | ORAL_TABLET | Freq: Every day | ORAL | Status: DC
  Administered 2018-04-14 – 2018-04-15 (×2): 625 mg via ORAL
  Filled 2018-04-14 (×6): qty 1

## 2018-04-14 MED ORDER — CEFAZOLIN SODIUM-DEXTROSE 2-4 GM/100ML-% IV SOLN
2.0000 g | Freq: Three times a day (TID) | INTRAVENOUS | Status: DC
  Administered 2018-04-14 – 2018-04-17 (×7): 2 g via INTRAVENOUS
  Filled 2018-04-14 (×14): qty 100

## 2018-04-14 MED ORDER — ASPIRIN EC 81 MG PO TBEC
81.0000 mg | DELAYED_RELEASE_TABLET | Freq: Every day | ORAL | Status: DC
  Administered 2018-04-15: 12:00:00 81 mg via ORAL
  Filled 2018-04-14: qty 1

## 2018-04-14 MED ORDER — MEGESTROL ACETATE 20 MG PO TABS
20.0000 mg | ORAL_TABLET | Freq: Every day | ORAL | Status: DC
  Administered 2018-04-14 – 2018-04-15 (×2): 20 mg via ORAL
  Filled 2018-04-14 (×6): qty 1

## 2018-04-14 NOTE — Progress Notes (Signed)
PT Cancellation Note  Patient Details Name: Chelsea Horne MRN: [id number] DOB: 1927-05-31   Cancelled Treatment:    Reason Eval/Treat Not Completed: Medical issues which prohibited therapy; Pt's resting HR up into the low 130's with previously documented HRs mostly in the 80s and 90s with a high of 101 bpm.  Spoke to nursing with nursing already aware of recent increase in pt's HR and MD paged.  Will hold PT evaluation this date and will attempt to see pt at a future date as medically appropriate.      Linus Salmons PT, DPT 04/14/18, 2:51 PM

## 2018-04-14 NOTE — Progress Notes (Signed)
Pharmacy Antibiotic Note  Chelsea Horne is a 84 y.o. female admitted on 04/12/2018 with MSSA bacteremia.  Pharmacy has been consulted for cefazolin dosing. Patient narrowed from cefepime and vancomycin. Patient tolerated cefepime this admission and has tolerated ceftriaxone on previous admissions.   Plan: Will start cefazolin 1g IV Q12hr (renally adjusted cefazolin 2g IV Q8hr). Will recheck serum creatinine with am labs and adjust as warranted.   Patient to have repeat blood cultures on 5/28 and will need to get TEE.   5/28 05:32 SCr has improved to 1 mg/dL, CrCl approximately 35 to 40 mL/min. Increase cefazolin to 2 gm IV Q8H. TTE shows no obvious vegetation, 4 of 4 bottles BCx with MSSA still pending susceptibilities. Pharmacy will continue to follow.   Height: 5\' 8"  (172.7 cm) Weight: 129 lb 11.2 oz (58.8 kg) IBW/kg (Calculated) : 63.9  Temp (24hrs), Avg:98.9 F (37.2 C), Min:97.5 F (36.4 C), Max:101.5 F (38.6 C)  Recent Labs  Lab 04/12/18 2053 04/12/18 2355 04/13/18 0211 04/13/18 0217 04/13/18 0525 04/14/18 0532  WBC 8.8  --   --  8.1  --   --   CREATININE 1.28*  --   --  1.19*  --  1.00  LATICACIDVEN 3.9* 5.0* 3.0*  --  2.9*  --     Estimated Creatinine Clearance: 35.4 mL/min (by C-G formula based on SCr of 1 mg/dL).    Allergies  Allergen Reactions  . Nitrofurantoin Other (See Comments)  . Penicillins Itching    Has patient had a PCN reaction causing immediate rash, facial/tongue/throat swelling, SOB or lightheadedness with hypotension: Yes Has patient had a PCN reaction causing severe rash involving mucus membranes or skin necrosis: Unknown Has patient had a PCN reaction that required hospitalization: Unknown Has patient had a PCN reaction occurring within the last 10 years: Yes If all of the above answers are "NO", then may proceed with Cephalosporin use.  . Sulfa Antibiotics Itching    Antimicrobials this admission: Aztreonam 5/26 x 1 Levofloxacin 5/26  x 1  Vancomycin 5/26 x 1 Cefepime 5/27 x 1 Cefazolin 5/27 >>  Dose adjustments this admission: N/A  Microbiology results: 5/26 BCx: MSSA  Thank you for allowing pharmacy to be a part of this patient's care.  Laural Benes 04/14/2018 8:43 AM

## 2018-04-14 NOTE — Progress Notes (Signed)
[facility] [place] NAME: Chelsea Horne    MR#:  [id number]  DATE OF BIRTH:  27-Aug-1927  SUBJECTIVE:  came in after being found lethargic at home. Patient poor historian due to dementia. She is awake alert how were disoriented. Spoke with daughter in the room. Now new complaints.  REVIEW OF SYSTEMS:   Review of Systems  Unable to perform ROS: Dementia   Tolerating Diet:yes Tolerating PT: pending  DRUG ALLERGIES:   Allergies  Allergen Reactions  . Nitrofurantoin Other (See Comments)  . Penicillins Itching    Has patient had a PCN reaction causing immediate rash, facial/tongue/throat swelling, SOB or lightheadedness with hypotension: Yes Has patient had a PCN reaction causing severe rash involving mucus membranes or skin necrosis: Unknown Has patient had a PCN reaction that required hospitalization: Unknown Has patient had a PCN reaction occurring within the last 10 years: Yes If all of the above answers are "NO", then may proceed with Cephalosporin use.  . Sulfa Antibiotics Itching    VITALS:  Blood pressure 128/64, pulse 95, temperature 98.3 F (36.8 C), temperature source Oral, resp. rate 20, height 5\' 8"  (1.727 m), weight 58.8 kg (129 lb 11.2 oz), SpO2 98 %.  PHYSICAL EXAMINATION:   Physical Exam  GENERAL:  84 y.o.-year-old patient lying in the bed with no acute distress.  EYES: Pupils equal, round, reactive to light and accommodation. No scleral icterus. Extraocular muscles intact.  HEENT: Head atraumatic, normocephalic. Oropharynx and nasopharynx clear.  NECK:  Supple, no jugular venous distention. No thyroid enlargement, no tenderness.  LUNGS: Normal breath sounds bilaterally, no wheezing, rales, rhonchi. No use of accessory muscles of respiration. Right upper chest port++ CARDIOVASCULAR: S1, S2 normal. No murmurs, rubs, or gallops.  ABDOMEN: Soft, nontender, nondistended. Bowel sounds present. No organomegaly  or mass.  EXTREMITIES: No cyanosis, clubbing or edema b/l.    NEUROLOGIC: C limited due to dementia. All extremities well. PSYCHIATRIC:  patient is alert  SKIN: No obvious rash, lesion, or ulcer.   LABORATORY PANEL:  CBC Recent Labs  Lab 04/13/18 0217  WBC 8.1  HGB 8.1*  HCT 23.8*  PLT 73*    Chemistries  Recent Labs  Lab 04/12/18 2053 04/13/18 0217 04/14/18 0532  NA 131* 132*  --   K 4.4 4.0  --   CL 104 108  --   CO2 18* 17*  --   GLUCOSE 162* 148*  --   BUN 44* 39*  --   CREATININE 1.28* 1.19* 1.00  CALCIUM 9.7 7.9*  --   AST 37  --   --   ALT 20  --   --   ALKPHOS 39  --   --   BILITOT 1.6*  --   --    Cardiac Enzymes Recent Labs  Lab 04/13/18 0217  TROPONINI 0.45*   RADIOLOGY:  Dg Chest Port 1 View  Result Date: 04/12/2018 CLINICAL DATA:  Fever EXAM: PORTABLE CHEST 1 VIEW COMPARISON:  January 01, 2018 FINDINGS: Port-A-Cath tip is in the superior vena cava. No pneumothorax. There is no appreciable edema or consolidation. The heart size and pulmonary vascularity are normal. No adenopathy. There is aortic atherosclerosis. IMPRESSION: No edema or consolidation.  There is aortic atherosclerosis. Aortic Atherosclerosis (ICD10-I70.0). Electronically Signed   By: Lowella Grip III M.D.   On: 04/12/2018 21:34   Dg Hip Unilat With Pelvis 2-3 Views Left  Result Date: 04/13/2018 CLINICAL DATA:  C/o left hip  pain since last night; family states no injury; pain when palpitating her hip EXAM: DG HIP (WITH OR WITHOUT PELVIS) 2-3V LEFT COMPARISON:  None. FINDINGS: No fracture.  No bone lesion. Hip joints, SI joints and symphysis pubis are normally spaced and aligned. There is significant arthropathic change. Bones are demineralized. IMPRESSION: 1. No fracture, bone lesion or significant joint abnormality. Electronically Signed   By: Lajean Manes M.D.   On: 04/13/2018 14:25   ASSESSMENT AND PLAN:  Chelsea Horne  is a 84 y.o. female with a known history of COPD,  hypertension, recurrent urinary tract infections and breast cancer status post remote mastectomy.Patient was brought to emergency room for acute mental status change with increased lethargy and generalized weakness.  She was also noticed to be febrile at home.   1.MSSA sepsis, likely secondary to port infection.   -four out of four bottles positive for MSSA bacteria. -Received a phone call from Dr. Graylon Good ID at [facility] cone recommends removal of port, change to IV cefazolin -TTE showed no vegetation. No need for TEE per ID. Will need PICC line for IV abxs for 4 weeks -appreciate Dr. Holland Falling surgery s/p removal of port on 04/13/2018 -spoke with daughter in the room at length updated.  2.  Acute UTI -currently on cefazolin that should cover. Follow-up urine culture shows GNR  3.  Acute encephalopathy, multifactorial.  Patient has baseline dementia, but she is lethargic and more confused today, per family.  This is likely metabolic, secondary to acute infection.   4. Acute renal failure, likely prerenal, secondary to poor p.o. intake and fever.   -Creatinine is slightly elevated at 1.28.--1.0  -cont IV fluids and continue to monitor kidney function closely.   -Avoid nephrotoxic medications.  5. Elevated troponin in the setting of sepsis    -First troponin level is elevated at 0.37. - this is likely secondary to demand ischemia from sepsis.   - patient on aspirin.  6. History of dementia. - Patient has caregivers round-the-clock along with daughter to help her out at home.  7. History of MDS -patient follows with Dr. Grayland Ormond. Spoke with Dr. Grayland Ormond regarding patient support removal. She gets IV blood transfusions every now and then due to her MDS. -To be on appropriate shots as well. -Transfuse if needed. -Hemoglobin 9.9--- 8.1  PT to see pt  Case discussed with Care Management/Social Worker. Management plans discussed with the patient, family and they are in agreement.  CODE STATUS:  DNR  DVT Prophylaxis: Lovenox  TOTAL TIME TAKING CARE OF THIS PATIENT: 35 minutes.  >50% time spent on counselling and coordination of care  POSSIBLE D/C IN few DAYS, DEPENDING ON CLINICAL CONDITION.  Note: This dictation was prepared with Dragon dictation along with smaller phrase technology. Any transcriptional errors that result from this process are unintentional.  Fritzi Mandes M.D on 04/14/2018 at 2:08 PM  Between 7am to 6pm - Pager - [phone]  After 6pm go to [email or web address] - password [email or web address] [facility]  Office  [phone]  CC: Primary care physician; Cletis Athens, MDPatient ID: [name], female   DOB: [birth year], 84 y.o.   MRN: [id number]

## 2018-04-14 NOTE — Progress Notes (Addendum)
Infectious Disease Long Term IV Antibiotic Orders Chelsea Horne [birth year]  Diagnosis:  MSSA bacteremia, portacath removed.   Culture results MSSA   LABS Lab Results  Component Value Date   CREATININE 1.00 04/14/2018   Lab Results  Component Value Date   WBC 8.1 04/13/2018   HGB 8.1 (L) 04/13/2018   HCT 23.8 (L) 04/13/2018   MCV 90.6 04/13/2018   PLT 73 (L) 04/13/2018   No results found for: ESRSEDRATE, POCTSEDRATE No results found for: CRP  Allergies:  Allergies  Allergen Reactions  . Nitrofurantoin Other (See Comments)  . Penicillins Itching    Has patient had a PCN reaction causing immediate rash, facial/tongue/throat swelling, SOB or lightheadedness with hypotension: Yes Has patient had a PCN reaction causing severe rash involving mucus membranes or skin necrosis: Unknown Has patient had a PCN reaction that required hospitalization: Unknown Has patient had a PCN reaction occurring within the last 10 years: Yes If all of the above answers are "NO", then may proceed with Cephalosporin use.  . Sulfa Antibiotics Itching    Discharge antibiotics Cefazolin      2 grams every  8 hours  PICC Care per protocol Labs weekly while on IV antibiotics -FAX weekly labs to [phone] CBC w diff   Cr    Planned duration of antibiotics 4 weeks from neg bcx  Prior to removal of PICC please discuss with Dr Grayland Ormond to see if he wants to leave it in for transfusions   Stop date June 25th 2019 Follow up clinic date No need for ID fu - should see PCP   Leonel Ramsay, MD

## 2018-04-14 NOTE — Consult Note (Signed)
[facility] Infectious Disease     Reason for Consult:MSSA bacteremia   Referring Physician: Nicholes Mango Date of Admission:  04/12/2018   Active Problems:   Sepsis [facility])   HPI: Chelsea Horne is a 84 y.o. female  With a history of dementia, COPD, hypertension and prior breast cancer admitted with lethargy confusion and found to have hypotension tachycardia and sepsis.  White count on admission was 8 but temp was 102.  Blood cultures turn positive for methicillin sensitive staph aureus.  Urine culture was positive for Klebsiella. UA 21-50 wbc.  Follow-up blood cultures have been done May 27 and are no growth to date.  She had imaging done which showed no evidence of pneumonia.  Hip x-ray was done for hip pain and there was no evidence of fracture or bone lesions. She was initially treated with IV vancomycin and cefepime but is now on IV cefazolin and tolerating it well.  She had removal of her Port-A-Cath by surgery on May 27.  TTE was negative.  Past Medical History:  Diagnosis Date  . Breast cancer ([place])    mastectomy  . COPD (chronic obstructive pulmonary disease) ([place])   . Heart murmur   . Hiatal hernia   . Hypertension   . UTI (lower urinary tract infection)   . UTI (urinary tract infection)    frequent in past   Past Surgical History:  Procedure Laterality Date  . ABDOMINAL HYSTERECTOMY    . BONE MARROW ASPIRATION  2018  . FOOT SURGERY     for hammertoe  . MASTECTOMY Left    modified radical in August 1996  . PORT-A-CATH REMOVAL N/A 04/13/2018   Procedure: REMOVAL PORT-A-CATH;  Surgeon: Hadley Pen, DO;  Location: [facility] ORS;  Service: General;  Laterality: N/A;  . PORTACATH PLACEMENT N/A 12/09/2017   Procedure: INSERTION PORT-A-CATH;  Surgeon: Vickie Epley, MD;  Location: [facility] ORS;  Service: Vascular;  Laterality: N/A;   Social History   Tobacco Use  . Smoking status: Current Some Day Smoker    Packs/day: 0.25    Types: Cigarettes  . Smokeless tobacco:  Never Used  Substance Use Topics  . Alcohol use: No  . Drug use: No   Family History  Problem Relation Age of Onset  . Cancer Brother        lung  . Hypertension Brother   . Anemia Brother   . Myelodysplastic syndrome Brother     Allergies:  Allergies  Allergen Reactions  . Nitrofurantoin Other (See Comments)  . Penicillins Itching    Has patient had a PCN reaction causing immediate rash, facial/tongue/throat swelling, SOB or lightheadedness with hypotension: Yes Has patient had a PCN reaction causing severe rash involving mucus membranes or skin necrosis: Unknown Has patient had a PCN reaction that required hospitalization: Unknown Has patient had a PCN reaction occurring within the last 10 years: Yes If all of the above answers are "NO", then may proceed with Cephalosporin use.  . Sulfa Antibiotics Itching    Current antibiotics: Antibiotics Given (last 72 hours)    Date/Time Action Medication Dose Rate   04/12/18 2129 New Bag/Given   aztreonam (AZACTAM) 2 g in sodium chloride 0.9 % 100 mL IVPB 2 g 200 mL/hr   04/12/18 2130 New Bag/Given   levofloxacin (LEVAQUIN) IVPB 750 mg 750 mg 100 mL/hr   04/12/18 2134 New Bag/Given  [Per Donna Christen, pharmD compatible with levaquin]   vancomycin (VANCOCIN) IVPB 1000 mg/200 mL premix 1,000 mg 200 mL/hr  04/13/18 0133 New Bag/Given  [dispensing delay]   ceFEPIme (MAXIPIME) 2 g in sodium chloride 0.9 % 100 mL IVPB 2 g 200 mL/hr   04/13/18 1107 New Bag/Given   ceFAZolin (ANCEF) IVPB 1 g/50 mL premix 1 g 100 mL/hr   04/13/18 2124 New Bag/Given   ceFAZolin (ANCEF) IVPB 1 g/50 mL premix 1 g 100 mL/hr   04/14/18 0926 New Bag/Given   ceFAZolin (ANCEF) IVPB 2g/100 mL premix 2 g 200 mL/hr      MEDICATIONS: . [START ON 04/15/2018] aspirin EC  81 mg Oral Horne  . calcium-vitamin D  1 tablet Oral BID  . diltiazem  15 mg Intravenous Once  . docusate sodium  100 mg Oral BID  . gabapentin  100 mg Oral QHS  . heparin  5,000 Units Subcutaneous  Q8H  . megestrol  20 mg Oral Horne  . multivitamin with minerals  1 tablet Oral Horne  . pneumococcal 23 valent vaccine  0.5 mL Intramuscular Tomorrow-1000  . polycarbophil  625 mg Oral Horne  . vitamin C  1,000 mg Oral Horne    Review of Systems - unable to obtain  OBJECTIVE: Temp:  [97.5 F (36.4 C)-99.9 F (37.7 C)] 98.3 F (36.8 C) (05/28 1243) Pulse Rate:  [80-95] 95 (05/28 1243) Resp:  [17-26] 20 (05/28 1243) BP: (96-129)/(42-67) 128/64 (05/28 1243) SpO2:  [95 %-100 %] 98 % (05/28 1243) Weight:  [58.8 kg (129 lb 11.2 oz)] 58.8 kg (129 lb 11.2 oz) (05/28 0509) Physical Exam  Constitutional:  Awake, itneractive but unable to provide history. HENT: [place]/AT, PERRLA, no scleral icterus Mouth/Throat: Oropharynx is clear and moist. No oropharyngeal exudate.  Cardiovascular: Normal rate, regular rhythm and normal heart sounds Pulmonary/Chest: Effort normal and breath sounds normal. No respiratory distress.  has no wheezes.  Neck = supple, no nuchal rigidity R chest wall site nml post removal PC  Abdominal: Soft. Bowel sounds are normal.  exhibits no distension. There is no tenderness.  Lymphadenopathy: no cervical adenopathy. No axillary adenopathy Neurological: pleasantly demented Skin: Skin is warm and dry. No rash noted. No erythema.  Psychiatric: a normal mood and affect.  behavior is normal.    LABS: Results for orders placed or performed during the hospital encounter of 04/12/18 (from the past 48 hour(s))  Urine culture     Status: Abnormal (Preliminary result)   Collection Time: 04/12/18  8:50 PM  Result Value Ref Range   Specimen Description      URINE, CATHETERIZED Performed at [facility], [address] [place], [place] [address]    Special Requests      NONE Performed at [facility], [address] [place], [place] [address]    Culture (A)     >=100,000 COLONIES/mL KLEBSIELLA PNEUMONIAE SUSCEPTIBILITIES TO FOLLOW Performed at [place] [address], [place],  [address]    Report Status PENDING   Comprehensive metabolic panel     Status: Abnormal   Collection Time: 04/12/18  8:53 PM  Result Value Ref Range   Sodium 131 (L) 135 - 145 mmol/L   Potassium 4.4 3.5 - 5.1 mmol/L   Chloride 104 101 - 111 mmol/L   CO2 18 (L) 22 - 32 mmol/L   Glucose, Bld 162 (H) 65 - 99 mg/dL   BUN 44 (H) 6 - 20 mg/dL   Creatinine, Ser 1.28 (H) 0.44 - 1.00 mg/dL   Calcium 9.7 8.9 - 10.3 mg/dL   Total Protein 6.8 6.5 -  8.1 g/dL   Albumin 3.8 3.5 - 5.0 g/dL   AST 37 15 - 41 U/L   ALT 20 14 - 54 U/L   Alkaline Phosphatase 39 38 - 126 U/L   Total Bilirubin 1.6 (H) 0.3 - 1.2 mg/dL   GFR calc non Af Amer 36 (L) >60 mL/min   GFR calc Af Amer 42 (L) >60 mL/min    Comment: (NOTE) The eGFR has been calculated using the CKD EPI equation. This calculation has not been validated in all clinical situations. eGFR's persistently <60 mL/min signify possible Chronic Kidney Disease.    Anion gap 9 5 - 15    Comment: Performed at [facility], [address] [place], [place] [address]  CBC WITH DIFFERENTIAL     Status: Abnormal   Collection Time: 04/12/18  8:53 PM  Result Value Ref Range   WBC 8.8 3.6 - 11.0 K/uL   RBC 3.14 (L) 3.80 - 5.20 MIL/uL   Hemoglobin 9.9 (L) 12.0 - 16.0 g/dL   HCT 28.2 (L) 35.0 - 47.0 %   MCV 89.7 80.0 - 100.0 fL   MCH 31.4 26.0 - 34.0 pg   MCHC 35.0 32.0 - 36.0 g/dL   RDW 17.2 (H) 11.5 - 14.5 %   Platelets 101 (L) 150 - 440 K/uL   Neutrophils Relative % 93 %   Lymphocytes Relative 5 %   Monocytes Relative 2 %   Eosinophils Relative 0 %   Basophils Relative 0 %   Neutro Abs 8.2 (H) 1.4 - 6.5 K/uL   Lymphs Abs 0.4 (L) 1.0 - 3.6 K/uL   Monocytes Absolute 0.2 0.2 - 0.9 K/uL   Eosinophils Absolute 0.0 0 - 0.7 K/uL   Basophils Absolute 0.0 0 - 0.1 K/uL   RBC Morphology MIXED RBC POPULATION     Comment: POLYCHROMASIA PRESENT BASOPHILIC STIPPLING Performed at [facility], [address], [place], [place] [address]   Blood Culture (routine x 2)     Status: Abnormal (Preliminary result)   Collection Time: 04/12/18  8:53 PM  Result Value Ref Range   Specimen Description      BLOOD PORTA CATH Performed at [facility], [address], [place], [place] [address]    Special Requests      BOTTLES DRAWN AEROBIC AND ANAEROBIC Blood Culture adequate volume Performed at [facility], [address] [place], [place] [address]    Culture  Setup Time      GRAM POSITIVE COCCI IN CLUSTERS IN BOTH AEROBIC AND ANAEROBIC BOTTLES CRITICAL RESULT CALLED TO, READ BACK BY AND VERIFIED WITH: RODNEY GRUBB AT 3716 ON 04/13/18 BY SNJ    Culture (A)     STAPHYLOCOCCUS AUREUS SUSCEPTIBILITIES TO FOLLOW Performed at [facility], [address] [place], [place] [address]    Report Status PENDING   Urinalysis, Routine w reflex microscopic     Status: Abnormal   Collection Time: 04/12/18  8:53 PM  Result Value Ref Range   Color, Urine AMBER (A) YELLOW    Comment: BIOCHEMICALS MAY BE AFFECTED BY COLOR   APPearance CLOUDY (A) CLEAR   Specific Gravity, Urine 1.018 1.005 - 1.030   pH 7.0 5.0 - 8.0   Glucose, UA NEGATIVE NEGATIVE mg/dL   Hgb urine dipstick SMALL (A) NEGATIVE   Bilirubin Urine NEGATIVE NEGATIVE   Ketones, ur NEGATIVE NEGATIVE mg/dL   Protein, ur 100 (A) NEGATIVE mg/dL   Nitrite NEGATIVE NEGATIVE   Leukocytes, UA MODERATE (  A) NEGATIVE   RBC / HPF 11-20 0 - 5 RBC/hpf   WBC, UA 21-50 0 - 5 WBC/hpf   Bacteria, UA MANY (A) NONE SEEN   Squamous Epithelial / LPF NONE SEEN 0 - 5   WBC Clumps PRESENT    Mucus PRESENT     Comment: Performed at [facility], [address] [place], [place] [address]  Lactic acid, plasma     Status: Abnormal   Collection Time: 04/12/18  8:53 PM  Result Value Ref Range   Lactic Acid, Venous 3.9 (HH) 0.5 - 1.9 mmol/L    Comment: CRITICAL RESULT CALLED TO, READ BACK BY AND VERIFIED WITH IRIS RASLAWSKI  AT 2136 ON 04/12/18 RWW Performed at [facility], [address] [place], [place] [address]   Troponin I     Status: Abnormal   Collection Time: 04/12/18  8:53 PM  Result Value Ref Range   Troponin I 0.37 (HH) <0.03 ng/mL    Comment: CRITICAL RESULT CALLED TO, READ BACK BY AND VERIFIED WITH IRIR RASLAWSKI AT 2138 ON 04/12/18 RWW Performed at [facility], [address] [place], [place] [address]   Blood Culture ID Panel (Reflexed)     Status: Abnormal   Collection Time: 04/12/18  8:53 PM  Result Value Ref Range   Enterococcus species NOT DETECTED NOT DETECTED   Listeria monocytogenes NOT DETECTED NOT DETECTED   Staphylococcus species DETECTED (A) NOT DETECTED    Comment: CRITICAL RESULT CALLED TO, READ BACK BY AND VERIFIED WITH: RODNEY GRUBB AT 0729 ON 04/13/18 BY SNJ    Staphylococcus aureus DETECTED (A) NOT DETECTED    Comment: Methicillin (oxacillin) susceptible Staphylococcus aureus (MSSA). Preferred therapy is anti staphylococcal beta lactam antibiotic (Cefazolin or Nafcillin), unless clinically contraindicated. CRITICAL RESULT CALLED TO, READ BACK BY AND VERIFIED WITH: RODNEY GRUBB AT 3254 ON 04/13/18 BY SNJ    Methicillin resistance NOT DETECTED NOT DETECTED   Streptococcus species NOT DETECTED NOT DETECTED   Streptococcus agalactiae NOT DETECTED NOT DETECTED   Streptococcus pneumoniae NOT DETECTED NOT DETECTED   Streptococcus pyogenes NOT DETECTED NOT DETECTED   Acinetobacter baumannii NOT DETECTED NOT DETECTED   Enterobacteriaceae species NOT DETECTED NOT DETECTED   Enterobacter cloacae complex NOT DETECTED NOT DETECTED   Escherichia coli NOT DETECTED NOT DETECTED   Klebsiella oxytoca NOT DETECTED NOT DETECTED   Klebsiella pneumoniae NOT DETECTED NOT DETECTED   Proteus species NOT DETECTED NOT DETECTED   Serratia marcescens NOT DETECTED NOT DETECTED   Haemophilus influenzae NOT DETECTED NOT DETECTED   Neisseria meningitidis NOT DETECTED NOT DETECTED    Pseudomonas aeruginosa NOT DETECTED NOT DETECTED   Candida albicans NOT DETECTED NOT DETECTED   Candida glabrata NOT DETECTED NOT DETECTED   Candida krusei NOT DETECTED NOT DETECTED   Candida parapsilosis NOT DETECTED NOT DETECTED   Candida tropicalis NOT DETECTED NOT DETECTED    Comment: Performed at [facility], [address] [place], [place] [address]  Blood Culture (routine x 2)     Status: Abnormal (Preliminary result)   Collection Time: 04/12/18  8:54 PM  Result Value Ref Range   Specimen Description      BLOOD RIGHT ANTECUBITAL Performed at [facility], [address] [place], [place] [address]    Special Requests      BOTTLES DRAWN AEROBIC AND ANAEROBIC Blood Culture adequate volume Performed at [facility], [address], [place], [place] [address]    Culture  Setup Time  GRAM POSITIVE COCCI IN CLUSTERS IN BOTH AEROBIC AND ANAEROBIC BOTTLES CRITICAL VALUE NOTED.  VALUE IS CONSISTENT WITH PREVIOUSLY REPORTED AND CALLED VALUE. Performed at [facility], [address] [place], [place] [address]    Culture STAPHYLOCOCCUS AUREUS (A)    Report Status PENDING   Lactic acid, plasma     Status: Abnormal   Collection Time: 04/12/18 11:55 PM  Result Value Ref Range   Lactic Acid, Venous 5.0 (HH) 0.5 - 1.9 mmol/L    Comment: CRITICAL RESULT CALLED TO, READ BACK BY AND VERIFIED WITH DRUSILLA JACKSON AT 0035 ON 04/13/18 RWW Performed at [facility], [address] [place], [place] [address]   Lactic acid, plasma     Status: Abnormal   Collection Time: 04/13/18  2:11 AM  Result Value Ref Range   Lactic Acid, Venous 3.0 (HH) 0.5 - 1.9 mmol/L    Comment: CRITICAL RESULT CALLED TO, READ BACK BY AND VERIFIED WITH DRUSILLA JACKSON AT 0301 ON 04/13/18 RWW Performed at [facility], [address] [place], [place] [address]   Basic metabolic panel     Status: Abnormal   Collection Time: 04/13/18  2:17 AM   Result Value Ref Range   Sodium 132 (L) 135 - 145 mmol/L   Potassium 4.0 3.5 - 5.1 mmol/L   Chloride 108 101 - 111 mmol/L   CO2 17 (L) 22 - 32 mmol/L   Glucose, Bld 148 (H) 65 - 99 mg/dL   BUN 39 (H) 6 - 20 mg/dL   Creatinine, Ser 1.19 (H) 0.44 - 1.00 mg/dL   Calcium 7.9 (L) 8.9 - 10.3 mg/dL   GFR calc non Af Amer 39 (L) >60 mL/min   GFR calc Af Amer 46 (L) >60 mL/min    Comment: (NOTE) The eGFR has been calculated using the CKD EPI equation. This calculation has not been validated in all clinical situations. eGFR's persistently <60 mL/min signify possible Chronic Kidney Disease.    Anion gap 7 5 - 15    Comment: Performed at [facility], [address] [place], [place] [address]  CBC     Status: Abnormal   Collection Time: 04/13/18  2:17 AM  Result Value Ref Range   WBC 8.1 3.6 - 11.0 K/uL   RBC 2.63 (L) 3.80 - 5.20 MIL/uL   Hemoglobin 8.1 (L) 12.0 - 16.0 g/dL   HCT 23.8 (L) 35.0 - 47.0 %   MCV 90.6 80.0 - 100.0 fL   MCH 31.0 26.0 - 34.0 pg   MCHC 34.2 32.0 - 36.0 g/dL   RDW 17.5 (H) 11.5 - 14.5 %   Platelets 73 (L) 150 - 440 K/uL    Comment: Performed at [facility], [address] [place], [place] [address]  Troponin I     Status: Abnormal   Collection Time: 04/13/18  2:17 AM  Result Value Ref Range   Troponin I 0.45 (HH) <0.03 ng/mL    Comment: CRITICAL VALUE NOTED. VALUE IS CONSISTENT WITH PREVIOUSLY REPORTED/CALLED VALUE RWW Performed at [facility], [address] [place], [place] [address]   Lactic acid, plasma     Status: Abnormal   Collection Time: 04/13/18  5:25 AM  Result Value Ref Range   Lactic Acid, Venous 2.9 (HH) 0.5 - 1.9 mmol/L    Comment: CRITICAL RESULT CALLED TO, READ BACK BY AND VERIFIED WITH  DRUSCILLA JACKSON AT 4268 04/13/18 SDR Performed at [facility], [address] [place], [place] [address]  Glucose, capillary     Status: Abnormal   Collection Time: 04/13/18  7:51 AM  Result Value Ref  Range   Glucose-Capillary 140 (H) 65 - 99 mg/dL  Cath Tip Culture     Status: None (Preliminary result)   Collection Time: 04/13/18  4:59 PM  Result Value Ref Range   Specimen Description      CATH TIP Performed at [facility], [address], [place], [place] [address]    Special Requests      NONE Performed at [facility], [address] [place], [place] [address]    Culture      NO GROWTH < 24 HOURS Performed at [facility], [place] [address], [place], [place] [address]    Report Status PENDING   Creatinine, serum     Status: Abnormal   Collection Time: 04/14/18  5:32 AM  Result Value Ref Range   Creatinine, Ser 1.00 0.44 - 1.00 mg/dL   GFR calc non Af Amer 48 (L) >60 mL/min   GFR calc Af Amer 56 (L) >60 mL/min    Comment: (NOTE) The eGFR has been calculated using the CKD EPI equation. This calculation has not been validated in all clinical situations. eGFR's persistently <60 mL/min signify possible Chronic Kidney Disease. Performed at [facility], [address] [place], [place] [address]   Glucose, capillary     Status: Abnormal   Collection Time: 04/14/18  7:37 AM  Result Value Ref Range   Glucose-Capillary 130 (H) 65 - 99 mg/dL   No components found for: ESR, C REACTIVE PROTEIN MICRO: Recent Results (from the past 720 hour(s))  Urine culture     Status: Abnormal (Preliminary result)   Collection Time: 04/12/18  8:50 PM  Result Value Ref Range Status   Specimen Description   Final    URINE, CATHETERIZED Performed at [facility], [address] [place], [place] [address]    Special Requests   Final    NONE Performed at [facility], [address] [place], [place] [address]    Culture (A)  Final    >=100,000 COLONIES/mL KLEBSIELLA PNEUMONIAE SUSCEPTIBILITIES TO FOLLOW Performed at [facility], [address] [address], [place], [place] [address]    Report Status PENDING  Incomplete  Blood Culture  (routine x 2)     Status: Abnormal (Preliminary result)   Collection Time: 04/12/18  8:53 PM  Result Value Ref Range Status   Specimen Description   Final    BLOOD PORTA CATH Performed at [facility], [address] [place], [place] [address]    Special Requests   Final    BOTTLES DRAWN AEROBIC AND ANAEROBIC Blood Culture adequate volume Performed at [facility], [address] [place], [place] [address]    Culture  Setup Time   Final    GRAM POSITIVE COCCI IN CLUSTERS IN BOTH AEROBIC AND ANAEROBIC BOTTLES CRITICAL RESULT CALLED TO, READ BACK BY AND VERIFIED WITH: RODNEY GRUBB AT 2440 ON 04/13/18 BY SNJ    Culture (A)  Final    STAPHYLOCOCCUS AUREUS SUSCEPTIBILITIES TO FOLLOW Performed at [facility], [place] [address] [place], [place] [address]    Report Status PENDING  Incomplete  Blood Culture ID Panel (Reflexed)     Status: Abnormal   Collection Time: 04/12/18  8:53 PM  Result Value Ref Range Status   Enterococcus species NOT DETECTED NOT DETECTED Final   Listeria monocytogenes NOT DETECTED  NOT DETECTED Final   Staphylococcus species DETECTED (A) NOT DETECTED Final    Comment: CRITICAL RESULT CALLED TO, READ BACK BY AND VERIFIED WITH: RODNEY GRUBB AT 0729 ON 04/13/18 BY SNJ    Staphylococcus aureus DETECTED (A) NOT DETECTED Final    Comment: Methicillin (oxacillin) susceptible Staphylococcus aureus (MSSA). Preferred therapy is anti staphylococcal beta lactam antibiotic (Cefazolin or Nafcillin), unless clinically contraindicated. CRITICAL RESULT CALLED TO, READ BACK BY AND VERIFIED WITH: RODNEY GRUBB AT 2229 ON 04/13/18 BY SNJ    Methicillin resistance NOT DETECTED NOT DETECTED Final   Streptococcus species NOT DETECTED NOT DETECTED Final   Streptococcus agalactiae NOT DETECTED NOT DETECTED Final   Streptococcus pneumoniae NOT DETECTED NOT DETECTED Final   Streptococcus pyogenes NOT DETECTED NOT DETECTED Final   Acinetobacter baumannii NOT  DETECTED NOT DETECTED Final   Enterobacteriaceae species NOT DETECTED NOT DETECTED Final   Enterobacter cloacae complex NOT DETECTED NOT DETECTED Final   Escherichia coli NOT DETECTED NOT DETECTED Final   Klebsiella oxytoca NOT DETECTED NOT DETECTED Final   Klebsiella pneumoniae NOT DETECTED NOT DETECTED Final   Proteus species NOT DETECTED NOT DETECTED Final   Serratia marcescens NOT DETECTED NOT DETECTED Final   Haemophilus influenzae NOT DETECTED NOT DETECTED Final   Neisseria meningitidis NOT DETECTED NOT DETECTED Final   Pseudomonas aeruginosa NOT DETECTED NOT DETECTED Final   Candida albicans NOT DETECTED NOT DETECTED Final   Candida glabrata NOT DETECTED NOT DETECTED Final   Candida krusei NOT DETECTED NOT DETECTED Final   Candida parapsilosis NOT DETECTED NOT DETECTED Final   Candida tropicalis NOT DETECTED NOT DETECTED Final    Comment: Performed at [facility], [address] [place], [place] [address]  Blood Culture (routine x 2)     Status: Abnormal (Preliminary result)   Collection Time: 04/12/18  8:54 PM  Result Value Ref Range Status   Specimen Description   Final    BLOOD RIGHT ANTECUBITAL Performed at [facility], [address] [place], [place] [address]    Special Requests   Final    BOTTLES DRAWN AEROBIC AND ANAEROBIC Blood Culture adequate volume Performed at [facility], [address] [place], [place] [address]    Culture  Setup Time   Final    GRAM POSITIVE COCCI IN CLUSTERS IN BOTH AEROBIC AND ANAEROBIC BOTTLES CRITICAL VALUE NOTED.  VALUE IS CONSISTENT WITH PREVIOUSLY REPORTED AND CALLED VALUE. Performed at [facility], [address] [place], [place] [address]    Culture STAPHYLOCOCCUS AUREUS (A)  Final   Report Status PENDING  Incomplete  Cath Tip Culture     Status: None (Preliminary result)   Collection Time: 04/13/18  4:59 PM  Result Value Ref Range Status   Specimen Description   Final    CATH  TIP Performed at [facility], [address] [place], [place] [address]    Special Requests   Final    NONE Performed at [facility], [address] [place], [place] [address]    Culture   Final    NO GROWTH < 24 HOURS Performed at [facility], [place] [address], [place], [place] [address]    Report Status PENDING  Incomplete    IMAGING: Dg Chest Port 1 View  Result Date: 04/12/2018 CLINICAL DATA:  Fever EXAM: PORTABLE CHEST 1 VIEW COMPARISON:  January 01, 2018 FINDINGS: Port-A-Cath tip is in the superior vena cava. No pneumothorax. There is no appreciable edema or consolidation.  The heart size and pulmonary vascularity are normal. No adenopathy. There is aortic atherosclerosis. IMPRESSION: No edema or consolidation.  There is aortic atherosclerosis. Aortic Atherosclerosis (ICD10-I70.0). Electronically Signed   By: Lowella Grip III M.D.   On: 04/12/2018 21:34   Dg Hip Unilat With Pelvis 2-3 Views Left  Result Date: 04/13/2018 CLINICAL DATA:  C/o left hip pain since last night; family states no injury; pain when palpitating her hip EXAM: DG HIP (WITH OR WITHOUT PELVIS) 2-3V LEFT COMPARISON:  None. FINDINGS: No fracture.  No bone lesion. Hip joints, SI joints and symphysis pubis are normally spaced and aligned. There is significant arthropathic change. Bones are demineralized. IMPRESSION: 1. No fracture, bone lesion or significant joint abnormality. Electronically Signed   By: Lajean Manes M.D.   On: 04/13/2018 14:25   - Left ventricle: The cavity size was normal. There was mild   concentric hypertrophy. Systolic function was normal. The   estimated ejection fraction was in the range of 55% to 60%. Wall   motion was normal; there were no regional wall motion   abnormalities. Doppler parameters are consistent with abnormal   left ventricular relaxation (grade 1 diastolic dysfunction). - Aortic valve: There was mild stenosis. There was mild to moderate    regurgitation. Mean gradient (S): 6 mm Hg. Valve area (VTI): 1.93   cm^2. - Mitral valve: Mildly calcified annulus. There was mild   regurgitation. Impressions: - no obvious vegetation.  Assessment:   Chelsea Horne is a 84 y.o. female with history of dementia, and myelodysplastic syndrome admitted with sepsis due to MSSA bacteremia likely from skin source on LE scab. . Clinically improving, portacath removed, fu cx NGTD and TTE negative. I discussed the case with her niece and with the patient as well as Dr Posey Pronto. I would be loath to put her through a TEE . She has improved clinically, fu bcx neg from 5/28 and cath removed.   Recommendations Can place a PICC after 48 hours if bcx remain negative.  Would rec a 28 day course if IV ancef- see abx order sheet. Thank you very much for allowing me to participate in the care of this patient. Please call with questions.   Cheral Marker. Ola Spurr, MD

## 2018-04-15 ENCOUNTER — Inpatient Hospital Stay: Payer: Self-pay

## 2018-04-15 LAB — URINE CULTURE

## 2018-04-15 LAB — MAGNESIUM: Magnesium: 1.7 mg/dL (ref 1.7–2.4)

## 2018-04-15 LAB — TROPONIN I
TROPONIN I: 0.37 ng/mL — AB (ref <0.03)
Troponin I: 0.47 ng/mL (ref <0.03)

## 2018-04-15 LAB — CULTURE, BLOOD (ROUTINE X 2)
SPECIAL REQUESTS: ADEQUATE
Special Requests: ADEQUATE

## 2018-04-15 LAB — BASIC METABOLIC PANEL
ANION GAP: 5 (ref 5–15)
BUN: 38 mg/dL — ABNORMAL HIGH (ref 6–20)
CALCIUM: 7.9 mg/dL — AB (ref 8.9–10.3)
CO2: 15 mmol/L — AB (ref 22–32)
CREATININE: 0.88 mg/dL (ref 0.44–1.00)
Chloride: 114 mmol/L — ABNORMAL HIGH (ref 101–111)
GFR calc Af Amer: >60 mL/min (ref >60)
GFR, EST NON AFRICAN AMERICAN: 57 mL/min — AB (ref >60)
GLUCOSE: 107 mg/dL — AB (ref 65–99)
Potassium: 3.7 mmol/L (ref 3.5–5.1)
Sodium: 134 mmol/L — ABNORMAL LOW (ref 135–145)

## 2018-04-15 LAB — GLUCOSE, CAPILLARY: Glucose-Capillary: 105 mg/dL — ABNORMAL HIGH (ref 65–99)

## 2018-04-15 LAB — HEMOGLOBIN: HEMOGLOBIN: 7.7 g/dL — AB (ref 12.0–16.0)

## 2018-04-15 MED ORDER — HYDRALAZINE HCL 20 MG/ML IJ SOLN: 10.0000 mg | INTRAMUSCULAR | Status: DC | PRN

## 2018-04-15 MED ORDER — SODIUM CHLORIDE 0.9 % IV SOLN: Freq: Once | INTRAVENOUS | Status: DC

## 2018-04-15 MED ORDER — MORPHINE SULFATE (PF) 2 MG/ML IV SOLN
2.0000 mg | Freq: Once | INTRAVENOUS | Status: AC
  Administered 2018-04-15: 2 mg via INTRAVENOUS
  Filled 2018-04-15: qty 1

## 2018-04-15 MED ORDER — DEXTROSE 5 % IV SOLN
1000.0000 mg | Freq: Once | INTRAVENOUS | Status: DC
  Administered 2018-04-16: 22:00:00 1000 mg via INTRAVENOUS
  Filled 2018-04-15 (×2): qty 1000

## 2018-04-15 MED ORDER — METOPROLOL TARTRATE 25 MG PO TABS
25.0000 mg | ORAL_TABLET | Freq: Two times a day (BID) | ORAL | Status: DC
Start: 2018-04-15 — End: 2018-04-20
  Administered 2018-04-15: 25 mg via ORAL
  Filled 2018-04-15 (×3): qty 1

## 2018-04-15 NOTE — Progress Notes (Signed)
[place] INFECTIOUS DISEASE PROGRESS NOTE Date of Admission:  04/12/2018     ID: Chelsea Horne is a 84 y.o. female with MSSA bacteremia Active Problems:   Sepsis ([place])   Subjective: No fevers. Daughter at bedside, seen by PT who rec rehab  ROS  Unable to obtain  Medications:  Antibiotics Given (last 72 hours)    Date/Time Action Medication Dose Rate   04/12/18 2129 New Bag/Given   aztreonam (AZACTAM) 2 g in sodium chloride 0.9 % 100 mL IVPB 2 g 200 mL/hr   04/12/18 2130 New Bag/Given   levofloxacin (LEVAQUIN) IVPB 750 mg 750 mg 100 mL/hr   04/12/18 2134 New Bag/Given  [Per Donna Christen, pharmD compatible with levaquin]   vancomycin (VANCOCIN) IVPB 1000 mg/200 mL premix 1,000 mg 200 mL/hr   04/13/18 0133 New Bag/Given  [dispensing delay]   ceFEPIme (MAXIPIME) 2 g in sodium chloride 0.9 % 100 mL IVPB 2 g 200 mL/hr   04/13/18 1107 New Bag/Given   ceFAZolin (ANCEF) IVPB 1 g/50 mL premix 1 g 100 mL/hr   04/13/18 2124 New Bag/Given   ceFAZolin (ANCEF) IVPB 1 g/50 mL premix 1 g 100 mL/hr   04/14/18 0926 New Bag/Given   ceFAZolin (ANCEF) IVPB 2g/100 mL premix 2 g 200 mL/hr   04/14/18 1856 New Bag/Given   ceFAZolin (ANCEF) IVPB 2g/100 mL premix 2 g 200 mL/hr   04/15/18 0219 New Bag/Given   ceFAZolin (ANCEF) IVPB 2g/100 mL premix 2 g 200 mL/hr     . aspirin EC  81 mg Oral Daily  . calcium-vitamin D  1 tablet Oral BID  . docusate sodium  100 mg Oral BID  . gabapentin  100 mg Oral QHS  . heparin  5,000 Units Subcutaneous Q8H  . megestrol  20 mg Oral Daily  . metoprolol tartrate  25 mg Oral BID  . multivitamin with minerals  1 tablet Oral Daily  . pneumococcal 23 valent vaccine  0.5 mL Intramuscular Tomorrow-1000  . polycarbophil  625 mg Oral Daily  . vitamin C  1,000 mg Oral Daily    Objective: Vital signs in last 24 hours: Temp:  [98.2 F (36.8 C)] 98.2 F (36.8 C) (05/28 2012) Pulse Rate:  [93-136] 136 (05/29 1218) Resp:  [18] 18 (05/28 2012) BP:  (123-153)/(55-62) 123/62 (05/29 0430) SpO2:  [98 %] 98 % (05/29 0430) Weight:  [58.9 kg (129 lb 14.4 oz)] 58.9 kg (129 lb 14.4 oz) (05/29 0430) Constitutional:  Awake, itneractive but unable to provide history. HENT: [place]/AT, PERRLA, no scleral icterus Mouth/Throat: Oropharynx is clear and moist. No oropharyngeal exudate.  Cardiovascular: Normal rate, regular rhythm and normal heart sounds Pulmonary/Chest: Effort normal and breath sounds normal. No respiratory distress.  has no wheezes.  Neck = supple, no nuchal rigidity R chest wall site nml post removal PC  Abdominal: Soft. Bowel sounds are normal.  exhibits no distension. There is no tenderness.  Lymphadenopathy: no cervical adenopathy. No axillary adenopathy Neurological: pleasantly demented Skin: Skin is warm and dry. No rash noted. No erythema.  Psychiatric: a normal mood and affect.  behavior is normal.   Lab Results Recent Labs    04/12/18 2053 04/13/18 0217 04/14/18 0532 04/15/18 0523 04/15/18 0531  WBC 8.8 8.1  --   --   --   HGB 9.9* 8.1*  --  7.7*  --   HCT 28.2* 23.8*  --   --   --   NA 131* 132*  --   --  134*  K 4.4 4.0  --   --  3.7  CL 104 108  --   --  114*  CO2 18* 17*  --   --  15*  BUN 44* 39*  --   --  38*  CREATININE 1.28* 1.19* 1.00  --  0.88    Microbiology: Results for orders placed or performed during the hospital encounter of 04/12/18  Urine culture     Status: Abnormal   Collection Time: 04/12/18  8:50 PM  Result Value Ref Range Status   Specimen Description   Final    URINE, CATHETERIZED Performed at [facility], [address], [place], [place] [address]    Special Requests   Final    NONE Performed at [facility], [address] [place], [place] [address]    Culture >=100,000 COLONIES/mL KLEBSIELLA PNEUMONIAE (A)  Final   Report Status 04/15/2018 FINAL  Final   Organism ID, Bacteria KLEBSIELLA PNEUMONIAE (A)  Final      Susceptibility   Klebsiella pneumoniae -  MIC*    AMPICILLIN >=32 RESISTANT Resistant     CEFAZOLIN <=4 SENSITIVE Sensitive     CEFTRIAXONE <=1 SENSITIVE Sensitive     CIPROFLOXACIN 0.5 SENSITIVE Sensitive     GENTAMICIN <=1 SENSITIVE Sensitive     IMIPENEM <=0.25 SENSITIVE Sensitive     NITROFURANTOIN 32 SENSITIVE Sensitive     TRIMETH/SULFA <=20 SENSITIVE Sensitive     AMPICILLIN/SULBACTAM 4 SENSITIVE Sensitive     PIP/TAZO <=4 SENSITIVE Sensitive     Extended ESBL NEGATIVE Sensitive     * >=100,000 COLONIES/mL KLEBSIELLA PNEUMONIAE  Blood Culture (routine x 2)     Status: Abnormal   Collection Time: 04/12/18  8:53 PM  Result Value Ref Range Status   Specimen Description   Final    BLOOD PORTA CATH Performed at [facility], [address] [place], [place] [address]    Special Requests   Final    BOTTLES DRAWN AEROBIC AND ANAEROBIC Blood Culture adequate volume Performed at [facility], [address] [place], [place] [address]    Culture  Setup Time   Final    GRAM POSITIVE COCCI IN CLUSTERS IN BOTH AEROBIC AND ANAEROBIC BOTTLES CRITICAL RESULT CALLED TO, READ BACK BY AND VERIFIED WITH: RODNEY GRUBB AT 0175 ON 04/13/18 BY SNJ Performed at [facility], [place] [address], [place], [place] [address]    Culture STAPHYLOCOCCUS AUREUS (A)  Final   Report Status 04/15/2018 FINAL  Final   Organism ID, Bacteria STAPHYLOCOCCUS AUREUS  Final      Susceptibility   Staphylococcus aureus - MIC*    CIPROFLOXACIN <=0.5 SENSITIVE Sensitive     ERYTHROMYCIN <=0.25 SENSITIVE Sensitive     GENTAMICIN <=0.5 SENSITIVE Sensitive     OXACILLIN <=0.25 SENSITIVE Sensitive     TETRACYCLINE <=1 SENSITIVE Sensitive     VANCOMYCIN 1 SENSITIVE Sensitive     TRIMETH/SULFA <=10 SENSITIVE Sensitive     CLINDAMYCIN <=0.25 SENSITIVE Sensitive     RIFAMPIN <=0.5 SENSITIVE Sensitive     Inducible Clindamycin NEGATIVE Sensitive     * STAPHYLOCOCCUS AUREUS  Blood Culture ID Panel (Reflexed)     Status: Abnormal    Collection Time: 04/12/18  8:53 PM  Result Value Ref Range Status   Enterococcus species NOT DETECTED NOT DETECTED Final   Listeria monocytogenes NOT DETECTED NOT DETECTED Final   Staphylococcus species DETECTED (A) NOT DETECTED Final    Comment: CRITICAL RESULT CALLED  TO, READ BACK BY AND VERIFIED WITH: RODNEY GRUBB AT 0729 ON 04/13/18 BY SNJ    Staphylococcus aureus DETECTED (A) NOT DETECTED Final    Comment: Methicillin (oxacillin) susceptible Staphylococcus aureus (MSSA). Preferred therapy is anti staphylococcal beta lactam antibiotic (Cefazolin or Nafcillin), unless clinically contraindicated. CRITICAL RESULT CALLED TO, READ BACK BY AND VERIFIED WITH: RODNEY GRUBB AT 9562 ON 04/13/18 BY SNJ    Methicillin resistance NOT DETECTED NOT DETECTED Final   Streptococcus species NOT DETECTED NOT DETECTED Final   Streptococcus agalactiae NOT DETECTED NOT DETECTED Final   Streptococcus pneumoniae NOT DETECTED NOT DETECTED Final   Streptococcus pyogenes NOT DETECTED NOT DETECTED Final   Acinetobacter baumannii NOT DETECTED NOT DETECTED Final   Enterobacteriaceae species NOT DETECTED NOT DETECTED Final   Enterobacter cloacae complex NOT DETECTED NOT DETECTED Final   Escherichia coli NOT DETECTED NOT DETECTED Final   Klebsiella oxytoca NOT DETECTED NOT DETECTED Final   Klebsiella pneumoniae NOT DETECTED NOT DETECTED Final   Proteus species NOT DETECTED NOT DETECTED Final   Serratia marcescens NOT DETECTED NOT DETECTED Final   Haemophilus influenzae NOT DETECTED NOT DETECTED Final   Neisseria meningitidis NOT DETECTED NOT DETECTED Final   Pseudomonas aeruginosa NOT DETECTED NOT DETECTED Final   Candida albicans NOT DETECTED NOT DETECTED Final   Candida glabrata NOT DETECTED NOT DETECTED Final   Candida krusei NOT DETECTED NOT DETECTED Final   Candida parapsilosis NOT DETECTED NOT DETECTED Final   Candida tropicalis NOT DETECTED NOT DETECTED Final    Comment: Performed at [facility],  [address] [place], [place] [address]  Blood Culture (routine x 2)     Status: Abnormal   Collection Time: 04/12/18  8:54 PM  Result Value Ref Range Status   Specimen Description   Final    BLOOD RIGHT ANTECUBITAL Performed at [facility], [address] [place], [place] [address]    Special Requests   Final    BOTTLES DRAWN AEROBIC AND ANAEROBIC Blood Culture adequate volume Performed at [facility], [address] [place], [place] [address]    Culture  Setup Time   Final    GRAM POSITIVE COCCI IN CLUSTERS IN BOTH AEROBIC AND ANAEROBIC BOTTLES CRITICAL VALUE NOTED.  VALUE IS CONSISTENT WITH PREVIOUSLY REPORTED AND CALLED VALUE. Performed at [facility], [address] [place], [place] [address]    Culture (A)  Final    STAPHYLOCOCCUS AUREUS SUSCEPTIBILITIES PERFORMED ON PREVIOUS CULTURE WITHIN THE LAST 5 DAYS. Performed at [facility], [place] [address], [place], [place] [address]    Report Status 04/15/2018 FINAL  Final  Cath Tip Culture     Status: None (Preliminary result)   Collection Time: 04/13/18  4:59 PM  Result Value Ref Range Status   Specimen Description   Final    CATH TIP Performed at [facility], [address], [place], [place] [address]    Special Requests   Final    NONE Performed at [facility], [address], [place], [place] [address]    Culture   Final    NO GROWTH 2 DAYS Performed at [facility], [place] [address] [place], [place] [address]    Report Status PENDING  Incomplete  CULTURE, BLOOD (ROUTINE X 2) w Reflex to ID Panel     Status: None (Preliminary result)   Collection Time: 04/14/18  7:41 AM  Result Value Ref Range Status   Specimen Description BLOOD BLOOD RIGHT  WRIST  Final   Special Requests   Final    BOTTLES DRAWN AEROBIC AND ANAEROBIC Blood Culture adequate volume   Culture   Final    NO GROWTH < 24 HOURS Performed at [facility], [address] [place], [place] [address]    Report Status PENDING  Incomplete  CULTURE, BLOOD (ROUTINE X 2) w Reflex to ID Panel     Status: None (Preliminary result)   Collection Time: 04/14/18  8:00 AM  Result Value Ref Range Status   Specimen Description BLOOD BLOOD RIGHT HAND  Final   Special Requests   Final    BOTTLES DRAWN AEROBIC AND ANAEROBIC Blood Culture adequate volume   Culture   Final    NO GROWTH < 24 HOURS Performed at [facility], [address], [place], [place] [address]    Report Status PENDING  Incomplete    Studies/Results: Dg Hip Unilat With Pelvis 2-3 Views Left  Result Date: 04/13/2018 CLINICAL DATA:  C/o left hip pain since last night; family states no injury; pain when palpitating her hip EXAM: DG HIP (WITH OR WITHOUT PELVIS) 2-3V LEFT COMPARISON:  None. FINDINGS: No fracture.  No bone lesion. Hip joints, SI joints and symphysis pubis are normally spaced and aligned. There is significant arthropathic change. Bones are demineralized. IMPRESSION: 1. No fracture, bone lesion or significant joint abnormality. Electronically Signed   By: Lajean Manes M.D.   On: 04/13/2018 14:25   [place] Ekg Site Rite  Result Date: 04/15/2018 If Site Rite image not attached, placement could not be confirmed due to current cardiac rhythm.  [place] Ekg Site Rite  Result Date: 04/15/2018 If Site Rite image not attached, placement could not be confirmed due to current cardiac rhythm.   Assessment/Plan: Chelsea Horne is a 84 y.o. female with history of dementia, and myelodysplastic syndrome (portacath in place for blood products - follows with Dr Grayland Ormond)  admitted with sepsis due to MSSA bacteremia likely from skin source on LE scab. . Clinically improving, portacath removed, fu cx NGTD and TTE negative. I discussed the case with her niece and with the patient as well as Dr Posey Pronto. I would be loath to put her through a TEE . She has improved clinically, fu bcx neg from 5/28 and cath  removed.  Klebsiella in urine is S to cefazolin as well.  Recommendations Would rec a 28 day course if IV ancef- see abx order sheet. PICC can be placed After 4 weeks if bcx negative in follow up could consider placing another port as she seems to be dependent on blood products for her myelodysplasia.  I have messaged Dr Grayland Ormond. Thank you very much for the consult. Will follow with you.  Leonel Ramsay   04/15/2018, 1:48 PM

## 2018-04-15 NOTE — Care Management Important Message (Signed)
Important Message  Patient Details  Name: Chelsea Horne MRN: [id number] Date of Birth: 1927/10/19   Medicare Important Message Given:  Yes    Boaz Berisha A, RN 04/15/2018, 10:06 AM

## 2018-04-15 NOTE — Evaluation (Signed)
Physical Therapy Evaluation Patient Details Name: Chelsea Horne MRN: [id number] DOB: 03/16/1927 Today's Date: 04/15/2018   History of Present Illness  Pt is an89 y.o.femalewith a known history of COPD, hypertension, recurrent urinary tract infections and breast cancer status post remote mastectomy. Patient was brought to emergency room for acute mental status change with increased lethargy and generalized weakness. She was also noticed to be febrile at home. At the arrival to emergency room, patient was noted to have a temperature of 103.2.Blood pressure was low at 90/53.Heart rate was elevated in 120s.BP improved with IV fluids,the patient continued to remain tachycardic and febrile. Blood test done in the emergency room reveal elevated lactic acid at 3.9.Sodium level is 131.Level is 1.28. Troponin level is 0.37. UA is positive for UTI. X-ray, reviewed by myself is negative for any acute cardiopulmonary disease.  EKG showed sinus tachycardia  Patient is admitted for further evaluation and treatment.  Assessment includes: MSSA sepsis likely due to port infection, s/p port removal, acute UTI, acute enephalopathy, acute renal failure, elevated troponin in settng of sepsis likely demand ischemia, and a h/o MDS.    Clinical Impression  Pt presents with deficits in strength, transfers, mobility, gait, balance, and activity tolerance.  Pt required extensive cueing and assistance with all bed mobility tasks.  Upon sitting at EOB pt c/o dizziness and was returned to supine.  Pt's BP taken in supine at 125/48 mmHg without symptoms.  Pt returned to sitting again with decreased symptoms and BP taken in sitting at 121/70 mmHg.  Pt's HR remained in the 70s to 80s throughout the session, nursing notified.   Pt did present with poor static sitting balance with primarily L lateral and posterior instability that improved only minimally with R lateral and anterior weight shifting training.  Multiple  attempts made at standing from an elevated EOB with pt unable to stand and requesting to return to supine.  Overall pt presents with a significant decline in functional mobility and gait compared to her PLOF and will benefit from PT services in a SNF setting upon discharge to safely address above deficits for decreased caregiver assistance and eventual return to PLOF.      Follow Up Recommendations SNF;Supervision for mobility/OOB    Equipment Recommendations  None recommended by PT    Recommendations for Other Services       Precautions / Restrictions Precautions Precautions: Fall Restrictions Weight Bearing Restrictions: No      Mobility  Bed Mobility Overal bed mobility: Needs Assistance Bed Mobility: Supine to Sit;Sit to Supine     Supine to sit: Max assist Sit to supine: Max assist   General bed mobility comments: Extensive verbal and tactile cues for sequencing and max A for BLEs in and out of the bed and to come to full upright sitting  Transfers                 General transfer comment: Multiple attempts made to stand from an elevated EOB with pt unable  Ambulation/Gait             General Gait Details: Unable  Stairs            Wheelchair Mobility    Modified Rankin (Stroke Patients Only)       Balance Overall balance assessment: Needs assistance   Sitting balance-Leahy Scale: Poor Sitting balance - Comments: Frequent min to mod A to prevent L lateral LOB  Postural control: Left lateral lean     Standing balance  comment: Unable                             Pertinent Vitals/Pain Pain Assessment: No/denies pain    Home Living Family/patient expects to be discharged to:: Private residence Living Arrangements: Children Available Help at Discharge: Family;Personal care attendant;Available PRN/intermittently(PCA 10:30-1:30 and 3:30-6:30) Type of Home: House Home Access: Stairs to enter Entrance Stairs-Rails:  Left Entrance Stairs-Number of Steps: 5 Home Layout: One level Home Equipment: Walker - 2 wheels;Toilet riser;Grab bars - toilet;Hospital bed;Shower seat - built in      Prior Function Level of Independence: Needs assistance   Gait / Transfers Assistance Needed: Mod Ind amb in the home with a RW  ADL's / Homemaking Assistance Needed: Daughter and PCA assist with ADLs, meds, and meals        Hand Dominance        Extremity/Trunk Assessment   Upper Extremity Assessment Upper Extremity Assessment: Generalized weakness    Lower Extremity Assessment Lower Extremity Assessment: Generalized weakness       Communication      Cognition Arousal/Alertness: Lethargic Behavior During Therapy: Flat affect Overall Cognitive Status: History of cognitive impairments - at baseline                                        General Comments      Exercises     Assessment/Plan    PT Assessment Patient needs continued PT services  PT Problem List Decreased strength;Decreased activity tolerance;Decreased balance;Decreased knowledge of use of DME;Decreased mobility       PT Treatment Interventions DME instruction;Gait training;Stair training;Functional mobility training;Balance training;Therapeutic exercise;Therapeutic activities;Patient/family education    PT Goals (Current goals can be found in the Care Plan section)  Acute Rehab PT Goals PT Goal Formulation: Patient unable to participate in goal setting Time For Goal Achievement: 04/28/18 Potential to Achieve Goals: Fair    Frequency Min 2X/week   Barriers to discharge Inaccessible home environment;Decreased caregiver support      Co-evaluation               AM-PAC PT "6 Clicks" Daily Activity  Outcome Measure Difficulty turning over in bed (including adjusting bedclothes, sheets and blankets)?: Unable Difficulty moving from lying on back to sitting on the side of the bed? : Unable Difficulty  sitting down on and standing up from a chair with arms (e.g., wheelchair, bedside commode, etc,.)?: Unable Help needed moving to and from a bed to chair (including a wheelchair)?: Total Help needed walking in hospital room?: Total Help needed climbing 3-5 steps with a railing? : Total 6 Click Score: 6    End of Session Equipment Utilized During Treatment: Gait belt Activity Tolerance: Patient limited by fatigue Patient left: in bed;with bed alarm set;with call bell/phone within reach;with family/visitor present Nurse Communication: Mobility status;Other (comment)(Pt dizzy with sitting at EOB, see CI) PT Visit Diagnosis: Muscle weakness (generalized) (M62.81);Difficulty in walking, not elsewhere classified (R26.2)    Time: 9833-8250 PT Time Calculation (min) (ACUTE ONLY): 31 min   Charges:   PT Evaluation $PT Eval Low Complexity: 1 Low PT Treatments $Therapeutic Activity: 8-22 mins   PT G Codes:        DRoyetta Asal PT, DPT 04/15/18, 3:28 PM

## 2018-04-15 NOTE — Clinical Social Work Note (Signed)
Clinical Social Work Assessment  Patient Details  Name: Chelsea Horne MRN: [id number] Date of Birth: 08/01/1927  Date of referral:  04/15/18               Reason for consult:  Facility Placement                Permission sought to share information with:  Case Manager, Customer service manager, Family Supports Permission granted to share information::  Yes, Verbal Permission Granted  Name::      SNF   Agency::   [place] [facility]  Relationship::     Contact Information:     Housing/Transportation Living arrangements for the past 2 months:  Single Family Home Source of Information:  Adult Children Patient Interpreter Needed:  None Criminal Activity/Legal Involvement Pertinent to Current Situation/Hospitalization:  No - Comment as needed Significant Relationships:  Adult Children, Other Family Members Lives with:  Adult Children Do you feel safe going back to the place where you live?  Yes Need for family participation in patient care:  Yes (Comment)  Care giving concerns:  Patient lives with daughter in [place]   Social Worker assessment / plan:  CSW consulted for facility placement. CSW attempted to meet with patient but she is very confused and unable to complete assessment. CSW spoke with daughter Chelsea Horne [phone]. Daughter states that patient lives with her and has confusion at baseline. CSW explained role and PT recommendation as well as MD recommendation for IV antibiotics. Daughter is in agreement with plan to discharge to SNF. Daughter asked appropriate questions about skilled nursing and insurance. CSW explained Medicare guidelines and what is required for Medicare coverage. Daughter in agreement with bed search. CSW will initiate bed search and give daughter bed offers once received.   Employment status:  Retired Forensic scientist:  Medicare PT Recommendations:  [place] / Referral to community resources:  [place]  Patient/Family's Response to care:  Patient is confused and unable to complete assessment   Patient/Family's Understanding of and Emotional Response to Diagnosis, Current Treatment, and Prognosis:  Patient's daughter is very involved in care and thanked CSW for assistance.   Emotional Assessment Appearance:  Appears stated age Attitude/Demeanor/Rapport:  Lethargic Affect (typically observed):  Unable to Assess Orientation:  Oriented to Self Alcohol / Substance use:  Not Applicable Psych involvement (Current and /or in the community):  No (Comment)  Discharge Needs  Concerns to be addressed:  Discharge Planning Concerns Readmission within the last 30 days:  No Current discharge risk:  Cognitively Impaired, Dependent with Mobility Barriers to Discharge:  Continued Medical Work up   [employer], [place] 04/15/2018, 4:24 PM

## 2018-04-15 NOTE — Progress Notes (Signed)
Contacted IV team to establish IV site. Following IV team consult. IV team techinician communicated she had mistakenly complete the pts. PICC line order instead of peripheral IV start order. IV team technician requested I obtain new order for PICC line. Contacted on-call MD whom stated she would input order for PICC line insertion tomorrow.

## 2018-04-15 NOTE — Progress Notes (Signed)
She did not get PICC line as ordered and states she got IV access.  So the nurse asked me to put in doing order for PICC line for tomorrow.  Will order for PICC line insertion tomorrow.

## 2018-04-15 NOTE — NC FL2 (Signed)
[place] LEVEL OF CARE SCREENING TOOL     IDENTIFICATION  Patient Name: Chelsea Horne Birthdate: 01-31-1927 Sex: female Admission Date (Current Location): 04/12/2018  [place] and [place] Number:  Engineering geologist and Address:  [facility], [address] [place],  [address]      Provider Number: [id number]  Attending Physician Name and Address:  Fritzi Mandes, MD  Relative Name and Phone Number:       Current Level of Care: Hospital Recommended Level of Care: [place] Prior Approval Number:    Date Approved/Denied:   PASRR Number: [id number]  Discharge Plan: SNF    Current Diagnoses: Patient Active Problem List   Diagnosis Date Noted  . Sepsis ([place]) 04/12/2018  . Complete tear of right rotator cuff 05/03/2016  . Rotator cuff arthropathy, right 05/03/2016  . Myelodysplastic syndrome ([place]) 03/13/2015    Orientation RESPIRATION BLADDER Height & Weight     Self  Normal Incontinent Weight: 129 lb 14.4 oz (58.9 kg) Height:  5\' 8"  (172.7 cm)  BEHAVIORAL SYMPTOMS/MOOD NEUROLOGICAL BOWEL NUTRITION STATUS  (none) (none) Incontinent Diet(Heart Healthy)  AMBULATORY STATUS COMMUNICATION OF NEEDS Skin   Extensive Assist Verbally Normal                       Personal Care Assistance Level of Assistance  Bathing, Feeding, Dressing Bathing Assistance: Limited assistance Feeding assistance: Independent Dressing Assistance: Limited assistance     Functional Limitations Info  Sight, Hearing, Speech Sight Info: Adequate Hearing Info: Adequate Speech Info: Adequate    SPECIAL CARE FACTORS FREQUENCY  PT (By licensed PT), OT (By licensed OT)     PT Frequency: 5x/week OT Frequency: 5x/week            Contractures Contractures Info: Not present    Additional Factors Info  Code Status, Allergies Code Status Info: DNR Allergies Info: Nitrofurantoin, Penicillin, Sulfa Antibiotics            Current Medications (04/15/2018):  This is the current hospital active medication list Current Facility-Administered Medications  Medication Dose Route Frequency Provider Last Rate Last Dose  . 0.9 %  sodium chloride infusion   Intravenous Once Fritzi Mandes, MD      . acetaminophen (TYLENOL) tablet 650 mg  650 mg Oral Q6H PRN Amelia Jo, MD   650 mg at 04/14/18 0612   Or  . acetaminophen (TYLENOL) suppository 650 mg  650 mg Rectal Q6H PRN Amelia Jo, MD   650 mg at 04/12/18 2313  . aspirin EC tablet 81 mg  81 mg Oral Daily Fritzi Mandes, MD   81 mg at 04/15/18 1219  . bisacodyl (DULCOLAX) EC tablet 5 mg  5 mg Oral Daily PRN Amelia Jo, MD      . calcium-vitamin D (OSCAL WITH D) 500-200 MG-UNIT per tablet 1 tablet  1 tablet Oral BID Amelia Jo, MD   1 tablet at 04/15/18 1219  . ceFAZolin (ANCEF) IVPB 2g/100 mL premix  2 g Intravenous Q8H Fritzi Mandes, MD   Stopped at 04/15/18 0253  . deferoxamine (DESFERAL) 1,000 mg in dextrose 5 % 250 mL infusion  1,000 mg Intravenous Once Fritzi Mandes, MD      . docusate sodium (COLACE) capsule 100 mg  100 mg Oral BID Amelia Jo, MD   100 mg at 04/15/18 1219  . gabapentin (NEURONTIN) capsule 100 mg  100 mg Oral QHS Amelia Jo, MD   100 mg at 04/14/18  2055  . heparin injection 5,000 Units  5,000 Units Subcutaneous Q8H Amelia Jo, MD   5,000 Units at 04/15/18 0606  . HYDROcodone-acetaminophen (NORCO/VICODIN) 5-325 MG per tablet 1-2 tablet  1-2 tablet Oral Q4H PRN Amelia Jo, MD   1 tablet at 04/15/18 [phone]  . meclizine (ANTIVERT) tablet 25 mg  25 mg Oral TID PRN Amelia Jo, MD   25 mg at 04/15/18 1219  . megestrol (MEGACE) tablet 20 mg  20 mg Oral Daily Fritzi Mandes, MD   20 mg at 04/15/18 1220  . metoprolol tartrate (LOPRESSOR) tablet 25 mg  25 mg Oral BID Fritzi Mandes, MD   25 mg at 04/15/18 1218  . multivitamin with minerals tablet 1 tablet  1 tablet Oral Daily Amelia Jo, MD   1 tablet at 04/15/18 1219  . ondansetron (ZOFRAN) tablet 4  mg  4 mg Oral Q6H PRN Amelia Jo, MD       Or  . ondansetron [facility]) injection 4 mg  4 mg Intravenous Q6H PRN Amelia Jo, MD      . pneumococcal 23 valent vaccine (PNU-IMMUNE) injection 0.5 mL  0.5 mL Intramuscular Tomorrow-1000 Amelia Jo, MD      . polycarbophil (FIBERCON) tablet 625 mg  625 mg Oral Daily Fritzi Mandes, MD   625 mg at 04/15/18 1220  . sodium chloride (OCEAN) 0.65 % nasal spray 1 spray  1 spray Each Nare PRN Amelia Jo, MD      . traZODone (DESYREL) tablet 25 mg  25 mg Oral QHS PRN Amelia Jo, MD      . vitamin C (ASCORBIC ACID) tablet 1,000 mg  1,000 mg Oral Daily Amelia Jo, MD   1,000 mg at 04/15/18 1220   Facility-Administered Medications Ordered in Other Encounters  Medication Dose Route Frequency Provider Last Rate Last Dose  . sodium chloride flush (NS) 0.9 % injection 10 mL  10 mL Intravenous PRN [name], MD   10 mL at 01/29/18 0839  . sodium chloride flush (NS) 0.9 % injection 10 mL  10 mL Intravenous PRN [name], MD   10 mL at 03/30/18 0844     Discharge Medications: Please see discharge summary for a list of discharge medications.  Relevant Imaging Results:  Relevant Lab Results:   Additional Information Needs IV Antibiotics for 4 weeks via PICC line   SSN [id number]  Annamaria Boots, LCSWA

## 2018-04-15 NOTE — Progress Notes (Signed)
[place] at [place] NAME: Chelsea Horne    MR#:  [id number]  DATE OF BIRTH:  27-Jan-1927  SUBJECTIVE:   Patient poor historian due to dementia. She is awake alert how were disoriented. Spoke with daughter in the room. Sleeping this am. No fever  REVIEW OF SYSTEMS:   Review of Systems  Unable to perform ROS: Dementia   Tolerating Diet:yes Tolerating PT: pending  DRUG ALLERGIES:   Allergies  Allergen Reactions  . Nitrofurantoin Other (See Comments)  . Penicillins Itching    Has patient had a PCN reaction causing immediate rash, facial/tongue/throat swelling, SOB or lightheadedness with hypotension: Yes Has patient had a PCN reaction causing severe rash involving mucus membranes or skin necrosis: Unknown Has patient had a PCN reaction that required hospitalization: Unknown Has patient had a PCN reaction occurring within the last 10 years: Yes If all of the above answers are "NO", then may proceed with Cephalosporin use.  . Sulfa Antibiotics Itching    VITALS:  Blood pressure 123/62, pulse (!) 136, temperature 98.2 F (36.8 C), temperature source Axillary, resp. rate 18, height 5\' 8"  (1.727 m), weight 58.9 kg (129 lb 14.4 oz), SpO2 98 %.  PHYSICAL EXAMINATION:   Physical Exam  GENERAL:  84 y.o.-year-old patient lying in the bed with no acute distress.  EYES: Pupils equal, round, reactive to light and accommodation. No scleral icterus. Extraocular muscles intact.  HEENT: Head atraumatic, normocephalic. Oropharynx and nasopharynx clear.  NECK:  Supple, no jugular venous distention. No thyroid enlargement, no tenderness.  LUNGS: Normal breath sounds bilaterally, no wheezing, rales, rhonchi. No use of accessory muscles of respiration. Right upper chest port++ CARDIOVASCULAR: S1, S2 normal. No murmurs, rubs, or gallops.  ABDOMEN: Soft, nontender, nondistended. Bowel sounds present. No organomegaly or mass.  EXTREMITIES: No  cyanosis, clubbing or edema b/l.    NEUROLOGIC:  limited due to dementia. All extremities well. PSYCHIATRIC:  patient is alert but has dementia SKIN: No obvious rash, lesion, or ulcer.   LABORATORY PANEL:  CBC Recent Labs  Lab 04/13/18 0217 04/15/18 0523  WBC 8.1  --   HGB 8.1* 7.7*  HCT 23.8*  --   PLT 73*  --     Chemistries  Recent Labs  Lab 04/12/18 2053  04/15/18 0531  NA 131*   < > 134*  K 4.4   < > 3.7  CL 104   < > 114*  CO2 18*   < > 15*  GLUCOSE 162*   < > 107*  BUN 44*   < > 38*  CREATININE 1.28*   < > 0.88  CALCIUM 9.7   < > 7.9*  MG  --   --  1.7  AST 37  --   --   ALT 20  --   --   ALKPHOS 39  --   --   BILITOT 1.6*  --   --    < > = values in this interval not displayed.   Cardiac Enzymes Recent Labs  Lab 04/15/18 0531  TROPONINI 0.47*   RADIOLOGY:  [place] Ekg Site Rite  Result Date: 04/15/2018 If Site Rite image not attached, placement could not be confirmed due to current cardiac rhythm.  [place] Ekg Site Rite  Result Date: 04/15/2018 If Site Rite image not attached, placement could not be confirmed due to current cardiac rhythm.  ASSESSMENT AND PLAN:  Chelsea Horne  is a 84 y.o. female with  a known history of COPD, hypertension, recurrent urinary tract infections and breast cancer status post remote mastectomy.Patient was brought to emergency room for acute mental status change with increased lethargy and generalized weakness.  She was also noticed to be febrile at home.   1.MSSA sepsis, likely secondary to port infection.   -four out of four bottles positive for MSSA bacteria. -TTE showed no vegetation. No need for TEE per ID. Will need PICC line for IV abxs for 4 weeks -appreciate Dr. Holland Falling surgery s/p removal of port on 04/13/2018 -spoke with daughter in the room at length updated. -repeat BC from 04/14/2018 negative -will get PICC line tomorrow  2.  Acute UTI -currently on cefazolin that should cover. Follow-up urine culture shows  GNR  3.  Acute encephalopathy, multifactorial.  Patient has baseline dementia, but she is lethargic and more confused today, per family.  This is likely metabolic, secondary to acute infection.   4. Acute renal failure, likely prerenal, secondary to poor p.o. intake and fever.   -Creatinine is slightly elevated at 1.28.--1.0  -cont IV fluids and continue to monitor kidney function closely.   -Avoid nephrotoxic medications.  5. Elevated troponin in the setting of sepsis    -First troponin level is elevated at 0.37. - this is likely secondary to demand ischemia from sepsis.   - patient on aspirin.  6. History of dementia. - Patient has caregivers round-the-clock along with daughter to help her out at home.  7. History of MDS -patient follows with Dr. Grayland Ormond. Spoke with Dr. Grayland Ormond regarding patient support removal. She gets IV blood transfusions every now and then due to her MDS. -To be on appropriate shots as well. -Transfuse if needed. -Hemoglobin 9.9--- 8.1--7.7--1 unit BT today--hgb in am  8. H/o tachyarrhythmia/ atrial fibrillation -start low dose BB -Echo EF 55%  PT to see pt CSW for d/c planning  Case discussed with Care Management/Social Worker. Management plans discussed with the patient, family and they are in agreement.  CODE STATUS: DNR  DVT Prophylaxis: Lovenox  TOTAL TIME TAKING CARE OF THIS PATIENT: 35 minutes.  >50% time spent on counselling and coordination of care  POSSIBLE D/C IN few DAYS, DEPENDING ON CLINICAL CONDITION.  Note: This dictation was prepared with Dragon dictation along with smaller phrase technology. Any transcriptional errors that result from this process are unintentional.  Fritzi Mandes M.D on 04/15/2018 at 2:41 PM  Between 7am to 6pm - Pager - [phone]  After 6pm go to [email or web address] - password [email or web address] [facility]  Office  [phone]  CC: Primary care physician; Cletis Athens, MDPatient ID: Chelsea Horne, female   DOB: [birth year], 84 y.o.   MRN: [id number]

## 2018-04-15 NOTE — Progress Notes (Signed)
PHARMACY CONSULT NOTE FOR:  OUTPATIENT  PARENTERAL ANTIBIOTIC THERAPY (OPAT)  Indication: MSSA Bacteremia Regimen: Cefazolin 2 gm IV Q8H End date: 05/12/18  IV antibiotic discharge orders are pended. To discharging provider:  please sign these orders via discharge navigator,  Select New Orders & click on the button choice - Manage This Unsigned Work.     Thank you for allowing pharmacy to be a part of this patient's care.  Laural Benes, Pharm.D., BCPS Clinical Pharmacist 04/15/2018, 9:32 AM

## 2018-04-15 NOTE — Progress Notes (Signed)
Dr. Jannifer Franklin paged due to elevated BP, pt was moaning due to pain when touched. New med given, pts VS are stable, BP decreased, resting, easy to arouse but drowsy. Will continue to monitor.  Kyla Balzarine, LPN

## 2018-04-15 NOTE — Progress Notes (Signed)
Pt had 9 beats of Vtach, MD paged, Labs ordered. Pt VS stable Kyla Balzarine, LPN

## 2018-04-16 ENCOUNTER — Inpatient Hospital Stay: Payer: Medicare Other

## 2018-04-16 LAB — BASIC METABOLIC PANEL
ANION GAP: 10 (ref 5–15)
BUN: 36 mg/dL — AB (ref 6–20)
CALCIUM: 8.1 mg/dL — AB (ref 8.9–10.3)
CO2: 12 mmol/L — ABNORMAL LOW (ref 22–32)
Chloride: 113 mmol/L — ABNORMAL HIGH (ref 101–111)
Creatinine, Ser: 0.89 mg/dL (ref 0.44–1.00)
GFR calc Af Amer: >60 mL/min (ref >60)
GFR, EST NON AFRICAN AMERICAN: 56 mL/min — AB (ref >60)
GLUCOSE: 109 mg/dL — AB (ref 65–99)
Potassium: 4.4 mmol/L (ref 3.5–5.1)
SODIUM: 135 mmol/L (ref 135–145)

## 2018-04-16 LAB — PREPARE RBC (CROSSMATCH)

## 2018-04-16 LAB — HEMOGLOBIN: HEMOGLOBIN: 7.4 g/dL — AB (ref 12.0–16.0)

## 2018-04-16 LAB — GLUCOSE, CAPILLARY: GLUCOSE-CAPILLARY: 97 mg/dL (ref 65–99)

## 2018-04-16 MED ORDER — MORPHINE SULFATE (PF) 2 MG/ML IV SOLN
1.0000 mg | INTRAVENOUS | Status: DC | PRN
  Administered 2018-04-17 – 2018-04-20 (×6): 1 mg via INTRAVENOUS
  Filled 2018-04-16 (×8): qty 1

## 2018-04-16 MED ORDER — DEXTROSE 5 % IV SOLN
1000.0000 mg | Freq: Once | INTRAVENOUS | Status: DC
  Filled 2018-04-16: qty 1000

## 2018-04-16 MED ORDER — MAGNESIUM SULFATE IN D5W 1-5 GM/100ML-% IV SOLN
1.0000 g | Freq: Once | INTRAVENOUS | Status: AC
  Administered 2018-04-16: 1 g via INTRAVENOUS
  Filled 2018-04-16: qty 100

## 2018-04-16 MED ORDER — SODIUM CHLORIDE 0.9% FLUSH: 10.0000 mL | INTRAVENOUS | Status: DC | PRN

## 2018-04-16 MED ORDER — MORPHINE SULFATE (PF) 2 MG/ML IV SOLN
1.0000 mg | Freq: Once | INTRAVENOUS | Status: AC
  Administered 2018-04-16: 1 mg via INTRAVENOUS
  Filled 2018-04-16: qty 1

## 2018-04-16 MED ORDER — MORPHINE SULFATE (PF) 2 MG/ML IV SOLN
2.0000 mg | INTRAVENOUS | Status: DC | PRN
  Administered 2018-04-16 – 2018-04-17 (×2): 2 mg via INTRAVENOUS
  Filled 2018-04-16: qty 1

## 2018-04-16 MED ORDER — SODIUM CHLORIDE 0.9 % IV SOLN
INTRAVENOUS | Status: DC
  Administered 2018-04-16 – 2018-04-17 (×2): via INTRAVENOUS

## 2018-04-16 MED ORDER — MORPHINE SULFATE (PF) 2 MG/ML IV SOLN
1.0000 mg | Freq: Once | INTRAVENOUS | Status: AC
Start: 2018-04-16 — End: 2018-04-16
  Administered 2018-04-16: 14:00:00 1 mg via INTRAVENOUS
  Filled 2018-04-16: qty 1

## 2018-04-16 MED ORDER — SODIUM CHLORIDE 0.9 % IV BOLUS
1000.0000 mL | Freq: Once | INTRAVENOUS | Status: AC
  Administered 2018-04-16: 12:00:00 1000 mL via INTRAVENOUS

## 2018-04-16 NOTE — Progress Notes (Signed)
[place] Horne at [place] NAME: Chelsea Horne    MR#:  [id number]  DATE OF BIRTH:  1927-04-25  SUBJECTIVE:  Moaning  In pain,patent appears to be in distress unable to tell me where  she is hurting.  Daughter is at bedside. Did not get PICC line as ordered yesterday. REVIEW OF SYSTEMS:   Review of Systems  Unable to perform ROS: Dementia   Tolerating Diet poor p.o. intake Tolerating PT: pending  DRUG ALLERGIES:   Allergies  Allergen Reactions  . Nitrofurantoin Other (See Comments)  . Penicillins Itching    Has patient had a PCN reaction causing immediate rash, facial/tongue/throat swelling, SOB or lightheadedness with hypotension: Yes Has patient had a PCN reaction causing severe rash involving mucus membranes or skin necrosis: Unknown Has patient had a PCN reaction that required hospitalization: Unknown Has patient had a PCN reaction occurring within the last 10 years: Yes If all of the above answers are "NO", then may proceed with Cephalosporin use.  . Sulfa Antibiotics Itching    VITALS:  Blood pressure (!) 142/57, pulse 95, temperature 97.7 F (36.5 C), temperature source Oral, resp. rate 20, height 5\' 8"  (1.727 m), weight 58.9 kg (129 lb 14.4 oz), SpO2 98 %.  PHYSICAL EXAMINATION:   Physical Exam  GENERAL:  84 y.o.-year-old patient lying in the bed , clinically dehydrated,  eYES: Pupils equal, round, reactive to light and accommodation. No scleral icterus.  HEENT: Head atraumatic, normocephalic. Oropharynx and nasopharynx clear.  NECK:  Supple, no jugular venous distention. No thyroid enlargement, no tenderness.  LUNGS: Normal breath sounds bilaterally, no wheezing, rales, rhonchi. No use of accessory muscles of respiration. Right upper chest port++ CARDIOVASCULAR: S1, S2 normal. No murmurs, rubs, or gallops.  ABDOMEN: Soft, nontender, nondistended. Bowel sounds present. No organomegaly or mass.  EXTREMITIES: No  cyanosis, clubbing or edema b/l.    NEUROLOGIC:  limited due to dementia. All extremities well. PSYCHIATRIC:  patient is alert but has dementia SKIN: No obvious rash, lesion, or ulcer.   LABORATORY PANEL:  CBC Recent Labs  Lab 04/13/18 0217  04/16/18 0439  WBC 8.1  --   --   HGB 8.1*   < > 7.4*  HCT 23.8*  --   --   PLT 73*  --   --    < > = values in this interval not displayed.    Chemistries  Recent Labs  Lab 04/12/18 2053  04/15/18 0531  NA 131*   < > 134*  K 4.4   < > 3.7  CL 104   < > 114*  CO2 18*   < > 15*  GLUCOSE 162*   < > 107*  BUN 44*   < > 38*  CREATININE 1.28*   < > 0.88  CALCIUM 9.7   < > 7.9*  MG  --   --  1.7  AST 37  --   --   ALT 20  --   --   ALKPHOS 39  --   --   BILITOT 1.6*  --   --    < > = values in this interval not displayed.   Cardiac Enzymes Recent Labs  Lab 04/15/18 2233  TROPONINI 0.37*   RADIOLOGY:  [place] Ekg Site Rite  Result Date: 04/15/2018 If Site Rite image not attached, placement could not be confirmed due to current cardiac rhythm.  [place] Ekg Site Rite  Result Date: 04/15/2018 If Site  Rite image not attached, placement could not be confirmed due to current cardiac rhythm.  [place] Ekg Site Rite  Result Date: 04/15/2018 If Site Rite image not attached, placement could not be confirmed due to current cardiac rhythm.  ASSESSMENT AND PLAN:  Chelsea Horne  is a 84 y.o. female with a known history of COPD, hypertension, recurrent urinary tract infections and breast cancer status post remote mastectomy.Patient was brought to emergency room for acute mental status change with increased lethargy and generalized weakness.  She was also noticed to be febrile at home.   1.MSSA sepsis, likely secondary to port infection.   -four out of four bottles positive for MSSA bacteria. -TTE showed no vegetation. No need for TEE per ID. Will need PICC line for IV abxs for 4 weeks -appreciate Dr. Holland Falling surgery s/p removal of port on  04/13/2018 -spoke with daughter in the room at length updated. -repeat BC from 04/14/2018 negative PICC line was not placed as ordered yesterday, PICC line consult today, continue 28-day course of IV Ancef.  2.  Acute UTI -currently on cefazolin that should cover. Follow-up urine culture shows GNR  3.  Acute encephalopathy, multifactorial.  Patient has baseline dementia, patient moaning in pain, dehydrated, start gentle hydration, and bicarb with IV fluids because bicarb is low. Metabolic acidosis, started on bicarb drip, patient has generalized body pains, will give morphine 1 mg every 6 hours as needed. 4. Acute renal failure, likely prerenal, secondary to poor p.o. intake and fever.   -Creatinine is slightly elevated at 1.28.--1.0  -cont IV fluids and continue to monitor kidney function closely.   -Avoid nephrotoxic medications.  5. Elevated troponin in the setting of sepsis    -First troponin level is elevated at 0.37. - this is likely secondary to demand ischemia from sepsis.   - patient on aspirin.  6. History of dementia. - Patient has caregivers round-the-clock along with daughter to help her out at home.  7. History of MDS -patient follows with Dr. Grayland Ormond.  As per DR Grayland Ormond She gets IV blood transfusions every now and then due to her MDS.,  Transfuse 1 unit of packed RBC, patient is waiting for PICC line placement.   -8. H/o tachyarrhythmia/ atrial fibrillation -start low dose BB -Echo EF 55%  PT to see pt CSW for d/c planning  Case discussed with Care Management/Social Worker. Management plans discussed with the patient, family and they are in agreement.  CODE STATUS: DNR  DVT Prophylaxis: Lovenox  TOTAL TIME TAKING CARE OF THIS PATIENT: 35 minutes.  >50% time spent on counselling and coordination of care  POSSIBLE D/C IN few DAYS, DEPENDING ON CLINICAL CONDITION.  Note: This dictation was prepared with Dragon dictation along with smaller phrase technology.  Any transcriptional errors that result from this process are unintentional.  [name] M.D on 04/16/2018 at 12:32 PM  Between 7am to 6pm - Pager - [phone]  After 6pm go to [email or web address] - password [email or web address] [facility]  Office  [phone]  CC: Primary care physician; Cletis Athens, MDPatient ID: Chelsea Horne, female   DOB: 11-Mar-1927, 84 y.o.   MRN: [id number]

## 2018-04-16 NOTE — Progress Notes (Signed)
Writer witnessed conversation between Naval architect and patients primary nurse, Regino Schultze. Patient was on the schedule to obtain PICC placement by IR. Specials nurse notified that slot had been taken and if patient needed PICC placement by IR that it would not be until tomorrow. Therefore, the IV team would need to place. AC is aware and has notified need for placement. Patient has limited IV access and needs blood infused. Madlyn Frankel, RN

## 2018-04-16 NOTE — Plan of Care (Signed)
Pt exhibits symptoms consistent of pain based on painad scale. Pt. Exhibits repeated moaning, is grasping her hands tightly.

## 2018-04-16 NOTE — Progress Notes (Signed)
MD paged due to Blood being "ready" but the consent isn't signed. POA is not present and Hgb is 7.7 as of 0523 04/15/2018, without an active bleed. MD stated the blood admin can be administered in the morning.  Kyla Balzarine, LPN

## 2018-04-16 NOTE — Progress Notes (Addendum)
Pt foley holding urine in rubber tubing which is not draining past cath port. Pubic area above bladder. Firm and painful with palpation. Foley irrigated with 10 ml flush. Result did not produce movement of urine down cath. Tubing.  NT perform bladder scan. Scan results 0 ml. With repeated repositioning some urine entered collection bag. Pubic area over bladder less firm currently. Night shift Nurse aware and has stated she will continue to monitor

## 2018-04-16 NOTE — Progress Notes (Signed)
Physical Therapy Treatment Patient Details Name: Chelsea Horne MRN: [id number] DOB: 1927-02-21 Today's Date: 04/16/2018    History of Present Illness 84 y.o.femalewith a history of COPD, hypertension, recurrent urinary tract infections and breast cancer status post remote mastectomy. Patient was brought to emergency room for acute mental status change with increased lethargy and generalized weakness. She was also noticed to be febrile at home. At the arrival to emergency room, patient was noted to have a temperature of 103.2.Blood pressure was low at 90/53.Heart rate was elevated in 120s.BP improved with IV fluids,the patient continued to remain tachycardic and febrile. Blood test done in the emergency room reveal elevated lactic acid at 3.9.Sodium level is 131.Level is 1.28. Troponin level is 0.37. UA is positive for UTI.  Assessment includes: MSSA sepsis likely due to port infection, s/p port removal, acute UTI, acute enephalopathy, acute renal failure, elevated troponin in settng of sepsis likely demand ischemia, and a h/o MDS.    PT Comments    Pt's daughter in room and reports that pt is more mentally off today than yesterday (fatigue, c/o pain, generally more confused than even baseline).  Pt was able to participate in a limited way with light supine exercises but ultimately could not do a lot secondary to mental status and c/o pain with light exercises (L thigh worse than R knee). Pt not appropriate for mobility acts today, pt also to have transfusion this afternoon, most recent HGB of 7.4 prior to PT session.   Follow Up Recommendations  SNF;Supervision for mobility/OOB     Equipment Recommendations  None recommended by PT    Recommendations for Other Services       Precautions / Restrictions Precautions Precautions: Fall Restrictions Weight Bearing Restrictions: No    Mobility  Bed Mobility               General bed mobility comments: between  lethargy, worse mentation than yesterday and c/o pain with even gentle LE movement mobility deferred  Transfers                    Ambulation/Gait                 Stairs             Wheelchair Mobility    Modified Rankin (Stroke Patients Only)       Balance                                            Cognition Arousal/Alertness: Lethargic Behavior During Therapy: Flat affect Overall Cognitive Status: History of cognitive impairments - at baseline                                        Exercises General Exercises - Lower Extremity Ankle Circles/Pumps: PROM;10 reps;Both Quad Sets: AROM;10 reps;Both Heel Slides: AROM;10 reps;Both;AAROM Hip ABduction/ADduction: AROM;10 reps;Both;AAROM    General Comments        Pertinent Vitals/Pain Pain Assessment: Faces Faces Pain Scale: Hurts even more Pain Location: L thigh, general R LE both with movement    Home Living                      Prior Function  PT Goals (current goals can now be found in the care plan section) Progress towards PT goals: Not progressing toward goals - comment(pt lethargic and deemed inappropriate to try mobility today)    Frequency    Min 2X/week      PT Plan Current plan remains appropriate    Co-evaluation              AM-PAC PT "6 Clicks" Daily Activity  Outcome Measure  Difficulty turning over in bed (including adjusting bedclothes, sheets and blankets)?: Unable Difficulty moving from lying on back to sitting on the side of the bed? : Unable Difficulty sitting down on and standing up from a chair with arms (e.g., wheelchair, bedside commode, etc,.)?: Unable Help needed moving to and from a bed to chair (including a wheelchair)?: Total Help needed walking in hospital room?: Total Help needed climbing 3-5 steps with a railing? : Total 6 Click Score: 6    End of Session   Activity Tolerance: Patient  limited by lethargy;Patient limited by pain Patient left: in bed;with bed alarm set;with call bell/phone within reach;with family/visitor present   PT Visit Diagnosis: Muscle weakness (generalized) (M62.81);Difficulty in walking, not elsewhere classified (R26.2)     Time: 4097-3532 PT Time Calculation (min) (ACUTE ONLY): 10 min  Charges:  $Therapeutic Exercise: 8-22 mins                     Kreg Shropshire, DPT 04/16/2018, 2:26 PM

## 2018-04-16 NOTE — Progress Notes (Signed)
Chelsea Horne, [facility] aware that patient needs PICC line to receive her unit of blood and IV team has not come to floor.  Ann called back and said that IV team is eating lunch and will be up to place PICC when finished.  Clarise Cruz, RN

## 2018-04-16 NOTE — Clinical Social Work Note (Signed)
CSW met with patient's daughter [name] to discuss bed offers. Daughter chose bed at [facility]. CSW notified Seth Bake at [facility] that patient has accepted bed offer. CSW will continue to follow for discharge planning.   [place], [place]

## 2018-04-17 LAB — BASIC METABOLIC PANEL
Anion gap: 8 (ref 5–15)
BUN: 39 mg/dL — AB (ref 6–20)
CALCIUM: 7.8 mg/dL — AB (ref 8.9–10.3)
CHLORIDE: 112 mmol/L — AB (ref 101–111)
CO2: 15 mmol/L — ABNORMAL LOW (ref 22–32)
CREATININE: 1.04 mg/dL — AB (ref 0.44–1.00)
GFR calc Af Amer: 54 mL/min — ABNORMAL LOW (ref >60)
GFR, EST NON AFRICAN AMERICAN: 46 mL/min — AB (ref >60)
Glucose, Bld: 120 mg/dL — ABNORMAL HIGH (ref 65–99)
Potassium: 4.1 mmol/L (ref 3.5–5.1)
SODIUM: 135 mmol/L (ref 135–145)

## 2018-04-17 LAB — CATH TIP CULTURE: Culture: NO GROWTH

## 2018-04-17 LAB — CBC
HCT: 24.6 % — ABNORMAL LOW (ref 35.0–47.0)
Hemoglobin: 8.5 g/dL — ABNORMAL LOW (ref 12.0–16.0)
MCH: 31.9 pg (ref 26.0–34.0)
MCHC: 34.4 g/dL (ref 32.0–36.0)
MCV: 92.5 fL (ref 80.0–100.0)
PLATELETS: 54 10*3/uL — AB (ref 150–440)
RBC: 2.66 MIL/uL — ABNORMAL LOW (ref 3.80–5.20)
RDW: 17.1 % — AB (ref 11.5–14.5)
WBC: 5.9 10*3/uL (ref 3.6–11.0)

## 2018-04-17 LAB — GLUCOSE, CAPILLARY: GLUCOSE-CAPILLARY: 102 mg/dL — AB (ref 65–99)

## 2018-04-17 MED ORDER — CEFAZOLIN SODIUM-DEXTROSE 1-4 GM/50ML-% IV SOLN
1.0000 g | Freq: Two times a day (BID) | INTRAVENOUS | Status: DC
  Administered 2018-04-17 – 2018-04-19 (×3): 1 g via INTRAVENOUS
  Filled 2018-04-17 (×6): qty 50

## 2018-04-17 MED ORDER — SODIUM CHLORIDE 0.45 % IV SOLN
INTRAVENOUS | Status: DC
  Administered 2018-04-17 – 2018-04-18 (×2): via INTRAVENOUS
  Filled 2018-04-17 (×4): qty 100

## 2018-04-17 NOTE — Progress Notes (Signed)
SLP Cancellation Note  Patient Details Name: Chelsea Horne MRN: [id number] DOB: 08/15/1927   Cancelled treatment:       Reason Eval/Treat Not Completed: Patient's level of consciousness;Fatigue/lethargy limiting ability to participate. Pt moaning slightly w/ movement of R hadn/arm; Dtr stated it is swollen this morning. Dtr stated pt was "up all night and hasn't slept". Due to pt having multiple medical issues (PICC line, catheter) and having morphine this morning, pt is too sleepy/lethargic to safely participate in BSE.  Discussed this w/ her Dtr at length as well as the need to modify the oral diet from Regular to a Dysphagia 1(puree) diet w/ Nectar liquids w/ strict aspiration precautions and giving po's only when fully alert/awake. ST will f/u w/ BSE this PM or tomorrow. Dtr agreed. NSG updated.     Orinda Kenner, [place], CCC-SLP Guy Toney 04/17/2018, 11:24 AM

## 2018-04-17 NOTE — Progress Notes (Signed)
Patient foley catheter repositioned and resecured. Patient discomfort appears to be alleviated with these measure. Urine output improved.

## 2018-04-17 NOTE — Progress Notes (Signed)
[place] [place] NAME: Chelsea Horne    MR#:  [id number]  DATE OF BIRTH:  05/28/1927  SUBJECTIVE:  Had a busy night yesterday with PICC line, patient malfunctioning Foley catheter which has to be repositioned.  Received morphine last night but none this morning.  Appears to be little more comfortable than yesterday.  Started on IV fluids. REVIEW OF SYSTEMS:   Review of Systems  Unable to perform ROS: Acuity of condition  Appears to be lethargic.   Tolerating Diet poor p.o. intake Tolerating PT: pending  DRUG ALLERGIES:   Allergies  Allergen Reactions  . Nitrofurantoin Other (See Comments)  . Penicillins Itching    Has patient had a PCN reaction causing immediate rash, facial/tongue/throat swelling, SOB or lightheadedness with hypotension: Yes Has patient had a PCN reaction causing severe rash involving mucus membranes or skin necrosis: Unknown Has patient had a PCN reaction that required hospitalization: Unknown Has patient had a PCN reaction occurring within the last 10 years: Yes If all of the above answers are "NO", then may proceed with Cephalosporin use.  . Sulfa Antibiotics Itching    VITALS:  Blood pressure 104/73, pulse 88, temperature 97.6 F (36.4 C), temperature source Axillary, resp. rate 16, height 5\' 8"  (1.727 m), weight 60 kg (132 lb 4.8 oz), SpO2 98 %.  PHYSICAL EXAMINATION:   Physical Exam  GENERAL:  84 y.o.-year-old patient lying in the bed , clinically dehydrated,, appears pale. eYES: Pupils equal, round, reactive to light and accommodation. No scleral icterus.  HEENT: Head atraumatic, normocephalic. Oropharynx and nasopharynx clear.  NECK:  Supple, no jugular venous distention. No thyroid enlargement, no tenderness.  LUNGS: Normal breath sounds bilaterally, no wheezing, rales, rhonchi. No use of accessory muscles of respiration. Right upper chest port++ CARDIOVASCULAR: S1, S2 normal. No murmurs,  rubs, or gallops.  ABDOMEN: Soft, nontender, nondistended. Bowel sounds present. No organomegaly or mass.  EXTREMITIES: No cyanosis, clubbing or edema b/l.    NEUROLOGIC:  limited due to dementia. All extremities well. PSYCHIATRIC:  patient is alert but has dementia SKIN: No obvious rash, lesion, or ulcer.   LABORATORY PANEL:  CBC Recent Labs  Lab 04/17/18 0539  WBC 5.9  HGB 8.5*  HCT 24.6*  PLT 54*    Chemistries  Recent Labs  Lab 04/12/18 2053  04/15/18 0531  04/17/18 0539  NA 131*   < > 134*   < > 135  K 4.4   < > 3.7   < > 4.1  CL 104   < > 114*   < > 112*  CO2 18*   < > 15*   < > 15*  GLUCOSE 162*   < > 107*   < > 120*  BUN 44*   < > 38*   < > 39*  CREATININE 1.28*   < > 0.88   < > 1.04*  CALCIUM 9.7   < > 7.9*   < > 7.8*  MG  --   --  1.7  --   --   AST 37  --   --   --   --   ALT 20  --   --   --   --   ALKPHOS 39  --   --   --   --   BILITOT 1.6*  --   --   --   --    < > = values in this interval not displayed.  Cardiac Enzymes Recent Labs  Lab 04/15/18 2233  TROPONINI 0.37*   RADIOLOGY:  Dg Chest 1 View  Result Date: 04/16/2018 CLINICAL DATA:  Sepsis. EXAM: CHEST  1 VIEW COMPARISON:  Radiograph of Apr 12, 2018. FINDINGS: Stable cardiomegaly is noted. Atherosclerosis of thoracic aorta is noted. No pneumothorax is noted. Mild bibasilar edema or atelectasis is noted with mild pleural effusions. Bony thorax is unremarkable. IMPRESSION: Mild bibasilar edema or atelectasis is noted with mild pleural effusions. Aortic Atherosclerosis (ICD10-I70.0). Electronically Signed   By: Marijo Conception, M.D.   On: 04/16/2018 13:48   Dg Chest Port 1 View  Result Date: 04/16/2018 CLINICAL DATA:  PICC line placement. EXAM: PORTABLE CHEST 1 VIEW COMPARISON:  04/16/2018 FINDINGS: RIGHT-sided PICC line tip overlies the level of superior vena cava. There is no pneumothorax. Heart size is enlarged and stable. Opacity at the LEFT lung base obscures the hemidiaphragm, stable.  Bilateral pleural effusions are stable. Bilateral apical pleural thickening is stable. There is dense atherosclerosis of the thoracic aorta. IMPRESSION: 1. Interval placement of RIGHT-sided PICC line, tip overlying superior vena cava. 2. Stable appearance of cardiomegaly, LEFT LOWER lobe opacity, and bilateral pleural effusions. Electronically Signed   By: Nolon Nations M.D.   On: 04/16/2018 16:19   [place] Ekg Site Rite  Result Date: 04/15/2018 If Site Rite image not attached, placement could not be confirmed due to current cardiac rhythm.  ASSESSMENT AND PLAN:  Chelsea Horne  is a 84 y.o. female with a known history of COPD, hypertension, recurrent urinary tract infections and breast cancer status post remote mastectomy.Patient was brought to emergency room for acute mental status change with increased lethargy and generalized weakness.  She was also noticed to be febrile at home.   1.MSSA sepsis, likely secondary to port infection.   -four out of four bottles positive for MSSA bacteria. -TTE showed no vegetation. No need for TEE per ID. Will need PICC line for IV abxs for 4 weeks -appreciate Dr. Holland Falling surgery s/p removal of port on 04/13/2018 -spoke with daughter in the room at length updated. -repeat BC from 04/14/2018 negative PICC line placed yesterday, continue IV Ancef.  2.  Acute UTI -currently on cefazolin that should cover. Follow-up urine culture shows GNR Urine retention, Foley catheter placed yesterday.  According to nurse patient has history of urine retention at home also does   In  out cath.  Continue Foley catheter  3.  Acute encephalopathy, multifactorial.  Patient has baseline dementia, patient moaning in pain, dehydrated, start gentle hydration, and bicarb with IV fluids because bicarb is low. 4. Acute renal failure, likely prerenal, secondary to poor p.o. intake' continue IV fluids with metabolic acidosis  -Creatinine is slightly elevated at 1.28.--1.0  -cont IV  fluids and continue to monitor kidney function closely.   -Avoid nephrotoxic medications.  5. Elevated troponin in the setting of sepsis    -First troponin level is elevated at 0.37. - this is likely secondary to demand ischemia from sepsis.   - patient on aspirin.  6. History of dementia.,  Lethargic, seen by speech therapy, continue pured diet as tolerated with aspiration precautions.-on IV hydration, bicarb drip, poor p.o. intake, needs further monitoring in the hospital.  Likely discharge next week.  Discussed this with patient's daughter and also Education officer, museum.  7. 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 Grayland Ormond.  As per DR Grayland Ormond She gets IV blood transfusions every now and then due to her MDS.,  Transfused  1 unit of packed RBC, supposed to get Procrit injections, Patient hemoglobin is improved to 8.5 from 7.4, looking at her record hemoglobin is around 8.1.  . H/o tachyarrhythmia/ atrial fibrillation -start low dose BB -Echo EF 55%  PT to see pt CSW for d/c planning  Case discussed with Care Management/Social Worker. Management plans discussed with the patient, family and they are in agreement.  CODE STATUS: DNR  DVT Prophylaxis: Lovenox  TOTAL TIME TAKING CARE OF THIS PATIENT: 35 minutes.  >50% time spent on counselling and coordination of care  POSSIBLE D/C IN few DAYS, DEPENDING ON CLINICAL CONDITION.  Note: This dictation was prepared with Dragon dictation along with smaller phrase technology. Any transcriptional errors that result from this process are unintentional.  [name] M.D on 04/17/2018 at 12:04 PM  Between 7am to 6pm - Pager - [phone]  After 6pm go to [email or web address] - password [email or web address] [facility]  Office  [phone]  CC: Primary care physician; Cletis Athens, MDPatient ID: Chelsea Horne, female   DOB: Apr 04, 1927, 84 y.o.   MRN: [id number]

## 2018-04-17 NOTE — Progress Notes (Addendum)
Pharmacy Antibiotic Note  Chelsea Horne is a 84 y.o. female admitted on 04/12/2018 with MSSA bacteremia.  Pharmacy has been consulted for cefazolin dosing. Patient narrowed from cefepime and vancomycin. Patient tolerated cefepime this admission and has tolerated ceftriaxone on previous admissions.   Plan: Will start cefazolin 1g IV Q12hr (renally adjusted cefazolin 2g IV Q8hr). Will recheck serum creatinine with am labs and adjust as warranted.   Patient to have repeat blood cultures on 5/28 and will need to get TEE.   5/28 05:32 SCr has improved to 1 mg/dL, CrCl approximately 35 to 40 mL/min. Increase cefazolin to 2 gm IV Q8H. TTE shows no obvious vegetation, 4 of 4 bottles BCx with MSSA still pending susceptibilities. Pharmacy will continue to follow.   5/31 05:39 SCr increased and CrCl now approximately 30 to 35 mL/min. Decrease Ancef to 1 gm IV Q12H. Pharmacy will continue to follow and adjust for renal function.   Height: 5\' 8"  (172.7 cm) Weight: 132 lb 4.8 oz (60 kg) IBW/kg (Calculated) : 63.9  Temp (24hrs), Avg:98.1 F (36.7 C), Min:97.6 F (36.4 C), Max:98.9 F (37.2 C)  Recent Labs  Lab 04/12/18 2053 04/12/18 2355 04/13/18 0211 04/13/18 0217 04/13/18 0525 04/14/18 0532 04/15/18 0531 04/16/18 1331 04/17/18 0539  WBC 8.8  --   --  8.1  --   --   --   --  5.9  CREATININE 1.28*  --   --  1.19*  --  1.00 0.88 0.89 1.04*  LATICACIDVEN 3.9* 5.0* 3.0*  --  2.9*  --   --   --   --     Estimated Creatinine Clearance: 34.7 mL/min (A) (by C-G formula based on SCr of 1.04 mg/dL (H)).    Allergies  Allergen Reactions  . Nitrofurantoin Other (See Comments)  . Penicillins Itching    Has patient had a PCN reaction causing immediate rash, facial/tongue/throat swelling, SOB or lightheadedness with hypotension: Yes Has patient had a PCN reaction causing severe rash involving mucus membranes or skin necrosis: Unknown Has patient had a PCN reaction that required hospitalization:  Unknown Has patient had a PCN reaction occurring within the last 10 years: Yes If all of the above answers are "NO", then may proceed with Cephalosporin use.  . Sulfa Antibiotics Itching    Antimicrobials this admission: Aztreonam 5/26 x 1 Levofloxacin 5/26 x 1  Vancomycin 5/26 x 1 Cefepime 5/27 x 1 Cefazolin 5/27 >>  Dose adjustments this admission: N/A  Microbiology results: 5/26 BCx: MSSA  Thank you for allowing pharmacy to be a part of this patient's care.  Laural Benes 04/17/2018 11:06 AM

## 2018-04-17 NOTE — Progress Notes (Signed)
PT Cancellation Note  Patient Details Name: Chelsea Horne MRN: [id number] DOB: 1927-11-15   Cancelled Treatment:    Reason Eval/Treat Not Completed: Patient's level of consciousness Spoke with SLP, reports pt is very sleepy at this time (got morphine) and hardly slept last night.  Pt struggled to participate yesterday and seems to be more lethargic/limited this date.  Kreg Shropshire 04/17/2018, 11:32 AM

## 2018-04-17 NOTE — Progress Notes (Signed)
[facility] INFECTIOUS [facility] PROGRESS NOTE Date of Admission:  04/12/2018     ID: Chelsea Horne is a 84 y.o. female with MSSA bacteremia Active Problems:   Sepsis ([place])   Subjective: No fevers. PICC placed but having some R shoulder pain after. Had to have foley placed  ROS  Unable to obtain  Medications:  Antibiotics Given (last 72 hours)    Date/Time Action Medication Dose Rate   04/14/18 1856 New Bag/Given   ceFAZolin (ANCEF) IVPB 2g/100 mL premix 2 g 200 mL/hr   04/15/18 0219 New Bag/Given   ceFAZolin (ANCEF) IVPB 2g/100 mL premix 2 g 200 mL/hr   04/16/18 0228 New Bag/Given   ceFAZolin (ANCEF) IVPB 2g/100 mL premix 2 g 200 mL/hr   04/16/18 1232 New Bag/Given   ceFAZolin (ANCEF) IVPB 2g/100 mL premix 2 g 200 mL/hr   04/16/18 1718 New Bag/Given   ceFAZolin (ANCEF) IVPB 2g/100 mL premix 2 g 200 mL/hr   04/17/18 0423 New Bag/Given   ceFAZolin (ANCEF) IVPB 2g/100 mL premix 2 g 200 mL/hr     . aspirin EC  81 mg Oral Daily  . calcium-vitamin D  1 tablet Oral BID  . docusate sodium  100 mg Oral BID  . heparin  5,000 Units Subcutaneous Q8H  . megestrol  20 mg Oral Daily  . metoprolol tartrate  25 mg Oral BID  . multivitamin with minerals  1 tablet Oral Daily  . pneumococcal 23 valent vaccine  0.5 mL Intramuscular Tomorrow-1000  . polycarbophil  625 mg Oral Daily  . vitamin C  1,000 mg Oral Daily    Objective: Vital signs in last 24 hours: Temp:  [97.6 F (36.4 C)-98.9 F (37.2 C)] 97.7 F (36.5 C) (05/31 1337) Pulse Rate:  [88-97] 91 (05/31 1337) Resp:  [16-22] 19 (05/31 1337) BP: (104-128)/(36-81) 128/81 (05/31 1337) SpO2:  [85 %-99 %] 98 % (05/31 1337) Weight:  [60 kg (132 lb 4.8 oz)] 60 kg (132 lb 4.8 oz) (05/31 0458) Constitutional:  Awake, itneractive but unable to provide history. HENT: [place]/AT, PERRLA, no scleral icterus Mouth/Throat: Oropharynx is clear and moist. No oropharyngeal exudate.  Cardiovascular: Normal rate, regular rhythm and normal  heart sounds Pulmonary/Chest: Effort normal and breath sounds normal. No respiratory distress.  has no wheezes.  Neck = supple, no nuchal rigidity R chest wall site nml post removal PC  PICC RUE  Abdominal: Soft. Bowel sounds are normal.  exhibits no distension. There is no tenderness.  Lymphadenopathy: no cervical adenopathy. No axillary adenopathy Neurological: pleasantly demented Skin: Skin is warm and dry. No rash noted. No erythema.  Psychiatric: a normal mood and affect.  behavior is normal.   Lab Results Recent Labs    04/16/18 0439 04/16/18 1331 04/17/18 0539  WBC  --   --  5.9  HGB 7.4*  --  8.5*  HCT  --   --  24.6*  NA  --  135 135  K  --  4.4 4.1  CL  --  113* 112*  CO2  --  12* 15*  BUN  --  36* 39*  CREATININE  --  0.89 1.04*    Microbiology: Results for orders placed or performed during the hospital encounter of 04/12/18  Urine culture     Status: Abnormal   Collection Time: 04/12/18  8:50 PM  Result Value Ref Range Status   Specimen Description   Final    URINE, CATHETERIZED Performed at [facility], [place],  [place], [place] [address]    Special Requests   Final    NONE Performed at [facility], [address] [place], [place] [address]    Culture >=100,000 COLONIES/mL KLEBSIELLA PNEUMONIAE (A)  Final   Report Status 04/15/2018 FINAL  Final   Organism ID, Bacteria KLEBSIELLA PNEUMONIAE (A)  Final      Susceptibility   Klebsiella pneumoniae - MIC*    AMPICILLIN >=32 RESISTANT Resistant     CEFAZOLIN <=4 SENSITIVE Sensitive     CEFTRIAXONE <=1 SENSITIVE Sensitive     CIPROFLOXACIN 0.5 SENSITIVE Sensitive     GENTAMICIN <=1 SENSITIVE Sensitive     IMIPENEM <=0.25 SENSITIVE Sensitive     NITROFURANTOIN 32 SENSITIVE Sensitive     TRIMETH/SULFA <=20 SENSITIVE Sensitive     AMPICILLIN/SULBACTAM 4 SENSITIVE Sensitive     PIP/TAZO <=4 SENSITIVE Sensitive     Extended ESBL NEGATIVE Sensitive     * >=100,000 COLONIES/mL  KLEBSIELLA PNEUMONIAE  Blood Culture (routine x 2)     Status: Abnormal   Collection Time: 04/12/18  8:53 PM  Result Value Ref Range Status   Specimen Description   Final    BLOOD PORTA CATH Performed at [facility], [address], [place], [place] [address]    Special Requests   Final    BOTTLES DRAWN AEROBIC AND ANAEROBIC Blood Culture adequate volume Performed at [facility], [address] [place], [place] [address]    Culture  Setup Time   Final    GRAM POSITIVE COCCI IN CLUSTERS IN BOTH AEROBIC AND ANAEROBIC BOTTLES CRITICAL RESULT CALLED TO, READ BACK BY AND VERIFIED WITH: RODNEY GRUBB AT 0729 ON 04/13/18 BY SNJ Performed at [facility], [place] [address] [place],  [address]    Culture STAPHYLOCOCCUS AUREUS (A)  Final   Report Status 04/15/2018 FINAL  Final   Organism ID, Bacteria STAPHYLOCOCCUS AUREUS  Final      Susceptibility   Staphylococcus aureus - MIC*    CIPROFLOXACIN <=0.5 SENSITIVE Sensitive     ERYTHROMYCIN <=0.25 SENSITIVE Sensitive     GENTAMICIN <=0.5 SENSITIVE Sensitive     OXACILLIN <=0.25 SENSITIVE Sensitive     TETRACYCLINE <=1 SENSITIVE Sensitive     VANCOMYCIN 1 SENSITIVE Sensitive     TRIMETH/SULFA <=10 SENSITIVE Sensitive     CLINDAMYCIN <=0.25 SENSITIVE Sensitive     RIFAMPIN <=0.5 SENSITIVE Sensitive     Inducible Clindamycin NEGATIVE Sensitive     * STAPHYLOCOCCUS AUREUS  Blood Culture ID Panel (Reflexed)     Status: Abnormal   Collection Time: 04/12/18  8:53 PM  Result Value Ref Range Status   Enterococcus species NOT DETECTED NOT DETECTED Final   Listeria monocytogenes NOT DETECTED NOT DETECTED Final   Staphylococcus species DETECTED (A) NOT DETECTED Final    Comment: CRITICAL RESULT CALLED TO, READ BACK BY AND VERIFIED WITH: RODNEY GRUBB AT 0729 ON 04/13/18 BY SNJ    Staphylococcus aureus DETECTED (A) NOT DETECTED Final    Comment: Methicillin (oxacillin) susceptible Staphylococcus aureus (MSSA).  Preferred therapy is anti staphylococcal beta lactam antibiotic (Cefazolin or Nafcillin), unless clinically contraindicated. CRITICAL RESULT CALLED TO, READ BACK BY AND VERIFIED WITH: RODNEY GRUBB AT 0729 ON 04/13/18 BY SNJ    Methicillin resistance NOT DETECTED NOT DETECTED Final   Streptococcus species NOT DETECTED NOT DETECTED Final   Streptococcus agalactiae NOT DETECTED NOT DETECTED Final   Streptococcus pneumoniae NOT DETECTED NOT DETECTED Final   Streptococcus pyogenes NOT DETECTED NOT DETECTED  Final   Acinetobacter baumannii NOT DETECTED NOT DETECTED Final   Enterobacteriaceae species NOT DETECTED NOT DETECTED Final   Enterobacter cloacae complex NOT DETECTED NOT DETECTED Final   Escherichia coli NOT DETECTED NOT DETECTED Final   Klebsiella oxytoca NOT DETECTED NOT DETECTED Final   Klebsiella pneumoniae NOT DETECTED NOT DETECTED Final   Proteus species NOT DETECTED NOT DETECTED Final   Serratia marcescens NOT DETECTED NOT DETECTED Final   Haemophilus influenzae NOT DETECTED NOT DETECTED Final   Neisseria meningitidis NOT DETECTED NOT DETECTED Final   Pseudomonas aeruginosa NOT DETECTED NOT DETECTED Final   Candida albicans NOT DETECTED NOT DETECTED Final   Candida glabrata NOT DETECTED NOT DETECTED Final   Candida krusei NOT DETECTED NOT DETECTED Final   Candida parapsilosis NOT DETECTED NOT DETECTED Final   Candida tropicalis NOT DETECTED NOT DETECTED Final    Comment: Performed at [facility], [address] [place], [place] [address]  Blood Culture (routine x 2)     Status: Abnormal   Collection Time: 04/12/18  8:54 PM  Result Value Ref Range Status   Specimen Description   Final    BLOOD RIGHT ANTECUBITAL Performed at [facility], [address], [place], [place] [address]    Special Requests   Final    BOTTLES DRAWN AEROBIC AND ANAEROBIC Blood Culture adequate volume Performed at [facility], [address] [place], [place] [address]     Culture  Setup Time   Final    GRAM POSITIVE COCCI IN CLUSTERS IN BOTH AEROBIC AND ANAEROBIC BOTTLES CRITICAL VALUE NOTED.  VALUE IS CONSISTENT WITH PREVIOUSLY REPORTED AND CALLED VALUE. Performed at [facility], [address] [place], [place] [address]    Culture (A)  Final    STAPHYLOCOCCUS AUREUS SUSCEPTIBILITIES PERFORMED ON PREVIOUS CULTURE WITHIN THE LAST 5 DAYS. Performed at [facility], [place] [address] [place], [place] [address]    Report Status 04/15/2018 FINAL  Final  Cath Tip Culture     Status: None   Collection Time: 04/13/18  4:59 PM  Result Value Ref Range Status   Specimen Description   Final    CATH TIP Performed at [facility], [address] [place], [place] [address]    Special Requests   Final    NONE Performed at [facility], [address], [place], [place] [address]    Culture   Final    NO GROWTH 3 DAYS Performed at [facility], [place] [address], [place], [place] [address]    Report Status 04/17/2018 FINAL  Final  CULTURE, BLOOD (ROUTINE X 2) w Reflex to ID Panel     Status: None (Preliminary result)   Collection Time: 04/14/18  7:41 AM  Result Value Ref Range Status   Specimen Description BLOOD BLOOD RIGHT WRIST  Final   Special Requests   Final    BOTTLES DRAWN AEROBIC AND ANAEROBIC Blood Culture adequate volume   Culture   Final    NO GROWTH 3 DAYS Performed at [facility], [address], [place],  [address]    Report Status PENDING  Incomplete  CULTURE, BLOOD (ROUTINE X 2) w Reflex to ID Panel     Status: None (Preliminary result)   Collection Time: 04/14/18  8:00 AM  Result Value Ref Range Status   Specimen Description BLOOD BLOOD RIGHT HAND  Final   Special Requests   Final    BOTTLES DRAWN AEROBIC AND ANAEROBIC  Blood Culture adequate volume   Culture   Final    NO GROWTH 3 DAYS Performed at [facility], [address] [place], [place] [address]    Report  Status PENDING  Incomplete    Studies/Results: Dg Chest 1 View  Result Date: 04/16/2018 CLINICAL DATA:  Sepsis. EXAM: CHEST  1 VIEW COMPARISON:  Radiograph of Apr 12, 2018. FINDINGS: Stable cardiomegaly is noted. Atherosclerosis of thoracic aorta is noted. No pneumothorax is noted. Mild bibasilar edema or atelectasis is noted with mild pleural effusions. Bony thorax is unremarkable. IMPRESSION: Mild bibasilar edema or atelectasis is noted with mild pleural effusions. Aortic Atherosclerosis (ICD10-I70.0). Electronically Signed   By: Marijo Conception, M.D.   On: 04/16/2018 13:48   Dg Chest Port 1 View  Result Date: 04/16/2018 CLINICAL DATA:  PICC line placement. EXAM: PORTABLE CHEST 1 VIEW COMPARISON:  04/16/2018 FINDINGS: RIGHT-sided PICC line tip overlies the level of superior vena cava. There is no pneumothorax. Heart size is enlarged and stable. Opacity at the LEFT lung base obscures the hemidiaphragm, stable. Bilateral pleural effusions are stable. Bilateral apical pleural thickening is stable. There is dense atherosclerosis of the thoracic aorta. IMPRESSION: 1. Interval placement of RIGHT-sided PICC line, tip overlying superior vena cava. 2. Stable appearance of cardiomegaly, LEFT LOWER lobe opacity, and bilateral pleural effusions. Electronically Signed   By: Nolon Nations M.D.   On: 04/16/2018 16:19   [place] Ekg Site Rite  Result Date: 04/15/2018 If Site Rite image not attached, placement could not be confirmed due to current cardiac rhythm.   Assessment/Plan: Chelsea Horne is a 84 y.o. female with history of dementia, and myelodysplastic syndrome (portacath in place for blood products - follows with Dr Grayland Ormond)  admitted with sepsis due to MSSA bacteremia likely from skin source on LE scab. . Clinically improving, portacath removed, fu cx NGTD and TTE negative. I discussed the case with her niece and with the patient as well as Dr Posey Pronto. I would be loath to put her through a TEE . She  has improved clinically, fu bcx neg from 5/28 and cath removed.  Klebsiella in urine is S to cefazolin as well.  Recommendations Would cont a 28 day course if IV ancef- see abx order sheet. PICC  placed After 4 weeks if bcx negative in follow up could consider placing another port as she seems to be dependent on blood products for her myelodysplasia.  I have discussed with Dr Grayland Ormond. Thank you very much for the consult. Will follow with you.  Leonel Ramsay   04/17/2018, 3:22 PM

## 2018-04-17 NOTE — Progress Notes (Signed)
PHARMACY CONSULT NOTE FOR:  OUTPATIENT  PARENTERAL ANTIBIOTIC THERAPY (OPAT)  Indication: MSSA Bacteremia Regimen: Cefazolin 1 gm IV Q12H End date: 05/12/18  IV antibiotic discharge orders are pended. To discharging provider:  please sign these orders via discharge navigator,  Select New Orders & click on the button choice - Manage This Unsigned Work.     Thank you for allowing pharmacy to be a part of this patient's care.  Laural Benes, Pharm.D., BCPS Clinical Pharmacist 04/17/2018, 12:13 PM

## 2018-04-17 NOTE — Plan of Care (Signed)
  Problem: Education: Goal: Knowledge of General Education information will improve Outcome: Progressing   Problem: Pain Managment: Goal: General experience of comfort will improve Outcome: Progressing   Problem: Safety: Goal: Ability to remain free from injury will improve Outcome: Progressing   Problem: Skin Integrity: Goal: Risk for impaired skin integrity will decrease Outcome: Progressing   Problem: Fluid Volume: Goal: Hemodynamic stability will improve Outcome: Progressing   Problem: Clinical Measurements: Goal: Diagnostic test results will improve Outcome: Progressing Goal: Signs and symptoms of infection will decrease Outcome: Progressing

## 2018-04-17 NOTE — Evaluation (Signed)
Clinical/Bedside Swallow Evaluation Patient Details  Name: Chelsea Horne MRN: [id number] Date of Birth: 1927-06-23  Today's Date: 04/17/2018 Time: SLP Start Time (ACUTE ONLY): 1300 SLP Stop Time (ACUTE ONLY): 1400 SLP Time Calculation (min) (ACUTE ONLY): 60 min  Past Medical History:  Past Medical History:  Diagnosis Date  . Breast cancer ([place])    mastectomy  . COPD (chronic obstructive pulmonary disease) ([employer])   . Heart murmur   . Hiatal hernia   . Hypertension   . UTI (lower urinary tract infection)   . UTI (urinary tract infection)    frequent in past   Past Surgical History:  Past Surgical History:  Procedure Laterality Date  . ABDOMINAL HYSTERECTOMY    . BONE MARROW ASPIRATION  2018  . FOOT SURGERY     for hammertoe  . MASTECTOMY Left    modified radical in August 1996  . PORT-A-CATH REMOVAL N/A 04/13/2018   Procedure: REMOVAL PORT-A-CATH;  Surgeon: Hadley Pen, DO;  Location: [facility] ORS;  Service: General;  Laterality: N/A;  . PORTACATH PLACEMENT N/A 12/09/2017   Procedure: INSERTION PORT-A-CATH;  Surgeon: Vickie Epley, MD;  Location: [facility] ORS;  Service: Vascular;  Laterality: N/A;   HPI:  Pt is a 84 y.o. female with a known history of COPD, hypertension, recurrent urinary tract infections and breast cancer status post remote mastectomy. Patient was brought to emergency room for acute mental status change with increased lethargy and generalized weakness.  She was also noticed to be febrile at home. Elevated Lactic acid. Troponin level is 0.37.  UA is positive for UTI.  X-ray, reviewed by myself is negative for any acute cardiopulmonary disease. Pt admitted with sepsis and MSSA bacteremia. She recently had a subcutaneous medication port placed in the right chest in order to receive frequent blood transfusions. In light of current bacteremia, the infectious disease team recommends removal of the medication port. PICC line placed. Pt has continued to have confusion  and pain; receiving pain medication per NSG.    Assessment / Plan / Recommendation Clinical Impression  Pt appears to present w/ oropharyngeal phase dysphagia and appears at increased risk for aspiration secondary to Cognitive decline and drowsiness(pain medications, illness). Pt exhibited decreased awareness during bolus trials requiring verbal/tactile cues - suspect related to illness and Cognitive status. Pt required mod verbal/tactile cues for follow through w/ oral intake tasks, positioning upright in bed for po trials. Pt appeared to adequately tolerate trials of Puree and Nectar consistency liquids VIA TSP then CUP (helping to hold for drinking) w/ no immediate, overt s/s of aspiration noted; no decline in vocal quality or significant change in respiratory presentation. Pt's oral phase was c/b prolonged A-P transit and oral holding intermittently w/ min-mod. extra time required for complete bolus transfer, swallow and oral clearing. No other trial consistencies were assessed d/t this presentation and increased risk for aspiration. Recommend a dysphagia level 1 (PUREE) w/ NECTAR consistency liquids by TSP/CUP; strict aspiration precautions. Full feeding support when fully alert/awake for safe oral intake. ST services will continue to f/u w/ pt's status and trials to upgrade diet when pt's Cognitive status improves for safety w/ trials. Family given education on the above and education on supporting during feeding.   SLP Visit Diagnosis: Dysphagia, oropharyngeal phase (R13.12)    Aspiration Risk  Mild aspiration risk;Moderate aspiration risk;Risk for inadequate nutrition/hydration    Diet Recommendation  Dysphagia level 1(puree) w/ NECTAR consistency liquids via TSP/CUP; strict aspiration precautions; feeding support at all meals.  Give po's only when fully awake/alert.   Medication Administration: Crushed with puree(as able)    Other  Recommendations Recommended Consults: (Dietician f/u) Oral  Care Recommendations: Oral care BID;Staff/trained caregiver to provide oral care Other Recommendations: Order thickener from pharmacy;Prohibited food (jello, ice cream, thin soups);Remove water pitcher;Have oral suction available   Follow up Recommendations Skilled Nursing facility      Frequency and Duration min 3x week  2 weeks       Prognosis Prognosis for Safe Diet Advancement: Fair Barriers to Reach Goals: Cognitive deficits      Swallow Study   General Date of Onset: 04/12/18 HPI: Pt is a 84 y.o. female with a known history of COPD, hypertension, recurrent urinary tract infections and breast cancer status post remote mastectomy. Patient was brought to emergency room for acute mental status change with increased lethargy and generalized weakness.  She was also noticed to be febrile at home. Elevated Lactic acid. Troponin level is 0.37.  UA is positive for UTI.  X-ray, reviewed by myself is negative for any acute cardiopulmonary disease. Pt admitted with sepsis and MSSA bacteremia. She recently had a subcutaneous medication port placed in the right chest in order to receive frequent blood transfusions. In light of current bacteremia, the infectious disease team recommends removal of the medication port. PICC line placed. Pt has continued to have confusion and pain; receiving pain medication per NSG.  Type of Study: Bedside Swallow Evaluation Previous Swallow Assessment: none Diet Prior to this Study: Regular;Thin liquids Temperature Spikes Noted: No(wbc 5.9) Respiratory Status: Room air History of Recent Intubation: No Behavior/Cognition: Cooperative;Pleasant mood;Confused;Lethargic/Drowsy;Distractible;Requires cueing Oral Cavity Assessment: Dry Oral Care Completed by SLP: Recent completion by staff Oral Cavity - Dentition: Adequate natural dentition;Missing dentition(few) Vision: Functional for self-feeding(grossly) Self-Feeding Abilities: Able to feed self;Needs assist;Needs set  up;Total assist(able to hold cup) Patient Positioning: Upright in bed Baseline Vocal Quality: Low vocal intensity(few words spoken) Volitional Cough: Cognitively unable to elicit Volitional Swallow: Unable to elicit    Oral/Motor/Sensory Function Overall Oral Motor/Sensory Function: Within functional limits(and w/ bolus management)   Ice Chips Ice chips: Not tested   Thin Liquid Thin Liquid: Not tested    Nectar Thick Nectar Thick Liquid: Impaired Presentation: Cup;Self Fed;Spoon(4 trials via each) Oral Phase Impairments: Poor awareness of bolus Oral phase functional implications: Prolonged oral transit;Oral holding(intermittently) Pharyngeal Phase Impairments: (none) Other Comments: pt was able to help hold cup to drink w/ SLP   Honey Thick Honey Thick Liquid: Not tested   Puree Puree: Impaired Presentation: Spoon(fed; 7 trials) Oral Phase Impairments: Poor awareness of bolus Oral Phase Functional Implications: Prolonged oral transit;Oral holding(intermittently) Pharyngeal Phase Impairments: (none)   Solid   GO   Solid: Not tested         Orinda Kenner, MS, CCC-SLP Watson,Katherine 04/17/2018,4:51 PM

## 2018-04-17 NOTE — Care Management Important Message (Signed)
Important Message  Patient Details  Name: Chelsea Horne MRN: [id number] Date of Birth: [birth year]   Medicare Important Message Given:  Yes    [name] 04/17/2018, 11:41 AM

## 2018-04-18 ENCOUNTER — Inpatient Hospital Stay: Payer: Medicare Other

## 2018-04-18 LAB — BLOOD GAS, ARTERIAL
ACID-BASE DEFICIT: 7.3 mmol/L — AB (ref 0.0–2.0)
Bicarbonate: 14.5 mmol/L — ABNORMAL LOW (ref 20.0–28.0)
FIO2: 0.21
O2 SAT: 94.4 %
PH ART: 7.49 — AB (ref 7.350–7.450)
PO2 ART: 66 mmHg — AB (ref 83.0–108.0)
Patient temperature: 37
pCO2 arterial: 19 mmHg — CL (ref 32.0–48.0)

## 2018-04-18 LAB — BASIC METABOLIC PANEL
Anion gap: 9 (ref 5–15)
BUN: 40 mg/dL — ABNORMAL HIGH (ref 6–20)
CHLORIDE: 113 mmol/L — AB (ref 101–111)
CO2: 16 mmol/L — AB (ref 22–32)
Calcium: 7.7 mg/dL — ABNORMAL LOW (ref 8.9–10.3)
Creatinine, Ser: 1.14 mg/dL — ABNORMAL HIGH (ref 0.44–1.00)
GFR calc Af Amer: 48 mL/min — ABNORMAL LOW (ref >60)
GFR calc non Af Amer: 41 mL/min — ABNORMAL LOW (ref >60)
GLUCOSE: 142 mg/dL — AB (ref 65–99)
POTASSIUM: 4.2 mmol/L (ref 3.5–5.1)
Sodium: 138 mmol/L (ref 135–145)

## 2018-04-18 LAB — CBC
HCT: 24.5 % — ABNORMAL LOW (ref 35.0–47.0)
Hemoglobin: 8.4 g/dL — ABNORMAL LOW (ref 12.0–16.0)
MCH: 31.8 pg (ref 26.0–34.0)
MCHC: 34.2 g/dL (ref 32.0–36.0)
MCV: 92.9 fL (ref 80.0–100.0)
Platelets: 75 10*3/uL — ABNORMAL LOW (ref 150–440)
RBC: 2.63 MIL/uL — ABNORMAL LOW (ref 3.80–5.20)
RDW: 17.7 % — ABNORMAL HIGH (ref 11.5–14.5)
WBC: 8.6 10*3/uL (ref 3.6–11.0)

## 2018-04-18 LAB — GLUCOSE, CAPILLARY: Glucose-Capillary: 127 mg/dL — ABNORMAL HIGH (ref 65–99)

## 2018-04-18 MED ORDER — SODIUM BICARBONATE 8.4 % IV SOLN
50.0000 meq | Freq: Once | INTRAVENOUS | Status: AC
  Administered 2018-04-18: 50 meq via INTRAVENOUS

## 2018-04-18 MED ORDER — FUROSEMIDE 10 MG/ML IJ SOLN
20.0000 mg | Freq: Once | INTRAMUSCULAR | Status: AC
  Administered 2018-04-18: 13:00:00 20 mg via INTRAVENOUS
  Filled 2018-04-18: qty 2

## 2018-04-18 MED ORDER — LORAZEPAM 2 MG/ML IJ SOLN: 2.0000 mg | Freq: Once | INTRAMUSCULAR | Status: DC

## 2018-04-18 MED ORDER — SODIUM BICARBONATE 8.4 % IV SOLN
100.0000 meq | Freq: Once | INTRAVENOUS | Status: DC
  Filled 2018-04-18: qty 50

## 2018-04-18 MED ORDER — SODIUM CHLORIDE 0.9 % IV BOLUS
1000.0000 mL | Freq: Once | INTRAVENOUS | Status: AC
  Administered 2018-04-18: 04:00:00 1000 mL via INTRAVENOUS

## 2018-04-18 MED ORDER — DILTIAZEM HCL 25 MG/5ML IV SOLN
10.0000 mg | Freq: Once | INTRAVENOUS | Status: DC
  Filled 2018-04-18 (×2): qty 5

## 2018-04-18 NOTE — Progress Notes (Signed)
[facility] [place] NAME: Chelsea Horne    MR#:  [id number]  DATE OF BIRTH:  Oct 05, 1927  SUBJECTIVE:  Appeared to have labored breathing so we did ABG, chest x-ray ABG showed normal pH but low bicarb and hypoxia, chest x-ray showed left pleural effusion.  Spoke with daughter about them she said he had one more day but she does not want to put her through any painful procedures. REVIEW OF SYSTEMS:   Review of Systems  Unable to perform ROS: Acuity of condition  Appears to be lethargic.   Tolerating Diet poor p.o. intake Tolerating PT: Poor p.o. intake, hypotensive last night also found to have A. fib with RVR. DRUG ALLERGIES:   Allergies  Allergen Reactions  . Nitrofurantoin Other (See Comments)  . Penicillins Itching    Has patient had a PCN reaction causing immediate rash, facial/tongue/throat swelling, SOB or lightheadedness with hypotension: Yes Has patient had a PCN reaction causing severe rash involving mucus membranes or skin necrosis: Unknown Has patient had a PCN reaction that required hospitalization: Unknown Has patient had a PCN reaction occurring within the last 10 years: Yes If all of the above answers are "NO", then may proceed with Cephalosporin use.  . Sulfa Antibiotics Itching    VITALS:  Blood pressure 123/83, pulse (!) 129, temperature 97.7 F (36.5 C), temperature source Oral, resp. rate 16, height 5\' 8"  (1.727 m), weight 60 kg (132 lb 4.8 oz), SpO2 99 %.  PHYSICAL EXAMINATION:   Physical Exam  GENERAL:  84 y.o.-year-old patient lying in the bed , clinically dehydrated,, appears pale. eYES: Pupils equal, round, reactive to light and accommodation. No scleral icterus.  HEENT: Head atraumatic, normocephalic. Oropharynx and nasopharynx clear.  NECK:  Supple, no jugular venous distention. No thyroid enlargement, no tenderness.  LUNGS: Normal breath sounds bilaterally, no wheezing, rales, rhonchi. No use  of accessory muscles of respiration. Right upper chest port++ CARDIOVASCULAR: S1, S2 normal. No murmurs, rubs, or gallops.  ABDOMEN: Soft, nontender, nondistended. Bowel sounds present. No organomegaly or mass.  EXTREMITIES: No cyanosis, clubbing or edema b/l.    NEUROLOGIC:  limited due to dementia. All extremities well. PSYCHIATRIC:  patient is alert but has dementia SKIN: No obvious rash, lesion, or ulcer.   LABORATORY PANEL:  CBC Recent Labs  Lab 04/18/18 0532  WBC 8.6  HGB 8.4*  HCT 24.5*  PLT 75*    Chemistries  Recent Labs  Lab 04/12/18 2053  04/15/18 0531  04/18/18 0532  NA 131*   < > 134*   < > 138  K 4.4   < > 3.7   < > 4.2  CL 104   < > 114*   < > 113*  CO2 18*   < > 15*   < > 16*  GLUCOSE 162*   < > 107*   < > 142*  BUN 44*   < > 38*   < > 40*  CREATININE 1.28*   < > 0.88   < > 1.14*  CALCIUM 9.7   < > 7.9*   < > 7.7*  MG  --   --  1.7  --   --   AST 37  --   --   --   --   ALT 20  --   --   --   --   ALKPHOS 39  --   --   --   --   BILITOT  1.6*  --   --   --   --    < > = values in this interval not displayed.   Cardiac Enzymes Recent Labs  Lab 04/15/18 2233  TROPONINI 0.37*   RADIOLOGY:  Dg Chest 1 View  Result Date: 04/18/2018 CLINICAL DATA:  Hypoxic, worsening SOB EXAM: CHEST  1 VIEW COMPARISON:  Chest x-rays dated 04/16/2018, 04/12/2018 and 12/09/2017 FINDINGS: Cardiomegaly is stable compared to the most recent chest x-rays, enlarged compared to earlier exams. Atherosclerotic changes noted at the aortic arch. RIGHT-sided PICC line is stable in position with tip at the level of the mid/upper SVC. Persistent opacity at the LEFT lung base, compatible with moderate-sized layering pleural effusion and probable associated perihilar edema or atelectasis. The pleural effusion is at least mildly larger compared to most recent chest x-ray of 04/16/2018. Smaller pleural effusion at the RIGHT lung base. No pneumothorax seen. IMPRESSION: 1. LEFT-sided pleural  effusion, at least moderate in size, slightly larger compared to most recent chest x-ray of 04/16/2018. Probable associated edema and/or atelectasis in the LEFT perihilar lung. 2. Small RIGHT pleural effusion. 3. Cardiomegaly is stable compared to most recent chest x-rays, but has enlarged compared to earlier exams. Consider ECHO to exclude associated pericardial effusion. 4. Aortic atherosclerosis. These results will be called to the ordering clinician or representative by the Radiologist Assistant, and communication documented in the PACS or zVision Dashboard. Electronically Signed   By: Franki Cabot M.D.   On: 04/18/2018 11:11   Dg Chest 1 View  Result Date: 04/16/2018 CLINICAL DATA:  Sepsis. EXAM: CHEST  1 VIEW COMPARISON:  Radiograph of Apr 12, 2018. FINDINGS: Stable cardiomegaly is noted. Atherosclerosis of thoracic aorta is noted. No pneumothorax is noted. Mild bibasilar edema or atelectasis is noted with mild pleural effusions. Bony thorax is unremarkable. IMPRESSION: Mild bibasilar edema or atelectasis is noted with mild pleural effusions. Aortic Atherosclerosis (ICD10-I70.0). Electronically Signed   By: Marijo Conception, M.D.   On: 04/16/2018 13:48   Dg Chest Port 1 View  Result Date: 04/16/2018 CLINICAL DATA:  PICC line placement. EXAM: PORTABLE CHEST 1 VIEW COMPARISON:  04/16/2018 FINDINGS: RIGHT-sided PICC line tip overlies the level of superior vena cava. There is no pneumothorax. Heart size is enlarged and stable. Opacity at the LEFT lung base obscures the hemidiaphragm, stable. Bilateral pleural effusions are stable. Bilateral apical pleural thickening is stable. There is dense atherosclerosis of the thoracic aorta. IMPRESSION: 1. Interval placement of RIGHT-sided PICC line, tip overlying superior vena cava. 2. Stable appearance of cardiomegaly, LEFT LOWER lobe opacity, and bilateral pleural effusions. Electronically Signed   By: Nolon Nations M.D.   On: 04/16/2018 16:19   ASSESSMENT AND  PLAN:  Chelsea Horne  is a 84 y.o. female with a known history of COPD, hypertension, recurrent urinary tract infections and breast cancer status post remote mastectomy.Patient was brought to emergency room for acute mental status change with increased lethargy and generalized weakness.  She was also noticed to be febrile at home.   1.MSSA sepsis, likely secondary to port infection.   -four out of four bottles positive for MSSA bacteria. -TTE showed no vegetation. No need for TEE per ID. Will need PICC line for IV abxs for 4 weeks -appreciate Dr. Holland Falling surgery s/p removal of port on 04/13/2018   -2.  Acute UTI -currently on cefazolin that should cover. Follow-up urine culture shows GNR Urine retention, Foley catheter placed.  Poor urine output since last night, bladder scan showed  minimal amount of urine.  Patient is getting IV fluids.   3.  Acute encephalopathy, multifactorial.   Baseline dementia, sepsis, metabolic acidosis  Patient opens eyes but does not talk much .  Spoke with daughter about ABG, chest x-ray results today, regarding left pleural effusion patient will get a small dose of Lasix, Ativan daughter said that she is going  watch her for another day, does not want her to put through some pain to proceed. 4.   Left pleural effusion likely secondary to atelectasis, edema: Start small dose Lasix and Ativan, continue oxygen, hold off on the IV fluids. 5.  Metabolic acidosis;   - 5. Elevated troponin in the setting of sepsis    -First troponin level is elevated at 0.37. - this is likely secondary to demand ischemia from sepsis.   - patient on aspirin.  6. History of dementia.,  Lethargic, seen by speech therapy, continue pured diet as tolerated with aspiration precautions.    7. History ofMDS -patient follows with Dr. Grayland Ormond.  As per DR Grayland Ormond She gets IV blood transfusions every now and then due to her MDS.,  Transfused 1 unit of packed RBC, supposed to get Procrit  injections, Patient hemoglobin is improved to 8.5 from 7.4, looking at her record hemoglobin is around 8.1.  . H/o tachyarrhythmia/ atrial fibrillation -start low dose BB -Echo EF 55%, patient not able to be on blood thinners because of anemia.  This is poorly discussed with patient's daughter about ABG, chest x-ray results, patient not eating, breathing fast with high heart rate, hypotension.  At this time continue antibiotics, treatment but she does not want any pain procedures.  I told her that we can give Ativan for her to help her with calming her down, consult palliative care next week. Case discussed with Care Management/Social Worker. Management plans discussed with the patient, family and they are in agreement.  CODE STATUS: DNR  DVT Prophylaxis: Lovenox  TOTAL TIME TAKING CARE OF THIS PATIENT: 35 minutes.  >50% time spent on counselling and coordination of care  POSSIBLE D/C IN few DAYS, DEPENDING ON CLINICAL CONDITION.  Note: This dictation was prepared with Dragon dictation along with smaller phrase technology. Any transcriptional errors that result from this process are unintentional.  [name] M.D on 04/18/2018 at 11:39 AM  Between 7am to 6pm - Pager - [phone]  After 6pm go to [email or web address] - password [email or web address] [facility]  Office  [phone]  CC: Primary care physician; Cletis Athens, MDPatient ID: [name], female   DOB: Sep 25, 1927, 84 y.o.   MRN: [id number]

## 2018-04-18 NOTE — Plan of Care (Signed)
  Problem: Education: Goal: Knowledge of General Education information will improve Outcome: Progressing   Problem: Pain Managment: Goal: General experience of comfort will improve Outcome: Progressing   Problem: Safety: Goal: Ability to remain free from injury will improve Outcome: Progressing   Problem: Skin Integrity: Goal: Risk for impaired skin integrity will decrease Outcome: Progressing   Problem: Fluid Volume: Goal: Hemodynamic stability will improve Outcome: Progressing   Problem: Clinical Measurements: Goal: Diagnostic test results will improve Outcome: Progressing Goal: Signs and symptoms of infection will decrease Outcome: Progressing

## 2018-04-18 NOTE — Progress Notes (Signed)
Patient cardiac rhythm/ EKG showing afib with RVR. Diltiazem ordered. Blood pressure taken and low. MD ordered 1068ml fluid bolus. Fluid bolus administered. Blood pressure taken. Based on blood pressure reading, diltiazem not given per MD. Per MD, continue to monitor.

## 2018-04-18 NOTE — Progress Notes (Deleted)
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  INTERVAL HISTORY: Patient returns to clinic today for repeat laboratory work and further evaluation.  She continues to have chronic weakness and fatigue, but since reinitiating Procrit her hemoglobin is and performance status have mildly improved.  She otherwise feels well.  She has no neurologic complaints. She denies any recent fevers, chills, night sweats, or weight loss.  She denies any chest pain or shortness of breath. She has no nausea, vomiting, constipation, or diarrhea. She has no melena or hematochezia. She has no urinary complaints.  Patient offers no further specific complaints today.  REVIEW OF SYSTEMS:   Review of Systems  Constitutional: Positive for malaise/fatigue. Negative for fever and weight loss.  Eyes: Negative for blurred vision, double vision and pain.  Respiratory: Negative.  Negative for cough and shortness of breath.   Cardiovascular: Negative.  Negative for chest pain and leg swelling.  Gastrointestinal: Negative.  Negative for abdominal pain, blood in stool, constipation, diarrhea, melena, nausea and vomiting.  Genitourinary: Negative.   Musculoskeletal: Negative.  Negative for falls and joint pain.  Skin: Negative.  Negative for rash.  Neurological: Positive for weakness. Negative for dizziness and sensory change.  Psychiatric/Behavioral: Positive for memory loss. The patient is not nervous/anxious and does not have insomnia.     As per HPI. Otherwise, a complete review of systems is negative.  PAST MEDICAL HISTORY: Past Medical History:  Diagnosis Date  . Breast cancer ([place])    mastectomy  . COPD (chronic  obstructive pulmonary disease) ([place])   . Heart murmur   . Hiatal hernia   . Hypertension   . UTI (lower urinary tract infection)   . UTI (urinary tract infection)    frequent in past    PAST SURGICAL HISTORY: Past Surgical History:  Procedure Laterality Date  . ABDOMINAL HYSTERECTOMY    . BONE MARROW ASPIRATION  2018  . FOOT SURGERY     for hammertoe  . MASTECTOMY Left    modified radical in August 1996  . PORT-A-CATH REMOVAL N/A 04/13/2018   Procedure: REMOVAL PORT-A-CATH;  Surgeon: Hadley Pen, DO;  Location: [facility] ORS;  Service: General;  Laterality: N/A;  . PORTACATH PLACEMENT N/A 12/09/2017   Procedure: INSERTION PORT-A-CATH;  Surgeon: Vickie Epley, MD;  Location: [facility] ORS;  Service: Vascular;  Laterality: N/A;    FAMILY HISTORY Family History  Problem Relation Age of Onset  . Cancer Brother        lung  . Hypertension Brother   . Anemia Brother   . Myelodysplastic syndrome Brother        ADVANCED DIRECTIVES:    HEALTH MAINTENANCE: Social History   Tobacco Use  . Smoking status: Current Some Day Smoker    Packs/day: 0.25    Types: Cigarettes  . Smokeless tobacco: Never Used  Substance Use Topics  . Alcohol use: No  . Drug use: No     Colonoscopy:  PAP:  Bone density:  Lipid panel:  Allergies  Allergen Reactions  . Nitrofurantoin Other (See Comments)  . Penicillins Itching    Has patient had a PCN reaction causing immediate rash, facial/tongue/throat swelling, SOB or lightheadedness with hypotension: Yes Has patient had a PCN reaction causing  severe rash involving mucus membranes or skin necrosis: Unknown Has patient had a PCN reaction that required hospitalization: Unknown Has patient had a PCN reaction occurring within the last 10 years: Yes If all of the above answers are "NO", then may proceed with Cephalosporin use.  . Sulfa Antibiotics Itching    No current facility-administered medications for this visit.    No current outpatient  medications on file.   Facility-Administered Medications Ordered in Other Visits  Medication Dose Route Frequency Provider Last Rate Last Dose  . 0.9 %  sodium chloride infusion   Intravenous Once Fritzi Mandes, MD      . acetaminophen (TYLENOL) tablet 650 mg  650 mg Oral Q6H PRN Amelia Jo, MD   650 mg at 04/14/18 0612   Or  . acetaminophen (TYLENOL) suppository 650 mg  650 mg Rectal Q6H PRN Amelia Jo, MD   650 mg at 04/12/18 2313  . aspirin EC tablet 81 mg  81 mg Oral Daily Fritzi Mandes, MD   81 mg at 04/15/18 1219  . calcium-vitamin D (OSCAL WITH D) 500-200 MG-UNIT per tablet 1 tablet  1 tablet Oral BID Amelia Jo, MD   1 tablet at 04/15/18 1219  . ceFAZolin (ANCEF) IVPB 1 g/50 mL premix  1 g Intravenous Q12H [name], MD   Stopped at 04/18/18 1036  . deferoxamine (DESFERAL) 1,000 mg in dextrose 5 % 250 mL infusion  1,000 mg Intravenous Once [name], MD      . diltiazem (CARDIZEM) injection 10 mg  10 mg Intravenous Once Lance Coon, MD      . docusate sodium (COLACE) capsule 100 mg  100 mg Oral BID Amelia Jo, MD   100 mg at 04/15/18 1219  . hydrALAZINE (APRESOLINE) injection 10 mg  10 mg Intravenous Q4H PRN Lance Coon, MD      . LORazepam (ATIVAN) injection 2 mg  2 mg Intravenous Once [name], MD      . megestrol (MEGACE) tablet 20 mg  20 mg Oral Daily Fritzi Mandes, MD   20 mg at 04/15/18 1220  . metoprolol tartrate (LOPRESSOR) tablet 25 mg  25 mg Oral BID Fritzi Mandes, MD   Stopped at 04/18/18 1000  . morphine 2 MG/ML injection 1 mg  1 mg Intravenous Q4H PRN Loletha Grayer, MD   1 mg at 04/18/18 2123  . multivitamin with minerals tablet 1 tablet  1 tablet Oral Daily Amelia Jo, MD   1 tablet at 04/15/18 1219  . ondansetron (ZOFRAN) tablet 4 mg  4 mg Oral Q6H PRN Amelia Jo, MD       Or  . ondansetron [facility]) injection 4 mg  4 mg Intravenous Q6H PRN Amelia Jo, MD      . pneumococcal 23 valent vaccine (PNU-IMMUNE) injection 0.5  mL  0.5 mL Intramuscular Tomorrow-1000 Amelia Jo, MD      . polycarbophil (FIBERCON) tablet 625 mg  625 mg Oral Daily Fritzi Mandes, MD   625 mg at 04/15/18 1220  . sodium chloride (OCEAN) 0.65 % nasal spray 1 spray  1 spray Each Nare PRN Amelia Jo, MD      . sodium chloride flush (NS) 0.9 % injection 10 mL  10 mL Intravenous PRN [name], MD   10 mL at 01/29/18 0839  . sodium chloride flush (NS) 0.9 % injection 10 mL  10 mL Intravenous PRN [name], MD   10 mL at 03/30/18 0844  . sodium chloride flush (NS) 0.9 %  injection 10-40 mL  10-40 mL Intracatheter PRN [name], MD      . traZODone (DESYREL) tablet 25 mg  25 mg Oral QHS PRN Amelia Jo, MD      . vitamin C (ASCORBIC ACID) tablet 1,000 mg  1,000 mg Oral Daily Amelia Jo, MD   1,000 mg at 04/15/18 1220    OBJECTIVE: There were no vitals filed for this visit.   There is no height or weight on file to calculate BMI.    ECOG FS:2 - Symptomatic, <50% confined to bed  General: Thin, no acute distress.  Sitting in a wheelchair.   Eyes: Pink conjunctiva, anicteric sclera. Lungs: Clear to auscultation bilaterally. Heart: Regular rate and rhythm. No rubs, murmurs, or gallops. Abdomen: Soft, nontender, nondistended. No organomegaly noted, normoactive bowel sounds. Musculoskeletal: No edema, cyanosis, or clubbing. Neuro: Alert, answering all questions appropriately. Cranial nerves grossly intact. Skin: No rashes or petechiae noted. Psych: Normal affect.   LAB RESULTS:  Lab Results  Component Value Date   NA 138 04/18/2018   K 4.2 04/18/2018   CL 113 (H) 04/18/2018   CO2 16 (L) 04/18/2018   GLUCOSE 142 (H) 04/18/2018   BUN 40 (H) 04/18/2018   CREATININE 1.14 (H) 04/18/2018   CALCIUM 7.7 (L) 04/18/2018   PROT 6.8 04/12/2018   ALBUMIN 3.8 04/12/2018   AST 37 04/12/2018   ALT 20 04/12/2018   ALKPHOS 39 04/12/2018   BILITOT 1.6 (H) 04/12/2018   GFRNONAA 41 (L) 04/18/2018   GFRAA 48 (L)  04/18/2018    Lab Results  Component Value Date   WBC 8.6 04/18/2018   NEUTROABS 8.2 (H) 04/12/2018   HGB 8.4 (L) 04/18/2018   HCT 24.5 (L) 04/18/2018   MCV 92.9 04/18/2018   PLT 75 (L) 04/18/2018   Lab Results  Component Value Date   FERRITIN 1,733 (H) 03/30/2018   Lab Results  Component Value Date   IRON 190 (H) 03/30/2018   TIBC 212 (L) 03/30/2018   IRONPCTSAT 90 (H) 03/30/2018      STUDIES: Dg Chest 1 View  Result Date: 04/18/2018 CLINICAL DATA:  Hypoxic, worsening SOB EXAM: CHEST  1 VIEW COMPARISON:  Chest x-rays dated 04/16/2018, 04/12/2018 and 12/09/2017 FINDINGS: Cardiomegaly is stable compared to the most recent chest x-rays, enlarged compared to earlier exams. Atherosclerotic changes noted at the aortic arch. RIGHT-sided PICC line is stable in position with tip at the level of the mid/upper SVC. Persistent opacity at the LEFT lung base, compatible with moderate-sized layering pleural effusion and probable associated perihilar edema or atelectasis. The pleural effusion is at least mildly larger compared to most recent chest x-ray of 04/16/2018. Smaller pleural effusion at the RIGHT lung base. No pneumothorax seen. IMPRESSION: 1. LEFT-sided pleural effusion, at least moderate in size, slightly larger compared to most recent chest x-ray of 04/16/2018. Probable associated edema and/or atelectasis in the LEFT perihilar lung. 2. Small RIGHT pleural effusion. 3. Cardiomegaly is stable compared to most recent chest x-rays, but has enlarged compared to earlier exams. Consider ECHO to exclude associated pericardial effusion. 4. Aortic atherosclerosis. These results will be called to the ordering clinician or representative by the Radiologist Assistant, and communication documented in the PACS or zVision Dashboard. Electronically Signed   By: Franki Cabot M.D.   On: 04/18/2018 11:11   Dg Chest 1 View  Result Date: 04/16/2018 CLINICAL DATA:  Sepsis. EXAM: CHEST  1 VIEW COMPARISON:   Radiograph of Apr 12, 2018. FINDINGS: Stable cardiomegaly is noted. Atherosclerosis  of thoracic aorta is noted. No pneumothorax is noted. Mild bibasilar edema or atelectasis is noted with mild pleural effusions. Bony thorax is unremarkable. IMPRESSION: Mild bibasilar edema or atelectasis is noted with mild pleural effusions. Aortic Atherosclerosis (ICD10-I70.0). Electronically Signed   By: Marijo Conception, M.D.   On: 04/16/2018 13:48   Dg Chest Port 1 View  Result Date: 04/16/2018 CLINICAL DATA:  PICC line placement. EXAM: PORTABLE CHEST 1 VIEW COMPARISON:  04/16/2018 FINDINGS: RIGHT-sided PICC line tip overlies the level of superior vena cava. There is no pneumothorax. Heart size is enlarged and stable. Opacity at the LEFT lung base obscures the hemidiaphragm, stable. Bilateral pleural effusions are stable. Bilateral apical pleural thickening is stable. There is dense atherosclerosis of the thoracic aorta. IMPRESSION: 1. Interval placement of RIGHT-sided PICC line, tip overlying superior vena cava. 2. Stable appearance of cardiomegaly, LEFT LOWER lobe opacity, and bilateral pleural effusions. Electronically Signed   By: Nolon Nations M.D.   On: 04/16/2018 16:19   Dg Chest Port 1 View  Result Date: 04/12/2018 CLINICAL DATA:  Fever EXAM: PORTABLE CHEST 1 VIEW COMPARISON:  January 01, 2018 FINDINGS: Port-A-Cath tip is in the superior vena cava. No pneumothorax. There is no appreciable edema or consolidation. The heart size and pulmonary vascularity are normal. No adenopathy. There is aortic atherosclerosis. IMPRESSION: No edema or consolidation.  There is aortic atherosclerosis. Aortic Atherosclerosis (ICD10-I70.0). Electronically Signed   By: Lowella Grip III M.D.   On: 04/12/2018 21:34   Dg Hip Unilat With Pelvis 2-3 Views Left  Result Date: 04/13/2018 CLINICAL DATA:  C/o left hip pain since last night; family states no injury; pain when palpitating her hip EXAM: DG HIP (WITH OR WITHOUT PELVIS)  2-3V LEFT COMPARISON:  None. FINDINGS: No fracture.  No bone lesion. Hip joints, SI joints and symphysis pubis are normally spaced and aligned. There is significant arthropathic change. Bones are demineralized. IMPRESSION: 1. No fracture, bone lesion or significant joint abnormality. Electronically Signed   By: Lajean Manes M.D.   On: 04/13/2018 14:25   [place] Ekg Site Rite  Result Date: 04/15/2018 If Site Rite image not attached, placement could not be confirmed due to current cardiac rhythm.  [place] Ekg Site Rite  Result Date: 04/15/2018 If Site Rite image not attached, placement could not be confirmed due to current cardiac rhythm.  [place] Ekg Site Rite  Result Date: 04/15/2018 If Site Rite image not attached, placement could not be confirmed due to current cardiac rhythm.   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  PLAN:    1. 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  Patient also noted increased blast count, but unclear how much.  She also was noted to have an increase in clonal plasma cells possibly indicating an additional plasma cell dyscrasia.  Patient's peripheral smear was reviewed by pathology did not reveal any blasts or myeloblasts.  End-of-life and hospice care was previously discussed, but patient is not ready to pursue this option.  Patient has developed antibodies in her blood making it difficult to find a match.  She does not require transfusion this week.  We will proceed with 40,000 using Procrit.  Return to clinic weekly for lab and Procrit if her hemoglobin remains below 10.0.  Patient will then return to clinic in 4 weeks for further evaluation.   2.  History of  breast cancer: No evidence of disease.  Patient's mammograms are ordered by her PCP. 3.  Hypertension: Patient's blood pressure is mildly elevated today. Continue  treatment per PCP. 4.  Leukopenia/neutropenia: Chronic and relatively unchanged.  Patient's white blood cell count is approximately her baseline.  Monitor. 5.  Thrombocytopenia: Platelet count remains decreased, but essentially stable.  Monitor. 6.  Elevated iron stores: Continue Desferal with transfusions.  Will draw a repeat iron stores with next blood draw.  Approximately 30 minutes was spent in discussion of which greater than 50% was consultation.  Patient expressed understanding and was in agreement with this plan. She also understands that She can call clinic at any time with any questions, concerns, or complaints.    [name], MD 04/18/18 9:58 PM

## 2018-04-18 NOTE — Progress Notes (Addendum)
PT Cancellation Note  Patient Details Name: Chelsea Horne MRN: [id number] DOB: September 19, 1927   Cancelled Treatment:     Chart reviewed and monitors observed prior to attempted session.  HR 130'-140's at rest in bed and eating breakfast.  Per PT protocols, will hold session at this time.  Will attempt again later this am as time allows if appropriate.   Chesley Noon 04/18/2018, 9:08 AM

## 2018-04-19 LAB — TYPE AND SCREEN
ABO/RH(D): O POS
Antibody Screen: POSITIVE
DAT, COMPLEMENT: NEGATIVE
DAT, IgG: POSITIVE
Donor AG Type: NEGATIVE
UNIT DIVISION: 0
UNIT DIVISION: 0
Unit division: 0

## 2018-04-19 LAB — BPAM RBC
BLOOD PRODUCT EXPIRATION DATE: 201906232359
BLOOD PRODUCT EXPIRATION DATE: 201906252359
Blood Product Expiration Date: 201906252359
ISSUE DATE / TIME: 201905301836
UNIT TYPE AND RH: 5100
Unit Type and Rh: 5100
Unit Type and Rh: 5100

## 2018-04-19 LAB — CULTURE, BLOOD (ROUTINE X 2)
CULTURE: NO GROWTH
Culture: NO GROWTH
Special Requests: ADEQUATE
Special Requests: ADEQUATE

## 2018-04-19 MED ORDER — LORAZEPAM 2 MG/ML IJ SOLN
0.5000 mg | INTRAMUSCULAR | Status: DC | PRN
  Administered 2018-04-19 – 2018-04-20 (×5): 0.5 mg via INTRAVENOUS
  Filled 2018-04-19 (×5): qty 1

## 2018-04-19 NOTE — Plan of Care (Signed)
  Problem: Education: Goal: Knowledge of General Education information will improve Outcome: Progressing   Problem: Pain Managment: Goal: General experience of comfort will improve Outcome: Progressing   Problem: Safety: Goal: Ability to remain free from injury will improve Outcome: Progressing   Problem: Skin Integrity: Goal: Risk for impaired skin integrity will decrease Outcome: Progressing   Problem: Fluid Volume: Goal: Hemodynamic stability will improve Outcome: Progressing   Problem: Clinical Measurements: Goal: Diagnostic test results will improve Outcome: Progressing Goal: Signs and symptoms of infection will decrease Outcome: Progressing   Problem: Coping: Goal: Ability to identify and develop effective coping behavior will improve Outcome: Progressing   Problem: Clinical Measurements: Goal: Quality of life will improve Outcome: Progressing   Problem: Respiratory: Goal: Verbalizations of increased ease of respirations will increase Outcome: Progressing   Problem: Role Relationship: Goal: Family's ability to cope with current situation will improve Outcome: Progressing Goal: Ability to verbalize concerns, feelings, and thoughts to partner or family member will improve Outcome: Progressing   Problem: Pain Management: Goal: Satisfaction with pain management regimen will improve Outcome: Progressing

## 2018-04-19 NOTE — Progress Notes (Signed)
Discussed with patient's daughter at length about her, patient is going downhill with not eating, decreased urine output, decreased mental status.  She told me that mother is getting worse declining since January with blood transfusions, Procrit injections, now she has put infection, MSSA bacteremia and she is declining despite aggressive treatment.  She understands, she told me that she does not want her mother to suffer and focus on comfort care.  She does not want antibiotics, blood work, fingersticks.  Wants to see if he can go to hospice home tomorrow.  Discontinue telemetry.  Use IV morphine, Ativan.  CODE STATUS DNR.  Will request case management evaluation for hospice home placement.

## 2018-04-19 NOTE — Progress Notes (Signed)
[facility] NAME: [facility]    MR#:  [id number]  DATE OF BIRTH:  01-15-1927  SUBJECTIVE:  Discussed with patient's daughter, daughter requested comfort measures. REVIEW OF SYSTEMS:   Review of Systems  Unable to perform ROS: Acuity of condition  Appears to be lethargic.   Tolerating Diet poor p.o. intake Tolerating PT:  DRUG ALLERGIES:   Allergies  Allergen Reactions  . Nitrofurantoin Other (See Comments)  . Penicillins Itching    Has patient had a PCN reaction causing immediate rash, facial/tongue/throat swelling, SOB or lightheadedness with hypotension: Yes Has patient had a PCN reaction causing severe rash involving mucus membranes or skin necrosis: Unknown Has patient had a PCN reaction that required hospitalization: Unknown Has patient had a PCN reaction occurring within the last 10 years: Yes If all of the above answers are "NO", then may proceed with Cephalosporin use.  . Sulfa Antibiotics Itching    VITALS:  Blood pressure (!) 144/75, pulse 77, temperature 97.8 F (36.6 C), temperature source Oral, resp. rate 16, height 5\' 8"  (1.727 m), weight 60 kg (132 lb 4.8 oz), SpO2 96 %.  PHYSICAL EXAMINATION:   Physical Exam  GENERAL:  84 y.o.-year-old patient lying in the bed , clinically dehydrated,, appears pale.  Poorly responsive and appears to be comfortable receiving small dose of Remeron only this morning. eYES: Pupils equal, round, reactive to light and accommodation. No scleral icterus.  HEENT: Head atraumatic, normocephalic. Oropharynx and nasopharynx clear.  NECK:  Supple, no jugular venous distention. No thyroid enlargement, no tenderness.  LUNGS: Normal breath sounds bilaterally, no wheezing, rales, rhonchi. No use of accessory muscles of respiration. Right upper chest port++ CARDIOVASCULAR: S1, S2 normal. No murmurs, rubs, or gallops.  ABDOMEN: Soft, nontender, nondistended. Bowel sounds  present. No organomegaly or mass.  EXTREMITIES: No cyanosis, clubbing or edema b/l.    NEUROLOGIC: Unable to do full neuro exam due to sedation.  PSYCHIATRIC:  patient is resting comfortably. SKIN: No obvious rash, lesion, or ulcer.   LABORATORY PANEL:  CBC Recent Labs  Lab 04/18/18 0532  WBC 8.6  HGB 8.4*  HCT 24.5*  PLT 75*    Chemistries  Recent Labs  Lab 04/12/18 2053  04/15/18 0531  04/18/18 0532  NA 131*   < > 134*   < > 138  K 4.4   < > 3.7   < > 4.2  CL 104   < > 114*   < > 113*  CO2 18*   < > 15*   < > 16*  GLUCOSE 162*   < > 107*   < > 142*  BUN 44*   < > 38*   < > 40*  CREATININE 1.28*   < > 0.88   < > 1.14*  CALCIUM 9.7   < > 7.9*   < > 7.7*  MG  --   --  1.7  --   --   AST 37  --   --   --   --   ALT 20  --   --   --   --   ALKPHOS 39  --   --   --   --   BILITOT 1.6*  --   --   --   --    < > = values in this interval not displayed.   Cardiac Enzymes Recent Labs  Lab 04/15/18 2233  TROPONINI 0.37*   RADIOLOGY:  Dg Chest 1 View  Result Date: 04/18/2018 CLINICAL DATA:  Hypoxic, worsening SOB EXAM: CHEST  1 VIEW COMPARISON:  Chest x-rays dated 04/16/2018, 04/12/2018 and 12/09/2017 FINDINGS: Cardiomegaly is stable compared to the most recent chest x-rays, enlarged compared to earlier exams. Atherosclerotic changes noted at the aortic arch. RIGHT-sided PICC line is stable in position with tip at the level of the mid/upper SVC. Persistent opacity at the LEFT lung base, compatible with moderate-sized layering pleural effusion and probable associated perihilar edema or atelectasis. The pleural effusion is at least mildly larger compared to most recent chest x-ray of 04/16/2018. Smaller pleural effusion at the RIGHT lung base. No pneumothorax seen. IMPRESSION: 1. LEFT-sided pleural effusion, at least moderate in size, slightly larger compared to most recent chest x-ray of 04/16/2018. Probable associated edema and/or atelectasis in the LEFT perihilar lung. 2. Small  RIGHT pleural effusion. 3. Cardiomegaly is stable compared to most recent chest x-rays, but has enlarged compared to earlier exams. Consider ECHO to exclude associated pericardial effusion. 4. Aortic atherosclerosis. These results will be called to the ordering clinician or representative by the Radiologist Assistant, and communication documented in the PACS or zVision Dashboard. Electronically Signed   By: Franki Cabot M.D.   On: 04/18/2018 11:11   ASSESSMENT AND PLAN:  [facility]  is a 84 y.o. female with a known history of COPD, hypertension, recurrent urinary tract infections and breast cancer status post remote mastectomy.Patient was brought to emergency room for acute mental status change with increased lethargy and generalized weakness.  She was also noticed to be febrile at home.   1.MSSA sepsis, likely secondary to port infection.   - #2 MDS #3 metabolic acidosis Declining mental status secondary to underlying MDS, declining performance and overall condition, daughter chose comfort measures which I agree.  Continue comfort care, daughter requesting hospice home placement so case management is consulted as she wanted to see if he can go to hospice home tomorrow. Spoke with patient daughter and also sister-in-law /    -pCase discussed with Care Management/Social Worker. Management plans discussed with the patient, family and they are in agreement.  CODE STATUS: DNR  DVT Prophylaxis: Lovenox  TOTAL TIME TAKING CARE OF THIS PATIENT: 35 minutes.  >50% time spent on counselling and coordination of care  POSSIBLE D/C IN few DAYS, DEPENDING ON CLINICAL CONDITION.  Note: This dictation was prepared with Dragon dictation along with smaller phrase technology. Any transcriptional errors that result from this process are unintentional.  [name] M.D on 04/19/2018 at 10:49 AM  Between 7am to 6pm - Pager - [phone]  After 6pm go to [email or web address] - password [email or web address]  [facility]  Office  [phone]  CC: Primary care physician; Cletis Athens, MDPatient ID: [facility], female   DOB: 1927/07/17, 84 y.o.   MRN: [id number]

## 2018-04-20 ENCOUNTER — Inpatient Hospital Stay: Payer: Medicare Other

## 2018-04-20 ENCOUNTER — Inpatient Hospital Stay: Payer: Medicare Other | Admitting: Oncology

## 2018-04-20 NOTE — Progress Notes (Signed)
Nutrition Brief Note  Patient identified to be seen for low Braden score. Chart reviewed. Patient has transitioned to comfort care.   No nutrition interventions warranted at this time. Please consult RD as needed.   Willey Blade, MS, [place], LDN Office: [phone] Pager: [phone] After Hours/Weekend Pager: [phone]

## 2018-04-20 NOTE — Progress Notes (Signed)
New hospice home referral received from [facility]. Patient is an 84 year old woman with a known history of Myelodysplastic syndrome, COPD, dementia,  breast cancer w/left mastectomy and HTN admitted to [facility] on 5/26 with sepsis and MSSA bacteremia.  She has been treated with IV antibiotics, but continued to decline, with very little oral intake, none in the last 24 hours and requiring IV lorazepam and IV morphine for symptom management. Patient's daughter met with attending physician Dr. Vianne Bulls and has chosen to focus on comfort with transfer to the hospice home. [place] met in the room with patient's daughter Gwenyth Ober to initiate education regarding hospice services, philosophy and team approach to care with good understanding voiced, Questions answered, consents signed. Patient information faxed to referral, report called to the hospice home. EMS notified for transport with a 4:30 pick up time. Hospital care team updated. Signed DNR in place in discharge packet. Thank you for the opportunity to be involved in the care of this patient and her family. Flo Shanks RN, BSN, [facility] liaison [phone]

## 2018-04-20 NOTE — Progress Notes (Signed)
PT Cancellation Note  Patient Details Name: Chelsea H[facility] MRN: [id number] DOB: 09-Apr-1927   Cancelled Treatment:    Reason Eval/Treat Not Completed: Other (comment). Per chart review, pt transitioned to comfort care and will discharge to [facility] today. Complete current PT orders.    Larae Grooms, PTA 04/20/2018, 1:11 PM

## 2018-04-20 NOTE — Clinical Social Work Note (Signed)
Patient will be discharged to [facility] today. CSW notified RN and daughter. Santiago Glad, hospice liaison will set up transport and call report.   [place], [place]

## 2018-04-20 NOTE — Care Management Important Message (Signed)
Important Message  Patient Details  Name: Chelsea H[facility] MRN: [id number] Date of Birth: 1926-12-19   Medicare Important Message Given:  Yes    Juliann Pulse A Madoc Holquin 04/20/2018, 10:45 AM

## 2018-04-20 NOTE — Clinical Social Work Note (Signed)
CSW found through chart review that patient and family are interested in [facility] in [place]. CSW notified Santiago Glad with [facility]. CSW notified [facility] that the discharge disposition has changed. CSW will continue to follow for discharge planning.   Annamaria Boots MSW, [place] [phone]

## 2018-04-20 NOTE — Discharge Summary (Signed)
Chelsea Horne, is a 84 y.o. female  DOB 01/12/1927  MRN [id number].  Admission date:  04/12/2018  Admitting Physician  Amelia Jo, MD  Discharge Date:  04/20/2018   Primary MD  Cletis Athens, MD  Recommendations for primary care physician for things to follow:  Discharge to hospice home   Admission Diagnosis  Lower urinary tract infectious disease [N39.0] Sepsis, due to unspecified organism [facility]) [A41.9]   Discharge Diagnosis  Lower urinary tract infectious disease [N39.0] Sepsis, due to unspecified organism [facility]) [A41.9]    Active Problems:   Sepsis [facility])      Past Medical History:  Diagnosis Date  . Breast cancer ([place])    mastectomy  . COPD (chronic obstructive pulmonary disease) ([place])   . Heart murmur   . Hiatal hernia   . Hypertension   . UTI (lower urinary tract infection)   . UTI (urinary tract infection)    frequent in past    Past Surgical History:  Procedure Laterality Date  . ABDOMINAL HYSTERECTOMY    . BONE MARROW ASPIRATION  2018  . FOOT SURGERY     for hammertoe  . MASTECTOMY Left    modified radical in August 1996  . PORT-A-CATH REMOVAL N/A 04/13/2018   Procedure: REMOVAL PORT-A-CATH;  Surgeon: Hadley Pen, DO;  Location: [facility] ORS;  Service: General;  Laterality: N/A;  . PORTACATH PLACEMENT N/A 12/09/2017   Procedure: INSERTION PORT-A-CATH;  Surgeon: Vickie Epley, MD;  Location: [facility] ORS;  Service: Vascular;  Laterality: N/A;       History of present illness and  Hospital Course:     Kindly see H&P for history of present illness and admission details, please review complete Labs, Consult reports and Test reports for all details in brief  HPI  from the history and physical done on the day of admission -year-old female with dementia, myelodysplastic syndrome admitted for  generalized weakness, found to have sepsis, MSSA bacteremia.  Temperature is 102 Fahrenheit.   Hospital Course  Sepsis with septic shock on admission with tachycardia with heart rate 120, BP 90/53,.  Positive for UTI.  Patient admitted for septic shock with lactic acid 1.28 on admission.  Initially sepsis thought to be secondary to UTI but later on found out that blood cultures positive for MSSA, patient has port for blood transfusions for her history of mild dysplastic syndrome.  Patient "has been taken out by surgery because of bacteremia.  Seen by Dr. Berneta Sages from ID, recommended PICC line insertion, 28 days of IV antibiotics.  But patient condition decline despite aggressive therapy, family chose comfort measures, patient is going to hospice home today. #3 acute encephalopathy secondary to baseline dementia, sepsis, metabolic acidosis: Patient received IV hydration, bicarb drip, we also did blood gas.  Patient daughter requested that she be comfortable instead of being invasive procedures.  Started on morphine, Ativan after discussing with her extensively.  daughter chose comfort measures which I support her due to her underlying myelodysplastic syndrome, now with sepsis prognosis is poor. #4 history of tachyarrhythmia, A. fib, EF 55%.  Patient initially received low-dose beta-blocker Focus on comfort measures with morphine, Ativan at this time.  We discontinued all her p.o. medicines at because she is comfort care and we discussed this with patient's daughter.  Discharge Condition: Stable   Follow UP      Discharge Instructions  and  Discharge Medications      Allergies as of 04/20/2018      Reactions  Nitrofurantoin Other (See Comments)   Penicillins Itching   Has patient had a PCN reaction causing immediate rash, facial/tongue/throat swelling, SOB or lightheadedness with hypotension: Yes Has patient had a PCN reaction causing severe rash involving mucus membranes or skin necrosis:  Unknown Has patient had a PCN reaction that required hospitalization: Unknown Has patient had a PCN reaction occurring within the last 10 years: Yes If all of the above answers are "NO", then may proceed with Cephalosporin use.   Sulfa Antibiotics Itching      Medication List    STOP taking these medications   alendronate 70 MG tablet Commonly known as:  FOSAMAX   aspirin 81 MG tablet   calcium-vitamin D 250-125 MG-UNIT tablet Commonly known as:  OSCAL WITH D   gabapentin 100 MG capsule Commonly known as:  NEURONTIN   JOINT HEALTH MINERAL PO   meclizine 25 MG tablet Commonly known as:  ANTIVERT   megestrol 40 MG tablet Commonly known as:  MEGACE   multivitamin tablet   psyllium 0.52 g capsule Commonly known as:  REGULOID   RA ACETAMINOPHEN 650 MG CR tablet Generic drug:  acetaminophen   sodium chloride 0.65 % Soln nasal spray Commonly known as:  OCEAN   valsartan-hydrochlorothiazide 80-12.5 MG tablet Commonly known as:  DIOVAN-HCT   vitamin C 1000 MG tablet         Diet and Activity recommendation: See Discharge Instructions above   Consults obtained -ID, surgery, management   Major procedures and Radiology Reports - PLEASE review detailed and final reports for all details, in brief -      Dg Chest 1 View  Result Date: 04/18/2018 CLINICAL DATA:  Hypoxic, worsening SOB EXAM: CHEST  1 VIEW COMPARISON:  Chest x-rays dated 04/16/2018, 04/12/2018 and 12/09/2017 FINDINGS: Cardiomegaly is stable compared to the most recent chest x-rays, enlarged compared to earlier exams. Atherosclerotic changes noted at the aortic arch. RIGHT-sided PICC line is stable in position with tip at the level of the mid/upper SVC. Persistent opacity at the LEFT lung base, compatible with moderate-sized layering pleural effusion and probable associated perihilar edema or atelectasis. The pleural effusion is at least mildly larger compared to most recent chest x-ray of 04/16/2018.  Smaller pleural effusion at the RIGHT lung base. No pneumothorax seen. IMPRESSION: 1. LEFT-sided pleural effusion, at least moderate in size, slightly larger compared to most recent chest x-ray of 04/16/2018. Probable associated edema and/or atelectasis in the LEFT perihilar lung. 2. Small RIGHT pleural effusion. 3. Cardiomegaly is stable compared to most recent chest x-rays, but has enlarged compared to earlier exams. Consider ECHO to exclude associated pericardial effusion. 4. Aortic atherosclerosis. These results will be called to the ordering clinician or representative by the Radiologist Assistant, and communication documented in the PACS or zVision Dashboard. Electronically Signed   By: Franki Cabot M.D.   On: 04/18/2018 11:11   Dg Chest 1 View  Result Date: 04/16/2018 CLINICAL DATA:  Sepsis. EXAM: CHEST  1 VIEW COMPARISON:  Radiograph of Apr 12, 2018. FINDINGS: Stable cardiomegaly is noted. Atherosclerosis of thoracic aorta is noted. No pneumothorax is noted. Mild bibasilar edema or atelectasis is noted with mild pleural effusions. Bony thorax is unremarkable. IMPRESSION: Mild bibasilar edema or atelectasis is noted with mild pleural effusions. Aortic Atherosclerosis (ICD10-I70.0). Electronically Signed   By: Marijo Conception, M.D.   On: 04/16/2018 13:48   Dg Chest Port 1 View  Result Date: 04/16/2018 CLINICAL DATA:  PICC line placement. EXAM: PORTABLE CHEST 1 VIEW  COMPARISON:  04/16/2018 FINDINGS: RIGHT-sided PICC line tip overlies the level of superior vena cava. There is no pneumothorax. Heart size is enlarged and stable. Opacity at the LEFT lung base obscures the hemidiaphragm, stable. Bilateral pleural effusions are stable. Bilateral apical pleural thickening is stable. There is dense atherosclerosis of the thoracic aorta. IMPRESSION: 1. Interval placement of RIGHT-sided PICC line, tip overlying superior vena cava. 2. Stable appearance of cardiomegaly, LEFT LOWER lobe opacity, and bilateral  pleural effusions. Electronically Signed   By: Nolon Nations M.D.   On: 04/16/2018 16:19   Dg Chest Port 1 View  Result Date: 04/12/2018 CLINICAL DATA:  Fever EXAM: PORTABLE CHEST 1 VIEW COMPARISON:  January 01, 2018 FINDINGS: Port-A-Cath tip is in the superior vena cava. No pneumothorax. There is no appreciable edema or consolidation. The heart size and pulmonary vascularity are normal. No adenopathy. There is aortic atherosclerosis. IMPRESSION: No edema or consolidation.  There is aortic atherosclerosis. Aortic Atherosclerosis (ICD10-I70.0). Electronically Signed   By: Lowella Grip III M.D.   On: 04/12/2018 21:34   Dg Hip Unilat With Pelvis 2-3 Views Left  Result Date: 04/13/2018 CLINICAL DATA:  C/o left hip pain since last night; family states no injury; pain when palpitating her hip EXAM: DG HIP (WITH OR WITHOUT PELVIS) 2-3V LEFT COMPARISON:  None. FINDINGS: No fracture.  No bone lesion. Hip joints, SI joints and symphysis pubis are normally spaced and aligned. There is significant arthropathic change. Bones are demineralized. IMPRESSION: 1. No fracture, bone lesion or significant joint abnormality. Electronically Signed   By: Lajean Manes M.D.   On: 04/13/2018 14:25   [place] Ekg Site Rite  Result Date: 04/15/2018 If Site Rite image not attached, placement could not be confirmed due to current cardiac rhythm.  [place] Ekg Site Rite  Result Date: 04/15/2018 If Site Rite image not attached, placement could not be confirmed due to current cardiac rhythm.  [place] Ekg Site Rite  Result Date: 04/15/2018 If Site Rite image not attached, placement could not be confirmed due to current cardiac rhythm.   Micro Results     Recent Results (from the past 240 hour(s))  Urine culture     Status: Abnormal   Collection Time: 04/12/18  8:50 PM  Result Value Ref Range Status   Specimen Description   Final    URINE, CATHETERIZED Performed at [facility], [address] [place], [place]  [address]    Special Requests   Final    NONE Performed at [facility], [place], [place], [place] [address]    Culture >=100,000 COLONIES/mL KLEBSIELLA PNEUMONIAE (A)  Final   Report Status 04/15/2018 FINAL  Final   Organism ID, Bacteria KLEBSIELLA PNEUMONIAE (A)  Final      Susceptibility   Klebsiella pneumoniae - MIC*    AMPICILLIN >=32 RESISTANT Resistant     CEFAZOLIN <=4 SENSITIVE Sensitive     CEFTRIAXONE <=1 SENSITIVE Sensitive     CIPROFLOXACIN 0.5 SENSITIVE Sensitive     GENTAMICIN <=1 SENSITIVE Sensitive     IMIPENEM <=0.25 SENSITIVE Sensitive     NITROFURANTOIN 32 SENSITIVE Sensitive     TRIMETH/SULFA <=20 SENSITIVE Sensitive     AMPICILLIN/SULBACTAM 4 SENSITIVE Sensitive     PIP/TAZO <=4 SENSITIVE Sensitive     Extended ESBL NEGATIVE Sensitive     * >=100,000 COLONIES/mL KLEBSIELLA PNEUMONIAE  Blood Culture (routine x 2)     Status: Abnormal   Collection Time: 04/12/18  8:53 PM  Result Value Ref  Range Status   Specimen Description   Final    BLOOD PORTA CATH Performed at [facility], [address], [place], [place] [address]    Special Requests   Final    BOTTLES DRAWN AEROBIC AND ANAEROBIC Blood Culture adequate volume Performed at [facility], [address] [place], [place] [address]    Culture  Setup Time   Final    GRAM POSITIVE COCCI IN CLUSTERS IN BOTH AEROBIC AND ANAEROBIC BOTTLES CRITICAL RESULT CALLED TO, READ BACK BY AND VERIFIED WITH: RODNEY GRUBB AT 4008 ON 04/13/18 BY SNJ Performed at [facility], [place] [address] [place], [place] [address]    Culture STAPHYLOCOCCUS AUREUS (A)  Final   Report Status 04/15/2018 FINAL  Final   Organism ID, Bacteria STAPHYLOCOCCUS AUREUS  Final      Susceptibility   Staphylococcus aureus - MIC*    CIPROFLOXACIN <=0.5 SENSITIVE Sensitive     ERYTHROMYCIN <=0.25 SENSITIVE Sensitive     GENTAMICIN <=0.5 SENSITIVE Sensitive     OXACILLIN <=0.25 SENSITIVE Sensitive      TETRACYCLINE <=1 SENSITIVE Sensitive     VANCOMYCIN 1 SENSITIVE Sensitive     TRIMETH/SULFA <=10 SENSITIVE Sensitive     CLINDAMYCIN <=0.25 SENSITIVE Sensitive     RIFAMPIN <=0.5 SENSITIVE Sensitive     Inducible Clindamycin NEGATIVE Sensitive     * STAPHYLOCOCCUS AUREUS  Blood Culture ID Panel (Reflexed)     Status: Abnormal   Collection Time: 04/12/18  8:53 PM  Result Value Ref Range Status   Enterococcus species NOT DETECTED NOT DETECTED Final   Listeria monocytogenes NOT DETECTED NOT DETECTED Final   Staphylococcus species DETECTED (A) NOT DETECTED Final    Comment: CRITICAL RESULT CALLED TO, READ BACK BY AND VERIFIED WITH: RODNEY GRUBB AT 0729 ON 04/13/18 BY SNJ    Staphylococcus aureus DETECTED (A) NOT DETECTED Final    Comment: Methicillin (oxacillin) susceptible Staphylococcus aureus (MSSA). Preferred therapy is anti staphylococcal beta lactam antibiotic (Cefazolin or Nafcillin), unless clinically contraindicated. CRITICAL RESULT CALLED TO, READ BACK BY AND VERIFIED WITH: RODNEY GRUBB AT 5093 ON 04/13/18 BY SNJ    Methicillin resistance NOT DETECTED NOT DETECTED Final   Streptococcus species NOT DETECTED NOT DETECTED Final   Streptococcus agalactiae NOT DETECTED NOT DETECTED Final   Streptococcus pneumoniae NOT DETECTED NOT DETECTED Final   Streptococcus pyogenes NOT DETECTED NOT DETECTED Final   Acinetobacter baumannii NOT DETECTED NOT DETECTED Final   Enterobacteriaceae species NOT DETECTED NOT DETECTED Final   Enterobacter cloacae complex NOT DETECTED NOT DETECTED Final   Escherichia coli NOT DETECTED NOT DETECTED Final   Klebsiella oxytoca NOT DETECTED NOT DETECTED Final   Klebsiella pneumoniae NOT DETECTED NOT DETECTED Final   Proteus species NOT DETECTED NOT DETECTED Final   Serratia marcescens NOT DETECTED NOT DETECTED Final   Haemophilus influenzae NOT DETECTED NOT DETECTED Final   Neisseria meningitidis NOT DETECTED NOT DETECTED Final   Pseudomonas aeruginosa NOT  DETECTED NOT DETECTED Final   Candida albicans NOT DETECTED NOT DETECTED Final   Candida glabrata NOT DETECTED NOT DETECTED Final   Candida krusei NOT DETECTED NOT DETECTED Final   Candida parapsilosis NOT DETECTED NOT DETECTED Final   Candida tropicalis NOT DETECTED NOT DETECTED Final    Comment: Performed at [facility], [address] [place], [place] [address]  Blood Culture (routine x 2)     Status: Abnormal   Collection Time: 04/12/18  8:54 PM  Result Value Ref Range Status  Specimen Description   Final    BLOOD RIGHT ANTECUBITAL Performed at [facility], [address] [place], [place] [address]    Special Requests   Final    BOTTLES DRAWN AEROBIC AND ANAEROBIC Blood Culture adequate volume Performed at [facility], [address] [place], [place] [address]    Culture  Setup Time   Final    GRAM POSITIVE COCCI IN CLUSTERS IN BOTH AEROBIC AND ANAEROBIC BOTTLES CRITICAL VALUE NOTED.  VALUE IS CONSISTENT WITH PREVIOUSLY REPORTED AND CALLED VALUE. Performed at [facility], [address] [place], [place] [address]    Culture (A)  Final    STAPHYLOCOCCUS AUREUS SUSCEPTIBILITIES PERFORMED ON PREVIOUS CULTURE WITHIN THE LAST 5 DAYS. Performed at [facility], [place] [address], [place], [place] [address]    Report Status 04/15/2018 FINAL  Final  Cath Tip Culture     Status: None   Collection Time: 04/13/18  4:59 PM  Result Value Ref Range Status   Specimen Description   Final    CATH TIP Performed at [facility], [address], [place], [place] [address]    Special Requests   Final    NONE Performed at [facility], [address] [place], [place] [address]    Culture   Final    NO GROWTH 3 DAYS Performed at [facility], [place] [address] [place], [place] [address]    Report Status 04/17/2018 FINAL  Final  CULTURE, BLOOD (ROUTINE X 2) w Reflex to ID Panel     Status: None   Collection Time:  04/14/18  7:41 AM  Result Value Ref Range Status   Specimen Description BLOOD BLOOD RIGHT WRIST  Final   Special Requests   Final    BOTTLES DRAWN AEROBIC AND ANAEROBIC Blood Culture adequate volume   Culture   Final    NO GROWTH 5 DAYS Performed at [facility], [address] [place], [place] [address]    Report Status 04/19/2018 FINAL  Final  CULTURE, BLOOD (ROUTINE X 2) w Reflex to ID Panel     Status: None   Collection Time: 04/14/18  8:00 AM  Result Value Ref Range Status   Specimen Description BLOOD BLOOD RIGHT HAND  Final   Special Requests   Final    BOTTLES DRAWN AEROBIC AND ANAEROBIC Blood Culture adequate volume   Culture   Final    NO GROWTH 5 DAYS Performed at [facility], [address] [place], [place] [address]    Report Status 04/19/2018 FINAL  Final       Today   Subjective:   [name] table for discharge to hospice home. Objective:   Blood pressure (!) 87/36, pulse 65, temperature 97.8 F (36.6 C), temperature source Oral, resp. rate 20, height 5\' 8"  (1.727 m), weight 60 kg (132 lb 4.8 oz), SpO2 (!) 83 %.   Intake/Output Summary (Last 24 hours) at 04/20/2018 1405 Last data filed at 04/20/2018 0622 Gross per 24 hour  Intake -  Output 550 ml  Net -550 ml    Exam Unresponsive. .AT,PERRAL Supple Neck,No JVD, No cervical lymphadenopathy appriciated.  Symmetrical Chest wall movement, Good air movement bilaterally, CTAB RRR,No Gallops,Rubs or new Murmurs, No Parasternal Heave +ve B.Sounds, Abd Soft, Non tender, No organomegaly appriciated, No rebound -guarding or rigidity. No Cyanosis, Clubbing or edema, No new Rash or bruise  Data Review   CBC w Diff:  Lab Results  Component Value Date   WBC 8.6 04/18/2018   HGB 8.4 (L) 04/18/2018   HGB 7.3 (L) 03/01/2015   HCT 24.5 (L) 04/18/2018   HCT 21.6 (L) 03/01/2015   PLT 75 (L) 04/18/2018   PLT 166 03/01/2015   LYMPHOPCT 5 04/12/2018   LYMPHOPCT 47.0 03/01/2015    BANDSPCT 0 01/04/2018   MONOPCT 2 04/12/2018   MONOPCT 5.4 03/01/2015   EOSPCT 0 04/12/2018   EOSPCT 0.2 03/01/2015   BASOPCT 0 04/12/2018   BASOPCT 0.4 03/01/2015    CMP:  Lab Results  Component Value Date   NA 138 04/18/2018   NA 129 (L) 06/10/2014   K 4.2 04/18/2018   K 4.3 06/10/2014   CL 113 (H) 04/18/2018   CL 96 (L) 06/10/2014   CO2 16 (L) 04/18/2018   CO2 23 06/10/2014   BUN 40 (H) 04/18/2018   BUN 22 (H) 06/10/2014   CREATININE 1.14 (H) 04/18/2018   CREATININE 1.24 06/10/2014   PROT 6.8 04/12/2018   PROT 7.1 06/10/2014   ALBUMIN 3.8 04/12/2018   ALBUMIN 2.3 (L) 06/10/2014   BILITOT 1.6 (H) 04/12/2018   BILITOT 0.4 06/10/2014   ALKPHOS 39 04/12/2018   ALKPHOS 70 06/10/2014   AST 37 04/12/2018   AST 11 (L) 06/10/2014   ALT 20 04/12/2018   ALT 13 (L) 06/10/2014  .   Total Time in preparing paper work, data evaluation and todays exam - 26 minutes  [name] M.D on 04/20/2018 at 2:05 PM    Note: This dictation was prepared with Dragon dictation along with smaller phrase technology. Any transcriptional errors that result from this process are unintentional.

## 2018-05-18 DEATH — deceased

## 2018-06-02 NOTE — Telephone Encounter (Signed)
Erroneous entry
